# Patient Record
Sex: Female | Born: 1945
Health system: Southern US, Community
[De-identification: ages and names within clinical notes are randomized; demographics above are authoritative.]

## PROBLEM LIST (undated history)

## (undated) DIAGNOSIS — M791 Myalgia, unspecified site: Secondary | ICD-10-CM

## (undated) DIAGNOSIS — E079 Disorder of thyroid, unspecified: Secondary | ICD-10-CM

## (undated) DIAGNOSIS — I639 Cerebral infarction, unspecified: Secondary | ICD-10-CM

## (undated) DIAGNOSIS — E785 Hyperlipidemia, unspecified: Secondary | ICD-10-CM

## (undated) DIAGNOSIS — M069 Rheumatoid arthritis, unspecified: Secondary | ICD-10-CM

## (undated) DIAGNOSIS — F32A Depression, unspecified: Secondary | ICD-10-CM

## (undated) DIAGNOSIS — E538 Deficiency of other specified B group vitamins: Secondary | ICD-10-CM

## (undated) DIAGNOSIS — M359 Systemic involvement of connective tissue, unspecified: Secondary | ICD-10-CM

## (undated) DIAGNOSIS — F329 Major depressive disorder, single episode, unspecified: Secondary | ICD-10-CM

## (undated) DIAGNOSIS — G629 Polyneuropathy, unspecified: Secondary | ICD-10-CM

## (undated) DIAGNOSIS — I509 Heart failure, unspecified: Secondary | ICD-10-CM

## (undated) HISTORY — DX: Myalgia, unspecified site: M79.10

## (undated) HISTORY — DX: Depression, unspecified: F32.A

## (undated) HISTORY — PX: ECTOPIC PREGNANCY SURGERY: SHX613

## (undated) HISTORY — DX: Cerebral infarction, unspecified: I63.9

## (undated) HISTORY — DX: Deficiency of other specified B group vitamins: E53.8

## (undated) HISTORY — DX: Major depressive disorder, single episode, unspecified: F32.9

## (undated) HISTORY — DX: Rheumatoid arthritis, unspecified: M06.9

## (undated) HISTORY — PX: ABDOMINAL HYSTERECTOMY: SHX81

## (undated) HISTORY — PX: KNEE SURGERY: SHX244

## (undated) HISTORY — DX: Heart failure, unspecified: I50.9

## (undated) HISTORY — PX: LAMINECTOMY: SHX219

## (undated) HISTORY — PX: TONSILLECTOMY: SHX5217

## (undated) HISTORY — PX: BREAST BIOPSY: SHX20

## (undated) HISTORY — DX: Disorder of thyroid, unspecified: E07.9

## (undated) HISTORY — DX: Polyneuropathy, unspecified: G62.9

## (undated) HISTORY — PX: OTHER SURGICAL HISTORY: SHX169

---

## 2004-01-12 ENCOUNTER — Other Ambulatory Visit: Payer: Self-pay

## 2004-01-12 ENCOUNTER — Emergency Department: Payer: Self-pay | Admitting: Emergency Medicine

## 2004-05-08 ENCOUNTER — Ambulatory Visit: Payer: Self-pay | Admitting: Orthopedic Surgery

## 2004-05-17 ENCOUNTER — Ambulatory Visit: Payer: Self-pay | Admitting: Rheumatology

## 2004-07-17 ENCOUNTER — Ambulatory Visit: Payer: Self-pay | Admitting: Rheumatology

## 2004-11-02 ENCOUNTER — Ambulatory Visit: Payer: Self-pay | Admitting: Rheumatology

## 2005-01-01 ENCOUNTER — Ambulatory Visit: Payer: Self-pay | Admitting: Vascular Surgery

## 2005-02-01 ENCOUNTER — Ambulatory Visit: Payer: Self-pay

## 2005-08-28 ENCOUNTER — Ambulatory Visit: Payer: Self-pay | Admitting: Internal Medicine

## 2005-11-04 ENCOUNTER — Ambulatory Visit: Payer: Self-pay | Admitting: Otolaryngology

## 2006-02-27 ENCOUNTER — Ambulatory Visit: Payer: Self-pay | Admitting: Internal Medicine

## 2006-04-21 ENCOUNTER — Encounter: Payer: Self-pay | Admitting: Rheumatology

## 2006-05-02 ENCOUNTER — Encounter: Payer: Self-pay | Admitting: Rheumatology

## 2007-01-29 ENCOUNTER — Ambulatory Visit: Payer: Self-pay | Admitting: Rheumatology

## 2007-05-06 ENCOUNTER — Ambulatory Visit: Payer: Self-pay | Admitting: Internal Medicine

## 2007-11-20 ENCOUNTER — Ambulatory Visit: Payer: Self-pay | Admitting: Rheumatology

## 2008-01-18 ENCOUNTER — Ambulatory Visit: Payer: Self-pay | Admitting: Rheumatology

## 2008-02-26 ENCOUNTER — Ambulatory Visit: Payer: Self-pay | Admitting: Internal Medicine

## 2008-06-21 ENCOUNTER — Ambulatory Visit: Payer: Self-pay | Admitting: Rheumatology

## 2009-05-22 ENCOUNTER — Ambulatory Visit: Payer: Self-pay

## 2010-11-13 ENCOUNTER — Ambulatory Visit: Payer: Self-pay | Admitting: Internal Medicine

## 2010-11-15 ENCOUNTER — Ambulatory Visit: Payer: Self-pay | Admitting: Internal Medicine

## 2011-02-05 ENCOUNTER — Ambulatory Visit: Payer: Self-pay | Admitting: Internal Medicine

## 2011-05-27 ENCOUNTER — Ambulatory Visit: Payer: Self-pay | Admitting: General Surgery

## 2011-11-18 ENCOUNTER — Ambulatory Visit: Payer: Self-pay | Admitting: Internal Medicine

## 2013-06-07 ENCOUNTER — Ambulatory Visit: Payer: Self-pay | Admitting: Internal Medicine

## 2013-09-16 ENCOUNTER — Encounter: Payer: Self-pay | Admitting: Podiatry

## 2013-09-20 ENCOUNTER — Encounter: Payer: Self-pay | Admitting: Podiatry

## 2013-09-20 ENCOUNTER — Other Ambulatory Visit: Payer: Self-pay | Admitting: *Deleted

## 2013-09-20 ENCOUNTER — Ambulatory Visit (INDEPENDENT_AMBULATORY_CARE_PROVIDER_SITE_OTHER): Payer: Medicare Other | Admitting: Podiatry

## 2013-09-20 ENCOUNTER — Ambulatory Visit (INDEPENDENT_AMBULATORY_CARE_PROVIDER_SITE_OTHER): Payer: Medicare Other

## 2013-09-20 ENCOUNTER — Encounter: Payer: Self-pay | Admitting: *Deleted

## 2013-09-20 VITALS — BP 120/79 | HR 67 | Resp 16 | Ht 65.0 in | Wt 155.0 lb

## 2013-09-20 DIAGNOSIS — M21612 Bunion of left foot: Secondary | ICD-10-CM

## 2013-09-20 DIAGNOSIS — M778 Other enthesopathies, not elsewhere classified: Secondary | ICD-10-CM

## 2013-09-20 DIAGNOSIS — M779 Enthesopathy, unspecified: Secondary | ICD-10-CM

## 2013-09-20 DIAGNOSIS — M7750 Other enthesopathy of unspecified foot: Secondary | ICD-10-CM

## 2013-09-20 DIAGNOSIS — G5781 Other specified mononeuropathies of right lower limb: Secondary | ICD-10-CM

## 2013-09-20 DIAGNOSIS — G5760 Lesion of plantar nerve, unspecified lower limb: Secondary | ICD-10-CM

## 2013-09-20 DIAGNOSIS — M21619 Bunion of unspecified foot: Secondary | ICD-10-CM

## 2013-09-20 DIAGNOSIS — M21611 Bunion of right foot: Secondary | ICD-10-CM

## 2013-09-20 DIAGNOSIS — M059 Rheumatoid arthritis with rheumatoid factor, unspecified: Secondary | ICD-10-CM | POA: Insufficient documentation

## 2013-09-20 NOTE — Progress Notes (Signed)
She presents today complaining of bilateral foot pain. She states the right foot hurts right in here she points to the third and fourth toe and to the third interdigital space of the right foot. She's also stating that she has splaying between her toes and she states that she has pain on ambulation of the second knuckle a she points to the second metatarsophalangeal joint. She states that she's been treated by with Dr. Precious Reel full rheumatoid arthritis.  Objective: Vital signs are stable she is alert and oriented x3. Pulses are palpable bilateral. She has pain on a palpation to the third interdigital space of the right foot with a positive Mulder's click. This is associated with the neuroma. She also has a warm second metatarsophalangeal joint of the left foot with no erythema cellulitis drainage or odor associated with this. This very well could be a rheumatoid flare. She also has swelling in her ankles with mild erythema.  Assessment: Possible rheumatoid flare to the left foot second metatarsophalangeal joint. And bilateral ankles. She has a neuroma to the third interdigital space of the right foot.  Plan: Injected Kenalog into the third interdigital space of the right foot. Performed an extra-articular injection around the second metatarsophalangeal joint. Suggested that she followup with her rheumatologist immediately.

## 2013-11-01 ENCOUNTER — Ambulatory Visit (INDEPENDENT_AMBULATORY_CARE_PROVIDER_SITE_OTHER): Payer: Medicare Other | Admitting: Podiatry

## 2013-11-01 VITALS — BP 146/108 | HR 66 | Resp 16

## 2013-11-01 DIAGNOSIS — G5760 Lesion of plantar nerve, unspecified lower limb: Secondary | ICD-10-CM

## 2013-11-01 DIAGNOSIS — G5781 Other specified mononeuropathies of right lower limb: Secondary | ICD-10-CM

## 2013-11-01 NOTE — Progress Notes (Signed)
She presents today for followup of her foot pain bilateral. She states it seems to be doing much better.  Objective: Vital signs are stable she is alert and oriented x3. She is very prominent metatarsals with atrophy of the fat pad bilateral forefoot.  Assessment: Fat-pad atrophy metatarsalgia bilateral. Resolving neuritis and neuroma third interdigital space bilateral.  Plan: Followup with me as needed.

## 2013-11-25 DIAGNOSIS — R7303 Prediabetes: Secondary | ICD-10-CM | POA: Insufficient documentation

## 2013-11-25 DIAGNOSIS — R7309 Other abnormal glucose: Secondary | ICD-10-CM | POA: Insufficient documentation

## 2015-04-05 ENCOUNTER — Other Ambulatory Visit: Payer: Self-pay | Admitting: Internal Medicine

## 2015-04-05 DIAGNOSIS — Z1231 Encounter for screening mammogram for malignant neoplasm of breast: Secondary | ICD-10-CM

## 2015-04-05 DIAGNOSIS — E784 Other hyperlipidemia: Secondary | ICD-10-CM | POA: Diagnosis not present

## 2015-04-05 DIAGNOSIS — Z1211 Encounter for screening for malignant neoplasm of colon: Secondary | ICD-10-CM | POA: Diagnosis not present

## 2015-04-05 DIAGNOSIS — M150 Primary generalized (osteo)arthritis: Secondary | ICD-10-CM | POA: Diagnosis not present

## 2015-04-05 DIAGNOSIS — F3342 Major depressive disorder, recurrent, in full remission: Secondary | ICD-10-CM | POA: Diagnosis not present

## 2015-04-05 DIAGNOSIS — E034 Atrophy of thyroid (acquired): Secondary | ICD-10-CM | POA: Diagnosis not present

## 2015-04-05 DIAGNOSIS — M059 Rheumatoid arthritis with rheumatoid factor, unspecified: Secondary | ICD-10-CM | POA: Diagnosis not present

## 2015-04-05 DIAGNOSIS — Z1239 Encounter for other screening for malignant neoplasm of breast: Secondary | ICD-10-CM | POA: Diagnosis not present

## 2015-04-05 DIAGNOSIS — R7309 Other abnormal glucose: Secondary | ICD-10-CM | POA: Diagnosis not present

## 2015-04-13 ENCOUNTER — Ambulatory Visit: Payer: Self-pay

## 2015-04-21 DIAGNOSIS — Z1211 Encounter for screening for malignant neoplasm of colon: Secondary | ICD-10-CM | POA: Diagnosis not present

## 2015-05-08 DIAGNOSIS — R7309 Other abnormal glucose: Secondary | ICD-10-CM | POA: Diagnosis not present

## 2015-05-08 DIAGNOSIS — M150 Primary generalized (osteo)arthritis: Secondary | ICD-10-CM | POA: Diagnosis not present

## 2015-05-08 DIAGNOSIS — M0579 Rheumatoid arthritis with rheumatoid factor of multiple sites without organ or systems involvement: Secondary | ICD-10-CM | POA: Diagnosis not present

## 2015-05-08 DIAGNOSIS — G4733 Obstructive sleep apnea (adult) (pediatric): Secondary | ICD-10-CM | POA: Diagnosis not present

## 2015-05-08 DIAGNOSIS — F3342 Major depressive disorder, recurrent, in full remission: Secondary | ICD-10-CM | POA: Diagnosis not present

## 2015-05-08 DIAGNOSIS — E784 Other hyperlipidemia: Secondary | ICD-10-CM | POA: Diagnosis not present

## 2015-05-08 DIAGNOSIS — E034 Atrophy of thyroid (acquired): Secondary | ICD-10-CM | POA: Diagnosis not present

## 2015-05-10 ENCOUNTER — Ambulatory Visit
Admission: RE | Admit: 2015-05-10 | Discharge: 2015-05-10 | Disposition: A | Discharge status: Home / Self Care | Payer: PPO | Source: Ambulatory Visit | Attending: Internal Medicine | Admitting: Internal Medicine

## 2015-05-10 ENCOUNTER — Other Ambulatory Visit: Payer: Self-pay | Admitting: Internal Medicine

## 2015-05-10 DIAGNOSIS — Z1231 Encounter for screening mammogram for malignant neoplasm of breast: Secondary | ICD-10-CM | POA: Insufficient documentation

## 2015-05-24 DIAGNOSIS — I83811 Varicose veins of right lower extremities with pain: Secondary | ICD-10-CM | POA: Diagnosis not present

## 2015-05-24 DIAGNOSIS — M79609 Pain in unspecified limb: Secondary | ICD-10-CM | POA: Diagnosis not present

## 2015-05-24 DIAGNOSIS — I8311 Varicose veins of right lower extremity with inflammation: Secondary | ICD-10-CM | POA: Diagnosis not present

## 2015-05-25 DIAGNOSIS — M25561 Pain in right knee: Secondary | ICD-10-CM | POA: Diagnosis not present

## 2015-05-25 DIAGNOSIS — Z79899 Other long term (current) drug therapy: Secondary | ICD-10-CM | POA: Diagnosis not present

## 2015-05-25 DIAGNOSIS — G8929 Other chronic pain: Secondary | ICD-10-CM | POA: Diagnosis not present

## 2015-05-25 DIAGNOSIS — M0579 Rheumatoid arthritis with rheumatoid factor of multiple sites without organ or systems involvement: Secondary | ICD-10-CM | POA: Diagnosis not present

## 2015-06-20 DIAGNOSIS — M79609 Pain in unspecified limb: Secondary | ICD-10-CM | POA: Diagnosis not present

## 2015-06-20 DIAGNOSIS — I8311 Varicose veins of right lower extremity with inflammation: Secondary | ICD-10-CM | POA: Diagnosis not present

## 2015-06-20 DIAGNOSIS — I83811 Varicose veins of right lower extremities with pain: Secondary | ICD-10-CM | POA: Diagnosis not present

## 2015-07-21 DIAGNOSIS — M79609 Pain in unspecified limb: Secondary | ICD-10-CM | POA: Diagnosis not present

## 2015-07-21 DIAGNOSIS — I83811 Varicose veins of right lower extremities with pain: Secondary | ICD-10-CM | POA: Diagnosis not present

## 2015-07-21 DIAGNOSIS — I83813 Varicose veins of bilateral lower extremities with pain: Secondary | ICD-10-CM | POA: Diagnosis not present

## 2015-07-21 DIAGNOSIS — I8311 Varicose veins of right lower extremity with inflammation: Secondary | ICD-10-CM | POA: Diagnosis not present

## 2015-09-15 DIAGNOSIS — I8311 Varicose veins of right lower extremity with inflammation: Secondary | ICD-10-CM | POA: Diagnosis not present

## 2015-09-15 DIAGNOSIS — M79609 Pain in unspecified limb: Secondary | ICD-10-CM | POA: Diagnosis not present

## 2015-09-15 DIAGNOSIS — I83813 Varicose veins of bilateral lower extremities with pain: Secondary | ICD-10-CM | POA: Diagnosis not present

## 2015-09-15 DIAGNOSIS — I83811 Varicose veins of right lower extremities with pain: Secondary | ICD-10-CM | POA: Diagnosis not present

## 2015-10-27 DIAGNOSIS — E034 Atrophy of thyroid (acquired): Secondary | ICD-10-CM | POA: Diagnosis not present

## 2015-10-27 DIAGNOSIS — E784 Other hyperlipidemia: Secondary | ICD-10-CM | POA: Diagnosis not present

## 2015-10-27 DIAGNOSIS — M150 Primary generalized (osteo)arthritis: Secondary | ICD-10-CM | POA: Diagnosis not present

## 2015-11-03 DIAGNOSIS — M150 Primary generalized (osteo)arthritis: Secondary | ICD-10-CM | POA: Diagnosis not present

## 2015-11-03 DIAGNOSIS — G4733 Obstructive sleep apnea (adult) (pediatric): Secondary | ICD-10-CM | POA: Diagnosis not present

## 2015-11-03 DIAGNOSIS — M0579 Rheumatoid arthritis with rheumatoid factor of multiple sites without organ or systems involvement: Secondary | ICD-10-CM | POA: Diagnosis not present

## 2015-11-03 DIAGNOSIS — F3341 Major depressive disorder, recurrent, in partial remission: Secondary | ICD-10-CM | POA: Diagnosis not present

## 2015-11-03 DIAGNOSIS — E784 Other hyperlipidemia: Secondary | ICD-10-CM | POA: Diagnosis not present

## 2015-11-03 DIAGNOSIS — R7309 Other abnormal glucose: Secondary | ICD-10-CM | POA: Diagnosis not present

## 2015-11-03 DIAGNOSIS — E034 Atrophy of thyroid (acquired): Secondary | ICD-10-CM | POA: Diagnosis not present

## 2015-11-23 DIAGNOSIS — M65341 Trigger finger, right ring finger: Secondary | ICD-10-CM | POA: Diagnosis not present

## 2015-11-23 DIAGNOSIS — M0579 Rheumatoid arthritis with rheumatoid factor of multiple sites without organ or systems involvement: Secondary | ICD-10-CM | POA: Diagnosis not present

## 2015-11-23 DIAGNOSIS — Z79899 Other long term (current) drug therapy: Secondary | ICD-10-CM | POA: Diagnosis not present

## 2016-01-30 DIAGNOSIS — E034 Atrophy of thyroid (acquired): Secondary | ICD-10-CM | POA: Diagnosis not present

## 2016-01-30 DIAGNOSIS — R7309 Other abnormal glucose: Secondary | ICD-10-CM | POA: Diagnosis not present

## 2016-01-30 DIAGNOSIS — M0579 Rheumatoid arthritis with rheumatoid factor of multiple sites without organ or systems involvement: Secondary | ICD-10-CM | POA: Diagnosis not present

## 2016-01-30 DIAGNOSIS — R300 Dysuria: Secondary | ICD-10-CM | POA: Diagnosis not present

## 2016-01-30 DIAGNOSIS — M150 Primary generalized (osteo)arthritis: Secondary | ICD-10-CM | POA: Diagnosis not present

## 2016-01-30 DIAGNOSIS — N390 Urinary tract infection, site not specified: Secondary | ICD-10-CM | POA: Diagnosis not present

## 2016-01-30 DIAGNOSIS — R030 Elevated blood-pressure reading, without diagnosis of hypertension: Secondary | ICD-10-CM | POA: Diagnosis not present

## 2016-02-06 DIAGNOSIS — F3341 Major depressive disorder, recurrent, in partial remission: Secondary | ICD-10-CM | POA: Diagnosis not present

## 2016-02-06 DIAGNOSIS — Z23 Encounter for immunization: Secondary | ICD-10-CM | POA: Diagnosis not present

## 2016-02-06 DIAGNOSIS — E784 Other hyperlipidemia: Secondary | ICD-10-CM | POA: Diagnosis not present

## 2016-02-06 DIAGNOSIS — E034 Atrophy of thyroid (acquired): Secondary | ICD-10-CM | POA: Diagnosis not present

## 2016-02-06 DIAGNOSIS — G4733 Obstructive sleep apnea (adult) (pediatric): Secondary | ICD-10-CM | POA: Diagnosis not present

## 2016-02-06 DIAGNOSIS — R7309 Other abnormal glucose: Secondary | ICD-10-CM | POA: Diagnosis not present

## 2016-02-06 DIAGNOSIS — M150 Primary generalized (osteo)arthritis: Secondary | ICD-10-CM | POA: Diagnosis not present

## 2016-02-06 DIAGNOSIS — M0579 Rheumatoid arthritis with rheumatoid factor of multiple sites without organ or systems involvement: Secondary | ICD-10-CM | POA: Diagnosis not present

## 2016-04-22 DIAGNOSIS — E034 Atrophy of thyroid (acquired): Secondary | ICD-10-CM | POA: Diagnosis not present

## 2016-04-22 DIAGNOSIS — E784 Other hyperlipidemia: Secondary | ICD-10-CM | POA: Diagnosis not present

## 2016-04-22 DIAGNOSIS — G4733 Obstructive sleep apnea (adult) (pediatric): Secondary | ICD-10-CM | POA: Diagnosis not present

## 2016-04-22 DIAGNOSIS — M0579 Rheumatoid arthritis with rheumatoid factor of multiple sites without organ or systems involvement: Secondary | ICD-10-CM | POA: Diagnosis not present

## 2016-04-22 DIAGNOSIS — R7309 Other abnormal glucose: Secondary | ICD-10-CM | POA: Diagnosis not present

## 2016-05-01 ENCOUNTER — Other Ambulatory Visit: Payer: Self-pay | Admitting: Internal Medicine

## 2016-05-01 DIAGNOSIS — Z1231 Encounter for screening mammogram for malignant neoplasm of breast: Secondary | ICD-10-CM | POA: Diagnosis not present

## 2016-05-01 DIAGNOSIS — M0579 Rheumatoid arthritis with rheumatoid factor of multiple sites without organ or systems involvement: Secondary | ICD-10-CM | POA: Diagnosis not present

## 2016-05-01 DIAGNOSIS — R7309 Other abnormal glucose: Secondary | ICD-10-CM | POA: Diagnosis not present

## 2016-05-01 DIAGNOSIS — E784 Other hyperlipidemia: Secondary | ICD-10-CM | POA: Diagnosis not present

## 2016-05-01 DIAGNOSIS — E034 Atrophy of thyroid (acquired): Secondary | ICD-10-CM | POA: Diagnosis not present

## 2016-05-01 DIAGNOSIS — Z0000 Encounter for general adult medical examination without abnormal findings: Secondary | ICD-10-CM | POA: Diagnosis not present

## 2016-05-01 DIAGNOSIS — G4733 Obstructive sleep apnea (adult) (pediatric): Secondary | ICD-10-CM | POA: Diagnosis not present

## 2016-05-01 DIAGNOSIS — F3341 Major depressive disorder, recurrent, in partial remission: Secondary | ICD-10-CM | POA: Diagnosis not present

## 2016-05-01 DIAGNOSIS — M150 Primary generalized (osteo)arthritis: Secondary | ICD-10-CM | POA: Diagnosis not present

## 2016-06-11 ENCOUNTER — Ambulatory Visit: Payer: PPO

## 2016-07-17 ENCOUNTER — Emergency Department: Payer: PPO

## 2016-07-17 ENCOUNTER — Encounter: Payer: Self-pay | Admitting: Emergency Medicine

## 2016-07-17 ENCOUNTER — Inpatient Hospital Stay
Admission: EM | Admit: 2016-07-17 | Discharge: 2016-07-19 | DRG: 066 | Disposition: A | Discharge status: Home Health | Payer: PPO | Attending: Specialist | Admitting: Specialist

## 2016-07-17 DIAGNOSIS — I10 Essential (primary) hypertension: Secondary | ICD-10-CM | POA: Diagnosis not present

## 2016-07-17 DIAGNOSIS — F329 Major depressive disorder, single episode, unspecified: Secondary | ICD-10-CM | POA: Diagnosis not present

## 2016-07-17 DIAGNOSIS — Z90710 Acquired absence of both cervix and uterus: Secondary | ICD-10-CM | POA: Diagnosis not present

## 2016-07-17 DIAGNOSIS — I739 Peripheral vascular disease, unspecified: Secondary | ICD-10-CM | POA: Diagnosis present

## 2016-07-17 DIAGNOSIS — E785 Hyperlipidemia, unspecified: Secondary | ICD-10-CM | POA: Diagnosis present

## 2016-07-17 DIAGNOSIS — E039 Hypothyroidism, unspecified: Secondary | ICD-10-CM | POA: Diagnosis present

## 2016-07-17 DIAGNOSIS — Z7982 Long term (current) use of aspirin: Secondary | ICD-10-CM

## 2016-07-17 DIAGNOSIS — R29701 NIHSS score 1: Secondary | ICD-10-CM | POA: Diagnosis not present

## 2016-07-17 DIAGNOSIS — G629 Polyneuropathy, unspecified: Secondary | ICD-10-CM | POA: Diagnosis not present

## 2016-07-17 DIAGNOSIS — I639 Cerebral infarction, unspecified: Secondary | ICD-10-CM

## 2016-07-17 DIAGNOSIS — I6789 Other cerebrovascular disease: Secondary | ICD-10-CM | POA: Diagnosis not present

## 2016-07-17 DIAGNOSIS — R51 Headache: Secondary | ICD-10-CM | POA: Diagnosis not present

## 2016-07-17 DIAGNOSIS — Z23 Encounter for immunization: Secondary | ICD-10-CM

## 2016-07-17 DIAGNOSIS — M069 Rheumatoid arthritis, unspecified: Secondary | ICD-10-CM | POA: Diagnosis present

## 2016-07-17 DIAGNOSIS — E538 Deficiency of other specified B group vitamins: Secondary | ICD-10-CM | POA: Diagnosis not present

## 2016-07-17 DIAGNOSIS — I6350 Cerebral infarction due to unspecified occlusion or stenosis of unspecified cerebral artery: Principal | ICD-10-CM | POA: Diagnosis present

## 2016-07-17 DIAGNOSIS — R200 Anesthesia of skin: Secondary | ICD-10-CM | POA: Diagnosis not present

## 2016-07-17 DIAGNOSIS — Z885 Allergy status to narcotic agent status: Secondary | ICD-10-CM | POA: Diagnosis not present

## 2016-07-17 DIAGNOSIS — Z79899 Other long term (current) drug therapy: Secondary | ICD-10-CM | POA: Diagnosis not present

## 2016-07-17 DIAGNOSIS — F419 Anxiety disorder, unspecified: Secondary | ICD-10-CM | POA: Diagnosis present

## 2016-07-17 DIAGNOSIS — G8321 Monoplegia of upper limb affecting right dominant side: Secondary | ICD-10-CM | POA: Diagnosis present

## 2016-07-17 DIAGNOSIS — I63233 Cerebral infarction due to unspecified occlusion or stenosis of bilateral carotid arteries: Secondary | ICD-10-CM | POA: Diagnosis not present

## 2016-07-17 DIAGNOSIS — G459 Transient cerebral ischemic attack, unspecified: Secondary | ICD-10-CM | POA: Diagnosis not present

## 2016-07-17 HISTORY — DX: Hyperlipidemia, unspecified: E78.5

## 2016-07-17 LAB — CBC
HCT: 45.8 % (ref 35.0–47.0)
Hemoglobin: 15.2 g/dL (ref 12.0–16.0)
MCH: 31.5 pg (ref 26.0–34.0)
MCHC: 33.1 g/dL (ref 32.0–36.0)
MCV: 95.2 fL (ref 80.0–100.0)
PLATELETS: 294 10*3/uL (ref 150–440)
RBC: 4.81 MIL/uL (ref 3.80–5.20)
RDW: 14.5 % (ref 11.5–14.5)
WBC: 6.8 10*3/uL (ref 3.6–11.0)

## 2016-07-17 LAB — COMPREHENSIVE METABOLIC PANEL
ALBUMIN: 3.9 g/dL (ref 3.5–5.0)
ALK PHOS: 98 U/L (ref 38–126)
ALT: 10 U/L — ABNORMAL LOW (ref 14–54)
AST: 20 U/L (ref 15–41)
Anion gap: 9 (ref 5–15)
BILIRUBIN TOTAL: 0.7 mg/dL (ref 0.3–1.2)
BUN: 9 mg/dL (ref 6–20)
CALCIUM: 9.1 mg/dL (ref 8.9–10.3)
CO2: 24 mmol/L (ref 22–32)
CREATININE: 0.79 mg/dL (ref 0.44–1.00)
Chloride: 108 mmol/L (ref 101–111)
GFR calc Af Amer: >60 mL/min (ref >60)
GFR calc non Af Amer: >60 mL/min (ref >60)
GLUCOSE: 136 mg/dL — AB (ref 65–99)
Potassium: 3.4 mmol/L — ABNORMAL LOW (ref 3.5–5.1)
Sodium: 141 mmol/L (ref 135–145)
TOTAL PROTEIN: 7.5 g/dL (ref 6.5–8.1)

## 2016-07-17 LAB — PROTIME-INR
INR: 0.96
PROTHROMBIN TIME: 12.8 s (ref 11.4–15.2)

## 2016-07-17 LAB — APTT: APTT: 31 s (ref 24–36)

## 2016-07-17 LAB — DIFFERENTIAL
Basophils Absolute: 0.1 10*3/uL (ref 0–0.1)
Basophils Relative: 1 %
Eosinophils Absolute: 0.1 10*3/uL (ref 0–0.7)
Eosinophils Relative: 1 %
LYMPHS PCT: 24 %
Lymphs Abs: 1.6 10*3/uL (ref 1.0–3.6)
Monocytes Absolute: 0.5 10*3/uL (ref 0.2–0.9)
Monocytes Relative: 7 %
NEUTROS PCT: 67 %
Neutro Abs: 4.6 10*3/uL (ref 1.4–6.5)

## 2016-07-17 LAB — TROPONIN I: Troponin I: <0.03 ng/mL (ref <0.03)

## 2016-07-17 MED ORDER — HYDRALAZINE HCL 20 MG/ML IJ SOLN
10.0000 mg | Freq: Four times a day (QID) | INTRAMUSCULAR | Status: DC | PRN
  Administered 2016-07-17: 10 mg via INTRAVENOUS
  Filled 2016-07-17 (×2): qty 1

## 2016-07-17 MED ORDER — LAMOTRIGINE 25 MG PO TABS
25.0000 mg | ORAL_TABLET | Freq: Once | ORAL | Status: AC
  Administered 2016-07-17: 23:00:00 25 mg via ORAL
  Filled 2016-07-17: qty 1

## 2016-07-17 MED ORDER — PAROXETINE HCL 20 MG PO TABS
20.0000 mg | ORAL_TABLET | Freq: Two times a day (BID) | ORAL | Status: DC
  Administered 2016-07-18: 20 mg via ORAL
  Filled 2016-07-17 (×3): qty 1

## 2016-07-17 MED ORDER — ATORVASTATIN CALCIUM 20 MG PO TABS
40.0000 mg | ORAL_TABLET | Freq: Every day | ORAL | Status: DC
  Administered 2016-07-18: 19:00:00 40 mg via ORAL
  Filled 2016-07-17: qty 2

## 2016-07-17 MED ORDER — LAMOTRIGINE 25 MG PO TABS
25.0000 mg | ORAL_TABLET | Freq: Every day | ORAL | Status: DC
  Filled 2016-07-17: qty 1

## 2016-07-17 MED ORDER — PAROXETINE HCL 20 MG PO TABS
20.0000 mg | ORAL_TABLET | Freq: Once | ORAL | Status: AC
Start: 2016-07-17 — End: 2016-07-17
  Administered 2016-07-17: 23:00:00 20 mg via ORAL
  Filled 2016-07-17: qty 1

## 2016-07-17 MED ORDER — CLONAZEPAM 0.5 MG PO TABS
0.5000 mg | ORAL_TABLET | Freq: Two times a day (BID) | ORAL | Status: DC | PRN
  Filled 2016-07-17: qty 1

## 2016-07-17 MED ORDER — ASPIRIN EC 81 MG PO TBEC
81.0000 mg | DELAYED_RELEASE_TABLET | Freq: Every day | ORAL | Status: DC
  Administered 2016-07-18 – 2016-07-19 (×2): 81 mg via ORAL
  Filled 2016-07-17 (×2): qty 1

## 2016-07-17 MED ORDER — SODIUM CHLORIDE 0.9% FLUSH: 3.0000 mL | INTRAVENOUS | Status: DC | PRN

## 2016-07-17 MED ORDER — ONDANSETRON HCL 4 MG/2ML IJ SOLN
4.0000 mg | Freq: Four times a day (QID) | INTRAMUSCULAR | Status: DC | PRN
  Administered 2016-07-18: 10:00:00 4 mg via INTRAVENOUS
  Filled 2016-07-17: qty 2

## 2016-07-17 MED ORDER — LEVOTHYROXINE SODIUM 112 MCG PO TABS
112.0000 ug | ORAL_TABLET | Freq: Every day | ORAL | Status: DC
  Administered 2016-07-18 – 2016-07-19 (×2): 112 ug via ORAL
  Filled 2016-07-17 (×2): qty 1

## 2016-07-17 MED ORDER — PNEUMOCOCCAL VAC POLYVALENT 25 MCG/0.5ML IJ INJ
0.5000 mL | INJECTION | INTRAMUSCULAR | Status: AC
  Administered 2016-07-18: 13:00:00 0.5 mL via INTRAMUSCULAR
  Filled 2016-07-17: qty 0.5

## 2016-07-17 MED ORDER — SODIUM CHLORIDE 0.9% FLUSH
3.0000 mL | Freq: Two times a day (BID) | INTRAVENOUS | Status: DC
  Administered 2016-07-18: 22:00:00 3 mL via INTRAVENOUS

## 2016-07-17 MED ORDER — ACETAMINOPHEN 650 MG RE SUPP: 650.0000 mg | Freq: Four times a day (QID) | RECTAL | Status: DC | PRN

## 2016-07-17 MED ORDER — ACETAMINOPHEN 325 MG PO TABS
650.0000 mg | ORAL_TABLET | Freq: Four times a day (QID) | ORAL | Status: DC | PRN
  Administered 2016-07-18: 650 mg via ORAL
  Filled 2016-07-17: qty 2

## 2016-07-17 MED ORDER — POLYETHYLENE GLYCOL 3350 17 G PO PACK: 17.0000 g | PACK | Freq: Every day | ORAL | Status: DC | PRN

## 2016-07-17 MED ORDER — SODIUM CHLORIDE 0.9% FLUSH
3.0000 mL | Freq: Two times a day (BID) | INTRAVENOUS | Status: DC
  Administered 2016-07-17 – 2016-07-19 (×4): 3 mL via INTRAVENOUS

## 2016-07-17 MED ORDER — ASPIRIN 81 MG PO CHEW
324.0000 mg | CHEWABLE_TABLET | Freq: Once | ORAL | Status: AC
  Administered 2016-07-17: 324 mg via ORAL
  Filled 2016-07-17: qty 4

## 2016-07-17 MED ORDER — ONDANSETRON HCL 4 MG PO TABS: 4.0000 mg | ORAL_TABLET | Freq: Four times a day (QID) | ORAL | Status: DC | PRN

## 2016-07-17 MED ORDER — ENOXAPARIN SODIUM 40 MG/0.4ML ~~LOC~~ SOLN
40.0000 mg | SUBCUTANEOUS | Status: DC
  Administered 2016-07-17 – 2016-07-18 (×2): 40 mg via SUBCUTANEOUS
  Filled 2016-07-17 (×2): qty 0.4

## 2016-07-17 MED ORDER — SODIUM CHLORIDE 0.9 % IV SOLN: 250.0000 mL | INTRAVENOUS | Status: DC | PRN

## 2016-07-17 NOTE — ED Provider Notes (Signed)
[facility] Emergency Department Provider Note  Time seen: 3:55 PM  I have reviewed the triage vital signs and the nursing notes.   HISTORY  Chief Complaint Dizziness and Numbness    HPI [name] is a 71 y.o. female with a past medical history of depression, neuropathy, rheumatoid arthritis, who presents to the emergency department with right hand numbness since yesterday. According to the patient since around 5 PM yesterday she has a feeling of numbness in the right hand and feels off balance. States she is able to walk but she has to hold onto the wall while she walks which is new for her. Denies any known leg weakness or numbness. Denies headache. Husband states at some point yesterday evening her speech sounded slurred but that has largely recovered. No history of CVA in the past. Patient does take a daily 81 mg aspirin but did not take it today.  Past Medical History:  Diagnosis Date  . Depression   . Muscle pain   . Neuropathy   . RA (rheumatoid arthritis) ([place])   . Thyroid disease   . Vitamin B 12 deficiency     Patient Active Problem List   Diagnosis Date Noted  . Rheumatoid arthritis with rheumatoid factor ([place]) 09/20/2013    Past Surgical History:  Procedure Laterality Date  . ABDOMINAL HYSTERECTOMY    . BREAST BIOPSY Left    2 benign biopsies  . ECTOPIC PREGNANCY SURGERY    . KNEE SURGERY Right   . LAMINECTOMY    . leg vein stripping Bilateral   . TONSILLECTOMY      Prior to Admission medications   Medication Sig Start Date End Date Taking? Authorizing Provider  Adalimumab (HUMIRA) 40 MG/0.8ML PSKT Inject into the skin.    Historical Provider, MD  aspirin EC 81 MG tablet Take by mouth.    Historical Provider, MD  clonazePAM (KLONOPIN) 0.5 MG tablet  06/20/13   Historical Provider, MD  clonazePAM (KLONOPIN) 0.5 MG tablet Take by mouth.    Historical Provider, MD  folic acid (FOLVITE) 1 MG tablet Take 1 mg by mouth daily.     Historical Provider, MD  folic acid (FOLVITE) 1 MG tablet Take by mouth.    Historical Provider, MD  levothyroxine (SYNTHROID, LEVOTHROID) 50 MCG tablet Take 50 mcg by mouth daily before breakfast.    Historical Provider, MD  levothyroxine (SYNTHROID, LEVOTHROID) 88 MCG tablet Take by mouth.    Historical Provider, MD  methotrexate (RHEUMATREX) 2.5 MG tablet Take by mouth.    Historical Provider, MD  methotrexate (RHEUMATREX) 5 MG tablet Take 5 mg by mouth once a week. Caution: Chemotherapy. Protect from light. Take 8 per week    Historical Provider, MD  Multiple Vitamin (MULTIVITAMIN) tablet Take 1 tablet by mouth daily.    Historical Provider, MD  PARoxetine (PAXIL-CR) 25 MG 24 hr tablet  08/24/13   Historical Provider, MD  VITAMIN D, ERGOCALCIFEROL, PO Take by mouth daily.    Historical Provider, MD    Allergies  Allergen Reactions  . Codeine Nausea And Vomiting and Other (See Comments)  . Demerol [Meperidine] Nausea And Vomiting    Family History  Problem Relation Age of Onset  . COPD Father   . Cancer Father   . Breast cancer Cousin   . Breast cancer Maternal Aunt   . Breast cancer Maternal Aunt     Social History Social History  Substance Use Topics  . Smoking status: Never Smoker  .  Smokeless tobacco: Never Used  . Alcohol use Not on file    Review of Systems Constitutional: Negative for fever Cardiovascular: Negative for chest pain. Respiratory: Negative for shortness of breath. Gastrointestinal: Negative for abdominal pain Neurological: Positive for right hand numbness. Positive for feeling off balance. 10-point ROS otherwise negative.  ____________________________________________   PHYSICAL EXAM:  VITAL SIGNS: ED Triage Vitals  Enc Vitals Group     BP 07/17/16 1414 (!) 158/87     Pulse Rate 07/17/16 1414 (!) 58     Resp 07/17/16 1414 16     Temp 07/17/16 1414 98 F (36.7 C)     Temp Source 07/17/16 1414 Oral     SpO2 07/17/16 1414 96 %     Weight  07/17/16 1411 165 lb (74.8 kg)     Height 07/17/16 1411 5\' 5"  (1.651 m)     Head Circumference --      Peak Flow --      Pain Score 07/17/16 1411 0     Pain Loc --      Pain Edu? --      Excl. in [place]? --     Constitutional: Alert and oriented. Well appearing  Eyes: Normal exam ENT   Head: Normocephalic and atraumatic.   Mouth/Throat: Mucous membranes are moist. Cardiovascular: Normal rate, regular rhythm. No murmur Respiratory: Normal respiratory effort without tachypnea nor retractions. Breath sounds are clear  Gastrointestinal: Soft and nontender. No distention.   Musculoskeletal: Nontender with normal range of motion in all extremities. Neurologic:  Normal speech and language. Patient does have a slight right pronator drift on exam. No appreciable cranial nerve deficits. Patient does states subjective numbness of the right hand and forearm. Patient is not able to maintain her right lower extremity in an elevated position for more than 3 or 4 seconds. No issues with the left lower extremity. Skin:  Skin is warm, dry and intact.  Psychiatric: Mood and affect are normal.  ____________________________________________    EKG  EKG reviewed and interpreted, so shows normal sinus rhythm at 62 bpm, narrow QRS, normal axis, normal intervals, nonspecific but no concerning ST changes.  ____________________________________________    RADIOLOGY  CT negative  ____________________________________________   INITIAL IMPRESSION / ASSESSMENT AND PLAN / ED COURSE  Pertinent labs & imaging results that were available during my care of the patient were reviewed by me and considered in my medical decision making (see chart for details).  Patient presents to the emergency department with neurologic deficits which began around 5 PM yesterday. Patient is well outside of any TPA window. She continues to have symptoms especially right lower extremity and right upper extremity  weakness/numbness as appreciated in the right lower extremity on exam. Patient was unaware of any right lower extremity weakness prior to the examination. Patient's CT scan is negative, labs are normal, EKG sinus rhythm. High suspicion for CVA we'll obtain an MRI to further evaluate.  NIH Stroke Scale   Interval: Baseline Time: 4:01 PM Person Administering Scale: Breya Cass  Administer stroke scale items in the order listed. Record performance in each category after each subscale exam. Do not go back and change scores. Follow directions provided for each exam technique. Scores should reflect what the patient does, not what the clinician thinks the patient can do. The clinician should record answers while administering the exam and work quickly. Except where indicated, the patient should not be coached (i.e., repeated requests to patient to make a special effort).  1a  Level of consciousness: 0=alert; keenly responsive  1b. LOC questions:  0=Performs both tasks correctly  1c. LOC commands: 0=Performs both tasks correctly  2.  Best Gaze: 0=normal  3.  Visual: 0=No visual loss  4. Facial Palsy: 0=Normal symmetric movement  5a.  Motor left arm: 0=No drift, limb holds 90 (or 45) degrees for full 10 seconds  5b.  Motor right arm: 1=Drift, limb holds 90 (or 45) degrees but drifts down before full 10 seconds: does not hit bed  6a. motor left leg: 0=No drift, limb holds 90 (or 45) degrees for full 10 seconds  6b  Motor right leg:  2=Some effort against gravity, limb cannot get to or maintain (if cured) 90 (or 45) degrees, drifts down to bed, but has some effort against gravity  7. Limb Ataxia: 1=Present in one limb  8.  Sensory: 1=Mild to moderate sensory loss; patient feels pinprick is less sharp or is dull on the affected side; there is a loss of superficial pain with pinprick but patient is aware She is being touched  9. Best Language:  0=No aphasia, normal  10. Dysarthria: 0=Normal  11.  Extinction and Inattention: 0=No abnormality  12. Distal motor function: 0=Normal   Total:   5    MRI positive for acute CVA. Patient will be admitted for further workup. ____________________________________________   FINAL CLINICAL IMPRESSION(S) / ED DIAGNOSES  CVA    Harvest Dark, MD 07/17/16 [phone]

## 2016-07-17 NOTE — ED Triage Notes (Signed)
C/O numbness to right hand, feeling dizzy.  Onset of symptoms yesterday afternoon at around 1700.  Began feeling dizzy today at around 1300.

## 2016-07-17 NOTE — ED Notes (Signed)
Patient transported to MRI 

## 2016-07-17 NOTE — ED Notes (Signed)
Pt back from MRI, MD at bedside

## 2016-07-17 NOTE — H&P (Signed)
[name] NAME: [name]    MR#:  [id number]  DATE OF BIRTH:  1946/03/27  DATE OF ADMISSION:  07/17/2016  PRIMARY CARE PHYSICIAN: Adin Hector, MD   REQUESTING/REFERRING PHYSICIAN: Dr. Kerman Passey  CHIEF COMPLAINT:   Chief Complaint  Patient presents with  . Dizziness  . Numbness    HISTORY OF PRESENT ILLNESS:  [name]  is a 71 y.o. female with a known history of Hypertension, hypothyroidism presents to the emergency room complaining of problems with her right hand and arm since yesterday with weakness. No numbness. She has also noticed some problems with her gait with mild right lower extremity weakness. She had some speech problems yesterday which have resolved. No trouble swallowing. No change in vision. Ambulates on her own normally at baseline. Her blood pressure tends to run up to 481 systolic and medications have not been started yet as outpatient. Takes a baby aspirin every day. No history of atrial fibrillation.  PAST MEDICAL HISTORY:   Past Medical History:  Diagnosis Date  . Depression   . Hyperlipidemia   . Muscle pain   . Neuropathy   . RA (rheumatoid arthritis) ([place])   . Thyroid disease   . Vitamin B 12 deficiency     PAST SURGICAL HISTORY:   Past Surgical History:  Procedure Laterality Date  . ABDOMINAL HYSTERECTOMY    . BREAST BIOPSY Left    2 benign biopsies  . ECTOPIC PREGNANCY SURGERY    . KNEE SURGERY Right   . LAMINECTOMY    . leg vein stripping Bilateral   . TONSILLECTOMY      SOCIAL HISTORY:   Social History  Substance Use Topics  . Smoking status: Never Smoker  . Smokeless tobacco: Never Used  . Alcohol use Not on file    FAMILY HISTORY:   Family History  Problem Relation Age of Onset  . COPD Father   . Cancer Father   . Breast cancer Cousin   . Breast cancer Maternal Aunt   . Breast cancer Maternal Aunt     DRUG ALLERGIES:   Allergies  Allergen  Reactions  . Codeine Nausea And Vomiting and Other (See Comments)  . Demerol [Meperidine] Nausea And Vomiting    REVIEW OF SYSTEMS:   ROS  MEDICATIONS AT HOME:   Prior to Admission medications   Medication Sig Start Date End Date Taking? Authorizing Provider  Adalimumab (HUMIRA) 40 MG/0.8ML PSKT Inject into the skin.    Historical Provider, MD  aspirin EC 81 MG tablet Take by mouth.    Historical Provider, MD  clonazePAM (KLONOPIN) 0.5 MG tablet Take by mouth.    Historical Provider, MD  folic acid (FOLVITE) 1 MG tablet Take 1 mg by mouth daily.    Historical Provider, MD  lamoTRIgine (LAMICTAL) 25 MG tablet Take 25 mg by mouth daily. 06/20/16   Historical Provider, MD  levothyroxine (SYNTHROID, LEVOTHROID) 50 MCG tablet Take 50 mcg by mouth daily before breakfast.    Historical Provider, MD  levothyroxine (SYNTHROID, LEVOTHROID) 88 MCG tablet Take by mouth.    Historical Provider, MD  methotrexate (RHEUMATREX) 2.5 MG tablet Take by mouth.    Historical Provider, MD  methotrexate (RHEUMATREX) 5 MG tablet Take 5 mg by mouth once a week. Caution: Chemotherapy. Protect from light. Take 8 per week    Historical Provider, MD  Multiple Vitamin (MULTIVITAMIN) tablet Take 1 tablet by mouth daily.    Historical  Provider, MD  PARoxetine (PAXIL-CR) 25 MG 24 hr tablet  08/24/13   Historical Provider, MD  VITAMIN D, ERGOCALCIFEROL, PO Take by mouth daily.    Historical Provider, MD     VITAL SIGNS:  Blood pressure (!) 197/86, pulse (!) 48, temperature 98 F (36.7 C), temperature source Oral, resp. rate 19, height 5\' 5"  (1.651 m), weight 74.8 kg (165 lb), SpO2 96 %.  PHYSICAL EXAMINATION:  Physical Exam  GENERAL:  72 y.o.-year-old patient lying in the bed with no acute distress.  EYES: Pupils equal, round, reactive to light and accommodation. No scleral icterus. Extraocular muscles intact.  HEENT: Head atraumatic, normocephalic. Oropharynx and nasopharynx clear. No oropharyngeal erythema, moist  oral mucosa  NECK:  Supple, no jugular venous distention. No thyroid enlargement, no tenderness.  LUNGS: Normal breath sounds bilaterally, no wheezing, rales, rhonchi. No use of accessory muscles of respiration.  CARDIOVASCULAR: S1, S2 normal. No murmurs, rubs, or gallops.  ABDOMEN: Soft, nontender, nondistended. Bowel sounds present. No organomegaly or mass.  EXTREMITIES: No pedal edema, cyanosis, or clubbing. + 2 pedal & radial pulses b/l.   NEUROLOGIC: Cranial nerves II through XII are intact.  Right upper and lower extremities strength 4- over 5. Sensations intact all over. Gait not tested. Left motor strength normal PSYCHIATRIC: The patient is alert and oriented x 3. Good affect.  SKIN: No obvious rash, lesion, or ulcer.   LABORATORY PANEL:   CBC  Recent Labs Lab 07/17/16 1415  WBC 6.8  HGB 15.2  HCT 45.8  PLT 294   ------------------------------------------------------------------------------------------------------------------  Chemistries   Recent Labs Lab 07/17/16 1415  NA 141  K 3.4*  CL 108  CO2 24  GLUCOSE 136*  BUN 9  CREATININE 0.79  CALCIUM 9.1  AST 20  ALT 10*  ALKPHOS 98  BILITOT 0.7   ------------------------------------------------------------------------------------------------------------------  Cardiac Enzymes  Recent Labs Lab 07/17/16 1415  TROPONINI <0.03   ------------------------------------------------------------------------------------------------------------------  RADIOLOGY:  Ct Head Wo Contrast  Result Date: 07/17/2016 CLINICAL DATA:  Right hand numbness and dizziness. EXAM: CT HEAD WITHOUT CONTRAST TECHNIQUE: Contiguous axial images were obtained from the base of the skull through the vertex without intravenous contrast. COMPARISON:  None available FINDINGS: Brain: No evidence of acute infarction, hemorrhage, hydrocephalus, extra-axial collection or mass lesion/mass effect. Mild chronic microvascular ischemic change in the deep  cerebral white matter. Normal brain volume. Vascular: Atherosclerotic calcification. No hyperdense vessel (apparent density at the distal basilar on axial images is not confirmed on reformats) Skull: No acute or aggressive finding Sinuses/Orbits: Bilateral cataract resection. IMPRESSION: No acute finding. Electronically Signed   By: Monte Fantasia M.D.   On: 07/17/2016 14:54   Mr Brain Wo Contrast  Result Date: 07/17/2016 CLINICAL DATA:  Headaches. Balance issues. Right arm and leg weakness. Right hand weakness and numbness. Symptoms began yesterday. Initial encounter. EXAM: MRI HEAD WITHOUT CONTRAST TECHNIQUE: Multiplanar, multiecho pulse sequences of the brain and surrounding structures were obtained without intravenous contrast. COMPARISON:  CT head without contrast from the same day. FINDINGS: Brain: The diffusion-weighted images demonstrate acute/subacute nonhemorrhagic 11 mm infarct involving the posterior limb of the left internal capsule. No additional infarcts are present. The brainstem and cerebellum are unremarkable. T2 signal changes are associated with the infarct. Moderate periventricular and scattered subcortical T2 hyperintensities are also present. White matter changes extend into the brainstem. A remote lacunar infarct present in the right cerebellum. The internal auditory canals are within normal limits bilaterally. Vascular: Flow is present in the major intracranial arteries. Skull  and upper cervical spine: A well-circumscribed hypodense lesion in the superficial lobe of the right parotid gland measures 11 x 10 x 16 mm. The skullbase is otherwise within normal limits. The craniocervical junction is normal. Degenerative changes are noted in the upper cervical spine at C3-4 and C4-5. Marrow signal is within normal limits. Sinuses/Orbits: The paranasal sinuses and mastoid air cells are clear. Bilateral lens replacements are present. The globes and orbits are otherwise within normal limits.  IMPRESSION: 1. Acute/subacute nonhemorrhagic infarct of the posterior limb left internal capsule measures 11 mm. 2. Moderate age advanced white matter disease likely reflects the sequela of chronic microvascular ischemia. 3. Well-circumscribed 16 mm mass lesion within the superficial lobe of the the right parotid gland. This likely represents a benign lesion such as a benign mixed tumor, or less likely a Warthin's tumor. These results were called by telephone at the time of interpretation on 07/17/2016 at 7:19 pm to Dr. Harvest Dark , who verbally acknowledged these results. Electronically Signed   By: San Morelle M.D.   On: 07/17/2016 19:21     IMPRESSION AND PLAN:   * Acute/subacute nonhemorrhagic infarct of the posterior limb left internal capsule measures 11 mm -Check  Carotid dopplers, Echo - Start aspirin and statin. - Lovenox for DVT prophylaxis. - PT/OT/Speech consult as needed per symptoms - Neuro checks every 4 hours for 24 hours. - Consult neurology. - Telemetry - Stroke swallow screen completed in the emergency room - Check lipid panel in the morning  * Elevated blood pressure likely due to acute CVA. Patient does have mildly elevated blood pressure at baseline at systolic of 035W. We will add IV when necessary hydralazine.  * Hypothyroidism. Continue levothyroxine.   All the records are reviewed and case discussed with ED provider. Management plans discussed with the patient, family and they are in agreement.  CODE STATUS: FULL CODE  TOTAL TIME TAKING CARE OF THIS PATIENT: 40 minutes.   Hillary Bow R M.D on 07/17/2016 at 7:48 PM  Between 7am to 6pm - Pager - [phone]  After 6pm go to [email or web address] - password [email or web address] [email or web address]  Office  [phone]  CC: Primary care physician; Adin Hector, MD  Note: This dictation was prepared with Dragon dictation along with smaller phrase technology. Any transcriptional errors that  result from this process are unintentional.

## 2016-07-17 NOTE — ED Notes (Signed)
Pt ambulatory to bathroom with assistance.

## 2016-07-17 NOTE — ED Notes (Signed)
Pt ambulated to restroom. 

## 2016-07-17 NOTE — ED Notes (Signed)
Transporting patient to room 118-1C

## 2016-07-18 ENCOUNTER — Inpatient Hospital Stay (HOSPITAL_COMMUNITY)
Admit: 2016-07-18 | Discharge: 2016-07-18 | Disposition: A | Discharge status: Home / Self Care | Payer: PPO | Attending: Internal Medicine | Admitting: Internal Medicine

## 2016-07-18 ENCOUNTER — Encounter
Admission: RE | Admit: 2016-07-18 | Discharge: 2016-07-18 | Disposition: A | Discharge status: Home / Self Care | Payer: PPO | Source: Ambulatory Visit | Attending: Internal Medicine | Admitting: Internal Medicine

## 2016-07-18 ENCOUNTER — Inpatient Hospital Stay: Payer: PPO

## 2016-07-18 DIAGNOSIS — G459 Transient cerebral ischemic attack, unspecified: Secondary | ICD-10-CM

## 2016-07-18 DIAGNOSIS — I639 Cerebral infarction, unspecified: Secondary | ICD-10-CM

## 2016-07-18 LAB — LIPID PANEL
Cholesterol: 212 mg/dL — ABNORMAL HIGH (ref 0–200)
HDL: 49 mg/dL (ref >40)
LDL CALC: 141 mg/dL — AB (ref 0–99)
Total CHOL/HDL Ratio: 4.3 RATIO
Triglycerides: 110 mg/dL (ref <150)
VLDL: 22 mg/dL (ref 0–40)

## 2016-07-18 MED ORDER — LAMOTRIGINE 25 MG PO TABS
25.0000 mg | ORAL_TABLET | Freq: Every day | ORAL | Status: DC
  Administered 2016-07-18: 22:00:00 25 mg via ORAL
  Filled 2016-07-18: qty 1

## 2016-07-18 MED ORDER — CLOPIDOGREL BISULFATE 75 MG PO TABS
75.0000 mg | ORAL_TABLET | Freq: Every day | ORAL | Status: DC
  Administered 2016-07-18 – 2016-07-19 (×2): 75 mg via ORAL
  Filled 2016-07-18 (×3): qty 1

## 2016-07-18 NOTE — Consult Note (Signed)
Referring Physician: Verdell Carmine    Chief Complaint: Right sided weakness  HPI: [name] is an 71 y.o. female who reports that on Tuesday morning while writing she began to notice tingling in her fingertips. This progressed to the point that she was unable to hold the pen.  She later on the next day felt that she was not as steady on her feet.  Her husband noted that her right leg was shaky.  BP was checked and was elevated.  Patient was brought in for evaluation at that time.  Initial NIHSS of 1.    Date last known well: Date: 07/16/2016 Time last known well: Time: 10:00 tPA Given: No: Outside time window  Past Medical History:  Diagnosis Date  . Depression   . Hyperlipidemia   . Muscle pain   . Neuropathy   . RA (rheumatoid arthritis) ([place])   . Thyroid disease   . Vitamin B 12 deficiency     Past Surgical History:  Procedure Laterality Date  . ABDOMINAL HYSTERECTOMY    . BREAST BIOPSY Left    2 benign biopsies  . ECTOPIC PREGNANCY SURGERY    . KNEE SURGERY Right   . LAMINECTOMY    . leg vein stripping Bilateral   . TONSILLECTOMY      Family History  Problem Relation Age of Onset  . COPD Father   . Cancer Father   . Breast cancer Cousin   . Breast cancer Maternal Aunt   . Breast cancer Maternal Aunt    Social History:  reports that she has never smoked. She has never used smokeless tobacco. Her alcohol and drug histories are not on file.  Allergies:  Allergies  Allergen Reactions  . Codeine Nausea And Vomiting and Other (See Comments)  . Demerol [Meperidine] Nausea And Vomiting    Medications:  I have reviewed the patient's current medications. Prior to Admission:  Prescriptions Prior to Admission  Medication Sig Dispense Refill Last Dose  . Adalimumab (HUMIRA) 40 MG/0.8ML PSKT Inject 40 mg into the skin every 14 (fourteen) days.    07/16/2016 at Unknown time  . aspirin EC 81 MG tablet Take 81 mg by mouth daily.    07/17/2016 at am  . clonazePAM (KLONOPIN)  0.5 MG tablet Take 0.5 mg by mouth 2 (two) times daily as needed.    prn at prn  . folic acid (FOLVITE) 1 MG tablet Take 1 mg by mouth daily.   07/17/2016 at am  . lamoTRIgine (LAMICTAL) 25 MG tablet Take 25 mg by mouth daily.   07/17/2016 at am  . levothyroxine (SYNTHROID, LEVOTHROID) 112 MCG tablet Take 112 mcg by mouth daily.    07/17/2016 at am  . methotrexate (RHEUMATREX) 2.5 MG tablet Take 25 mg by mouth once a week.    07/15/2016  . PARoxetine (PAXIL) 20 MG tablet Take 20 mg by mouth 2 (two) times daily.   07/17/2016 at am  . VITAMIN D, ERGOCALCIFEROL, PO Take 1 tablet by mouth daily.    07/17/2016 at am   Scheduled: . aspirin EC  81 mg Oral Daily  . atorvastatin  40 mg Oral q1800  . enoxaparin (LOVENOX) injection  40 mg Subcutaneous Q24H  . lamoTRIgine  25 mg Oral Daily  . levothyroxine  112 mcg Oral QAC breakfast  . PARoxetine  20 mg Oral BID  . pneumococcal 23 valent vaccine  0.5 mL Intramuscular Tomorrow-1000  . sodium chloride flush  3 mL Intravenous Q12H  . sodium chloride  flush  3 mL Intravenous Q12H    ROS: History obtained from the patient  General ROS: negative for - chills, fatigue, fever, night sweats, weight gain or weight loss Psychological ROS: negative for - behavioral disorder, hallucinations, memory difficulties, mood swings or suicidal ideation Ophthalmic ROS: negative for - blurry vision, double vision, eye pain or loss of vision ENT ROS: negative for - epistaxis, nasal discharge, oral lesions, sore throat, tinnitus or vertigo Allergy and Immunology ROS: negative for - hives or itchy/watery eyes Hematological and Lymphatic ROS: negative for - bleeding problems, bruising or swollen lymph nodes Endocrine ROS: negative for - galactorrhea, hair pattern changes, polydipsia/polyuria or temperature intolerance Respiratory ROS: negative for - cough, hemoptysis, shortness of breath or wheezing Cardiovascular ROS: negative for - chest pain, dyspnea on exertion, edema or  irregular heartbeat Gastrointestinal ROS: negative for - abdominal pain, diarrhea, hematemesis, nausea/vomiting or stool incontinence Genito-Urinary ROS: negative for - dysuria, hematuria, incontinence or urinary frequency/urgency Musculoskeletal ROS: arthritic pain Neurological ROS: as noted in HPI Dermatological ROS: negative for rash and skin lesion changes  Physical Examination: Blood pressure (!) 176/68, pulse (!) 53, temperature 97.9 F (36.6 C), temperature source Oral, resp. rate (!) 24, height 5\' 5"  (1.651 m), weight 73.3 kg (161 lb 9.6 oz), SpO2 93 %.  HEENT-  Normocephalic, no lesions, without obvious abnormality.  Normal external eye and conjunctiva.  Normal TM's bilaterally.  Normal auditory canals and external ears. Normal external nose, mucus membranes and septum.  Normal pharynx. Cardiovascular- S1, S2 normal, pulses palpable throughout   Lungs- chest clear, no wheezing, rales, normal symmetric air entry Abdomen- soft, non-tender; bowel sounds normal; no masses,  no organomegaly Extremities- no edema Lymph-no adenopathy palpable Musculoskeletal-osteoarthritic changes noted in both hands Skin-warm and dry, no hyperpigmentation, vitiligo, or suspicious lesions  Neurological Examination   Mental Status: Alert, oriented, thought content appropriate.  Speech fluent without evidence of aphasia.  Able to follow 3 step commands without difficulty. Cranial Nerves: II: Discs flat bilaterally; Visual fields grossly normal, pupils equal, round, reactive to light and accommodation III,IV, VI: mild right ptosis, extra-ocular motions intact bilaterally V,VII: smile symmetric, facial light touch sensation normal bilaterally VIII: hearing normal bilaterally IX,X: gag reflex present XI: bilateral shoulder shrug XII: midline tongue extension Motor: Right : Upper extremity   4+/5  with drift   Left:     Upper extremity   5/5  Lower extremity   4+/5     Lower extremity   5/5 Tone and  bulk:normal tone throughout; no atrophy noted Sensory: Pinprick and light touch decreased in the RUE Deep Tendon Reflexes: 2+ and symmetric throughout Plantars: Right: downgoing   Left: downgoing Cerebellar: Normal finger-to-nose and normal heel-to-shin testing bilaterally Gait: not tested bilaterally    Laboratory Studies:  Basic Metabolic Panel:  Recent Labs Lab 07/17/16 1415  NA 141  K 3.4*  CL 108  CO2 24  GLUCOSE 136*  BUN 9  CREATININE 0.79  CALCIUM 9.1    Liver Function Tests:  Recent Labs Lab 07/17/16 1415  AST 20  ALT 10*  ALKPHOS 98  BILITOT 0.7  PROT 7.5  ALBUMIN 3.9   No results for input(s): LIPASE, AMYLASE in the last 168 hours. No results for input(s): AMMONIA in the last 168 hours.  CBC:  Recent Labs Lab 07/17/16 1415  WBC 6.8  NEUTROABS 4.6  HGB 15.2  HCT 45.8  MCV 95.2  PLT 294    Cardiac Enzymes:  Recent Labs Lab 07/17/16 1415  TROPONINI <0.03    BNP: Invalid input(s): POCBNP  CBG: No results for input(s): GLUCAP in the last 168 hours.  Microbiology: No results found for this or any previous visit.  Coagulation Studies:  Recent Labs  07/17/16 1415  LABPROT 12.8  INR 0.96    Urinalysis: No results for input(s): COLORURINE, LABSPEC, PHURINE, GLUCOSEU, HGBUR, BILIRUBINUR, KETONESUR, PROTEINUR, UROBILINOGEN, NITRITE, LEUKOCYTESUR in the last 168 hours.  Invalid input(s): APPERANCEUR  Lipid Panel:    Component Value Date/Time   CHOL 212 (H) 07/18/2016 0544   TRIG 110 07/18/2016 0544   HDL 49 07/18/2016 0544   CHOLHDL 4.3 07/18/2016 0544   VLDL 22 07/18/2016 0544   LDLCALC 141 (H) 07/18/2016 0544    HgbA1C: No results found for: HGBA1C  Urine Drug Screen:  No results found for: LABOPIA, COCAINSCRNUR, LABBENZ, AMPHETMU, THCU, LABBARB  Alcohol Level: No results for input(s): ETH in the last 168 hours.  Other results: EKG: normal sinus rhythm at 60 bpm.  Imaging: Ct Head Wo Contrast  Result Date:  07/17/2016 CLINICAL DATA:  Right hand numbness and dizziness. EXAM: CT HEAD WITHOUT CONTRAST TECHNIQUE: Contiguous axial images were obtained from the base of the skull through the vertex without intravenous contrast. COMPARISON:  None available FINDINGS: Brain: No evidence of acute infarction, hemorrhage, hydrocephalus, extra-axial collection or mass lesion/mass effect. Mild chronic microvascular ischemic change in the deep cerebral white matter. Normal brain volume. Vascular: Atherosclerotic calcification. No hyperdense vessel (apparent density at the distal basilar on axial images is not confirmed on reformats) Skull: No acute or aggressive finding Sinuses/Orbits: Bilateral cataract resection. IMPRESSION: No acute finding. Electronically Signed   By: Monte Fantasia M.D.   On: 07/17/2016 14:54   Mr Brain Wo Contrast  Result Date: 07/17/2016 CLINICAL DATA:  Headaches. Balance issues. Right arm and leg weakness. Right hand weakness and numbness. Symptoms began yesterday. Initial encounter. EXAM: MRI HEAD WITHOUT CONTRAST TECHNIQUE: Multiplanar, multiecho pulse sequences of the brain and surrounding structures were obtained without intravenous contrast. COMPARISON:  CT head without contrast from the same day. FINDINGS: Brain: The diffusion-weighted images demonstrate acute/subacute nonhemorrhagic 11 mm infarct involving the posterior limb of the left internal capsule. No additional infarcts are present. The brainstem and cerebellum are unremarkable. T2 signal changes are associated with the infarct. Moderate periventricular and scattered subcortical T2 hyperintensities are also present. White matter changes extend into the brainstem. A remote lacunar infarct present in the right cerebellum. The internal auditory canals are within normal limits bilaterally. Vascular: Flow is present in the major intracranial arteries. Skull and upper cervical spine: A well-circumscribed hypodense lesion in the superficial lobe of  the right parotid gland measures 11 x 10 x 16 mm. The skullbase is otherwise within normal limits. The craniocervical junction is normal. Degenerative changes are noted in the upper cervical spine at C3-4 and C4-5. Marrow signal is within normal limits. Sinuses/Orbits: The paranasal sinuses and mastoid air cells are clear. Bilateral lens replacements are present. The globes and orbits are otherwise within normal limits. IMPRESSION: 1. Acute/subacute nonhemorrhagic infarct of the posterior limb left internal capsule measures 11 mm. 2. Moderate age advanced white matter disease likely reflects the sequela of chronic microvascular ischemia. 3. Well-circumscribed 16 mm mass lesion within the superficial lobe of the the right parotid gland. This likely represents a benign lesion such as a benign mixed tumor, or less likely a Warthin's tumor. These results were called by telephone at the time of interpretation on 07/17/2016 at 7:19 pm to Dr.  KEVIN PADUCHOWSKI , who verbally acknowledged these results. Electronically Signed   By: San Morelle M.D.   On: 07/17/2016 19:21   US Carotid Bilateral  Result Date: 07/18/2016 CLINICAL DATA:  CVA. Carotid bruit. Syncopal episode. History of hypertension. EXAM: BILATERAL CAROTID DUPLEX ULTRASOUND TECHNIQUE: Pearline Cables scale imaging, color Doppler and duplex ultrasound were performed of bilateral carotid and vertebral arteries in the neck. COMPARISON:  None. FINDINGS: Criteria: Quantification of carotid stenosis is based on velocity parameters that correlate the residual internal carotid diameter with NASCET-based stenosis levels, using the diameter of the distal internal carotid lumen as the denominator for stenosis measurement. The following velocity measurements were obtained: RIGHT ICA:  79/22 cm/sec CCA:  92/92 cm/sec SYSTOLIC ICA/CCA RATIO:  1.0 DIASTOLIC ICA/CCA RATIO:  1.4 ECA:  97 cm/sec LEFT ICA:  66/16 cm/sec CCA:  44/62 cm/sec SYSTOLIC ICA/CCA RATIO:  0.8 DIASTOLIC  ICA/CCA RATIO:  1.1 ECA:  96 cm/sec RIGHT CAROTID ARTERY: There is a moderate amount of eccentric mixed echogenic partially shadowing plaque within the right carotid bulb (images 14 and 15). There is a minimal amount of eccentric mixed echogenic plaque involving the origin of the right internal carotid artery over image 22), not resulting in elevated peak systolic velocities within the interrogated course the right internal carotid artery to suggest a hemodynamically significant stenosis. RIGHT VERTEBRAL ARTERY:  Antegrade Flow LEFT CAROTID ARTERY: There is a minimal to moderate amount of eccentric mixed echogenic plaque within the left carotid bulb (images 45 and 46), extending to involve the origin and proximal aspects of the left internal carotid artery (image 53), not resulting in elevated peak systolic velocities within the interrogated course of the left internal carotid artery to suggest a hemodynamically significant stenosis. LEFT VERTEBRAL ARTERY:  Antegrade Flow IMPRESSION: Minimal to moderate amount of bilateral atherosclerotic plaque, not resulting in a hemodynamically significant stenosis within either internal carotid artery. Electronically Signed   By: Sandi Mariscal M.D.   On: 07/18/2016 10:01    Assessment: 71 y.o. female presenting with right sided weakness, outside time window for tPA.  MRI reviewed and shows a left acute infarct in the posterior limb of the internal capsule.  Likely secondary to small vessel disease.  LDL 141.  Carotid dopplers show no evidence of hemodynamically significant stenosis.  Echocardiogram pending.  Patient on ASA at home.    Stroke Risk Factors - hyperlipidemia  Plan: 1. HgbA1c 2. Statin for lipid management with target LDL<70. 3. PT consult, OT consult, Speech consult 4. Echocardiogram 5. Prophylactic therapy-Antiplatelet med: Plavix - dose 75mg  daily and ASA 81mg  daily.  Will likely be decreased to Plavix only on an outpatient basis.   7. NPO until RN  stroke swallow screen 8. Telemetry monitoring 9. Frequent neuro checks   Alexis Goodell, MD Neurology [phone] 07/18/2016, 10:15 AM

## 2016-07-18 NOTE — Progress Notes (Signed)
PT Cancellation Note  Patient Details Name: [name] MRN: [id number] DOB: 09/19/1945   Cancelled Treatment:    Reason Eval/Treat Not Completed: Patient at procedure or test/unavailable (Consult received and chart reviewed.  Patient currently off unit for diagnostic testing.  Will re-attempt at later time/date as patient available and medically appropriate.)   Christia Coaxum H. Owens Shark, PT, DPT, NCS 07/18/16, 9:40 AM [phone]

## 2016-07-18 NOTE — NC FL2 (Signed)
[place] LEVEL OF CARE SCREENING TOOL     IDENTIFICATION  Patient Name: [name] Birthdate: Sep 01, 1945 Sex: female Admission Date (Current Location): 07/17/2016  [place] and [place] Number:  Engineering geologist and Address:  [facility], [address] [place], [place] [address]      Provider Number: [id number]  Attending Physician Name and Address:  Henreitta Leber, MD  Relative Name and Phone Number:       Current Level of Care: Hospital Recommended Level of Care: [place] Prior Approval Number:    Date Approved/Denied:   PASRR Number:  [id number])  Discharge Plan: SNF    Current Diagnoses: Patient Active Problem List   Diagnosis Date Noted  . Acute CVA (cerebrovascular accident) ([place]) 07/17/2016  . Rheumatoid arthritis with rheumatoid factor ([place]) 09/20/2013    Orientation RESPIRATION BLADDER Height & Weight     Self, Time, Situation, Place  Normal Continent Weight: 161 lb 9.6 oz (73.3 kg) Height:  5\' 5"  (165.1 cm)  BEHAVIORAL SYMPTOMS/MOOD NEUROLOGICAL BOWEL NUTRITION STATUS   (none)  (none) Continent Diet (Diet: Heart Healthy )  AMBULATORY STATUS COMMUNICATION OF NEEDS Skin   Extensive Assist Verbally Normal                       Personal Care Assistance Level of Assistance  Bathing, Feeding, Dressing Bathing Assistance: Limited assistance Feeding assistance: Independent Dressing Assistance: Limited assistance     Functional Limitations Info  Sight, Hearing, Speech Sight Info: Adequate Hearing Info: Adequate Speech Info: Adequate    SPECIAL CARE FACTORS FREQUENCY  PT (By licensed PT), OT (By licensed OT), Speech therapy     PT Frequency:  (5) OT Frequency:  (5)     Speech Therapy Frequency:  (5)      Contractures      Additional Factors Info  Code Status, Allergies Code Status Info:  (Full Code. ) Allergies Info:  (Codeine, Demerol Meperidine)            Current Medications (07/18/2016):  This is the current hospital active medication list Current Facility-Administered Medications  Medication Dose Route Frequency Provider Last Rate Last Dose  . 0.9 %  sodium chloride infusion  250 mL Intravenous PRN Srikar Sudini, MD      . acetaminophen (TYLENOL) tablet 650 mg  650 mg Oral Q6H PRN Hillary Bow, MD   650 mg at 07/18/16 0206   Or  . acetaminophen (TYLENOL) suppository 650 mg  650 mg Rectal Q6H PRN Hillary Bow, MD      . aspirin EC tablet 81 mg  81 mg Oral Daily Hillary Bow, MD   81 mg at 07/18/16 1256  . atorvastatin (LIPITOR) tablet 40 mg  40 mg Oral q1800 Srikar Sudini, MD      . clonazePAM (KLONOPIN) tablet 0.5 mg  0.5 mg Oral BID PRN Srikar Sudini, MD      . clopidogrel (PLAVIX) tablet 75 mg  75 mg Oral Daily Alexis Goodell, MD   75 mg at 07/18/16 1256  . enoxaparin (LOVENOX) injection 40 mg  40 mg Subcutaneous Q24H Hillary Bow, MD   40 mg at 07/17/16 2256  . hydrALAZINE (APRESOLINE) injection 10 mg  10 mg Intravenous Q6H PRN Hillary Bow, MD   10 mg at 07/17/16 2024  . lamoTRIgine (LAMICTAL) tablet 25 mg  25 mg Oral QHS Henreitta Leber, MD      . levothyroxine (SYNTHROID, LEVOTHROID) tablet 112 mcg  112 mcg Oral QAC breakfast Hillary Bow, MD   112 mcg at 07/18/16 0755  . ondansetron (ZOFRAN) tablet 4 mg  4 mg Oral Q6H PRN Hillary Bow, MD       Or  . ondansetron (ZOFRAN) injection 4 mg  4 mg Intravenous Q6H PRN Hillary Bow, MD   4 mg at 07/18/16 1021  . PARoxetine (PAXIL) tablet 20 mg  20 mg Oral BID Srikar Sudini, MD      . polyethylene glycol (MIRALAX / GLYCOLAX) packet 17 g  17 g Oral Daily PRN Srikar Sudini, MD      . sodium chloride flush (NS) 0.9 % injection 3 mL  3 mL Intravenous Q12H Srikar Sudini, MD      . sodium chloride flush (NS) 0.9 % injection 3 mL  3 mL Intravenous Q12H Srikar Sudini, MD   3 mL at 07/18/16 1257  . sodium chloride flush (NS) 0.9 % injection 3 mL  3 mL Intravenous PRN Hillary Bow, MD          Discharge Medications: Please see discharge summary for a list of discharge medications.  Relevant Imaging Results:  Relevant Lab Results:   Additional Information  (SSN: [id number])  Kadarious Dikes, Veronia Beets, LCSW

## 2016-07-18 NOTE — Evaluation (Signed)
Occupational Therapy Evaluation Patient Details Name: [name] MRN: [id number] DOB: February 10, 1946 Today's Date: 07/18/2016    History of Present Illness 71 y.o. female with a past medical history of depression, neuropathy, HLD, hypothyroidism, rheumatoid arthritis, who presents to the ED 4/18 with right hand numbness since 4/17. According to the patient since around 5 PM 4/17 she has a feeling of numbness in the right hand and feels off balance. Spouse reports slurred speech evening of 4/17 but largely recovered. No history of CVA in the past. CT negative, MRI indicates left acute infarct in the posterior limb of the internal capsule.  Per neurology, likely secondary to small vessel disease. Carotid dopplers show no evidence of hemodynamically significant stenosis.  Echocardiogram pending.   Clinical Impression   Pt seen for OT evaluation this date. Pt was independent at baseline and is eager to return to PLOF. Pt reported mild dizziness with transitional movements but resolved quickly, reports mild nausea throughout session, no vomiting. Pt able to communicate with no deficits noted, appropriate responses to safety questions, problem solving, and immediate and delayed recall. Pt presents with deficits in fine motor coordination, strength, sensory impairment in RUE and to lesser extent RLE, decreased activity tolerance, impaired balance, and increased falls risk, and need for skilled OT services to address noted impairments and functional deficits to maximize functional return to PLOF and minimize falls risk. Pt eager to return home, however given deficits at time of evaluation, recommending STR at this time. Will continue to monitor and reassess for appropriateness of home health services.    Follow Up Recommendations  SNF    Equipment Recommendations       Recommendations for Other Services       Precautions / Restrictions Precautions Precautions: Fall Restrictions Weight Bearing  Restrictions: No      Mobility Bed Mobility Overal bed mobility: Needs Assistance Bed Mobility: Supine to Sit;Sit to Supine     Supine to sit: HOB elevated;Min guard Sit to supine: Min guard   General bed mobility comments: additional time/effort to perform, no physical assist required  Transfers Overall transfer level: Needs assistance Equipment used: 1 person hand held assist;Rolling walker (2 wheeled) Transfers: Sit to/from Stand Sit to Stand: Min assist;Min guard         General transfer comment: initial sit to stand from EOB with handheld assist requiring min assist, pt reporting slightly dizziness which subsided quickly, additional transfers completed with RW requiring min guard    Balance Overall balance assessment: Needs assistance Sitting-balance support: Bilateral upper extremity supported;Feet supported Sitting balance-Leahy Scale: Fair     Standing balance support: Bilateral upper extremity supported Standing balance-Leahy Scale: Fair                             ADL either performed or assessed with clinical judgement   ADL Overall ADL's : Needs assistance/impaired     Grooming: Wash/dry hands;Standing;Min guard   Upper Body Bathing: Minimal assistance;Bed level   Lower Body Bathing: Minimal assistance;Moderate assistance;Sitting/lateral leans   Upper Body Dressing : Set up;Supervision/safety;Sitting   Lower Body Dressing: Minimal assistance;Min guard;Sit to/from stand;Cueing for safety Lower Body Dressing Details (indicate cue type and reason): cueing for hand placement on RW prior to completing dressing of undergarments over hips Toilet Transfer: Min guard;Regular Toilet;Grab bars;Cueing for safety Toilet Transfer Details (indicate cue type and reason): cues for hand placement to maximize safety Toileting- Clothing Manipulation and Hygiene: Minimal assistance;Sit to/from  stand;Sitting/lateral lean Toileting - Clothing Manipulation Details  (indicate cue type and reason): min assist to reapply incontinence product in briefs; pt able to perform hygiene      Functional mobility during ADLs: Min guard;Rolling walker General ADL Comments: pt generally min assist for LB ADL, cueing for safety during functional mobility and transitional movements     Vision   Ocular Range of Motion: Within Functional Limits Alignment/Gaze Preference: Within Defined Limits Tracking/Visual Pursuits: Able to track stimulus in all quads without difficulty Convergence: Within functional limits Diplopia Assessment:  (no apparent deficits)     Perception     Praxis      Pertinent Vitals/Pain Pain Assessment: No/denies pain     Hand Dominance Right   Extremity/Trunk Assessment Upper Extremity Assessment Upper Extremity Assessment: RUE deficits/detail;LUE deficits/detail RUE Deficits / Details: full ROM, shoulder flexion 4/5, elbow flexion/extension 4-/5, wrist ext/flex 3+/5, grip strength fair RUE Sensation: decreased light touch RUE Coordination: decreased fine motor LUE Deficits / Details: grossly 4+/5, ROM WFL   Lower Extremity Assessment Lower Extremity Assessment: Defer to PT evaluation;Generalized weakness (RLE weaker than LUE, decreased gross motor)   Cervical / Trunk Assessment Cervical / Trunk Assessment: Normal   Communication Communication Communication: No difficulties   Cognition Arousal/Alertness: Awake/alert Behavior During Therapy: WFL for tasks assessed/performed Overall Cognitive Status: Within Functional Limits for tasks assessed                                 General Comments: Pt able to respond appropriately to safety questions, 3/3 immediate and delayed recall, good problem solving    General Comments       Exercises Other Exercises Other Exercises: pt encouraged to utilize R (dominant) hand during functional tasks as normal to support neuro re-ed   Shoulder Instructions      Home Living  Family/patient expects to be discharged to:: Private residence Living Arrangements: Spouse/significant other Available Help at Discharge: Family;Available 24 hours/day Type of Home: House Home Access: Stairs to enter [employer] of Steps: 2 Entrance Stairs-Rails: None Home Layout: Two level;1/2 bath on main level;Bed/bath upstairs     Bathroom Shower/Tub: Walk-in shower;Door   [employer] Toilet: Handicapped height Bathroom Accessibility: Yes How Accessible: Accessible via walker Home Equipment: None          Prior Functioning/Environment Level of Independence: Independent        Comments: Pt indep with ADL, IADL, including driving, no falls in past year reported, has been sleeping in recliner on 1st floor (no reason given) but no difficulty with stairs prior to admission        OT Problem List: Decreased strength;Decreased coordination;Decreased cognition;Impaired sensation;Decreased activity tolerance;Impaired balance (sitting and/or standing);Impaired UE functional use      OT Treatment/Interventions: Self-care/ADL training;Therapeutic exercise;Therapeutic activities;Neuromuscular education;Energy conservation;Patient/family education;DME and/or AE instruction;Balance training    OT Goals(Current goals can be found in the care plan section) Acute Rehab OT Goals Patient Stated Goal: get better OT Goal Formulation: With patient/family Time For Goal Achievement: 08/01/16 Potential to Achieve Goals: Good  OT Frequency: Min 2X/week   Barriers to D/C:            Co-evaluation              End of Session Equipment Utilized During Treatment: Gait belt;Rolling walker  Activity Tolerance: Patient limited by fatigue Patient left: in bed;with call bell/phone within reach;with bed alarm set;with family/visitor present;with SCD's reapplied  OT Visit Diagnosis: Other abnormalities of gait and mobility (R26.89);Other symptoms and signs involving cognitive  function;Unsteadiness on feet (R26.81);Muscle weakness (generalized) (M62.81)                Time: 3202-3343 OT Time Calculation (min): 36 min Charges:  OT General Charges $OT Visit: 1 Procedure OT Evaluation $OT Eval Moderate Complexity: 1 Procedure OT Treatments $Self Care/Home Management : 23-37 mins G-Codes:     Jeni Salles, MPH, MS, OTR/L ascom [phone] 07/18/16, 1:38 PM

## 2016-07-18 NOTE — Clinical Social Work Note (Signed)
Clinical Social Work Assessment  Patient Details  Name: [name] MRN: [id number] Date of Birth: Mar 20, 1946  Date of referral:  07/18/16               Reason for consult:  Facility Placement, Discharge Planning                Permission sought to share information with:  Chartered certified accountant granted to share information::  Yes, Verbal Permission Granted  Name::      [name]::   [name]   Relationship::     Contact Information:     Housing/Transportation Living arrangements for the past 2 months:  [name] of Information:  Patient Patient Interpreter Needed:  None Criminal Activity/Legal Involvement Pertinent to Current Situation/Hospitalization:  No - Comment as needed Significant Relationships:  Adult Children, Spouse Lives with:  Spouse Do you feel safe going back to the place where you live?  Yes Need for family participation in patient care:  Yes (Comment)  Care giving concerns: Patient lives with her husband, [name], at home in [name].   Facilities manager / plan:  Social work Theatre manager received consult from OT that patient needs SNF. PT is pending. Social work Theatre manager met with patient alone at bedside. Patient was laying in bed watching TV and was alert and oriented x4. Per patient, she lives at home in [name] with [name]. Patient's husband is patient's HPOA. Patient has one son [name] that lives in the area. Social work Theatre manager explained to patient that OT is recommending SNF. Patient is agreeable to SNF search in [facility]. Patient and patient's husband have a preference of [name]. Patient has [employer]. [employer] case manager is aware of above. Social work Theatre manager will continue to assist and follow as needed.   FL2 completed and faxed out.   Employment status:  Unemployed Nurse, adult PT Recommendations:  [place] / Referral to community resources:  [name]  Patient/Family's Response to care:  Patient is agreeable to [name] in [place].   Patient/Family's Understanding of and Emotional Response to Diagnosis, Current Treatment, and Prognosis:  Patient was pleasant and thanked social work Theatre manager for visiting.   Emotional Assessment Appearance:  Appears stated age Attitude/Demeanor/Rapport:    Affect (typically observed):  Accepting, Adaptable, Appropriate Orientation:  Oriented to Self, Oriented to Place, Oriented to  Time, Oriented to Situation Alcohol / Substance use:  Not Applicable Psych involvement (Current and /or in the community):  No (Comment)  Discharge Needs  Concerns to be addressed:  Basic Needs Readmission within the last 30 days:  No Current discharge risk:  Dependent with Mobility, Chronically ill Barriers to Discharge:  Continued Medical Work up   [name], [name] Work 07/18/2016, 3:11 PM

## 2016-07-18 NOTE — Progress Notes (Signed)
*  PRELIMINARY RESULTS* Echocardiogram 2D Echocardiogram has been performed.  Sherrie Sport 07/18/2016, 8:52 AM

## 2016-07-18 NOTE — Progress Notes (Signed)
[facility] [place] NAME: [name]    MR#:  [id number]  DATE OF BIRTH:  12/02/1945  SUBJECTIVE:   Patient here due to right sided weakness and numbness, MRI is positive for a left sided acute CVA. Still having significant right-sided weakness and numbness. Seen by neurology. No headache, family at bedside.  REVIEW OF SYSTEMS:    Review of Systems  Constitutional: Negative for chills and fever.  HENT: Negative for congestion and tinnitus.   Eyes: Negative for blurred vision and double vision.  Respiratory: Negative for cough, shortness of breath and wheezing.   Cardiovascular: Negative for chest pain, orthopnea and PND.  Gastrointestinal: Negative for abdominal pain, diarrhea, nausea and vomiting.  Genitourinary: Negative for dysuria and hematuria.  Neurological: Positive for sensory change and focal weakness. Negative for dizziness.  All other systems reviewed and are negative.   Nutrition: Heart Healthy Tolerating Diet: Yes Tolerating PT: Eval noted.    DRUG ALLERGIES:   Allergies  Allergen Reactions  . Codeine Nausea And Vomiting and Other (See Comments)  . Demerol [Meperidine] Nausea And Vomiting    VITALS:  Blood pressure (!) 153/52, pulse (!) 55, temperature 97.9 F (36.6 C), temperature source Oral, resp. rate 16, height 5\' 5"  (1.651 m), weight 73.3 kg (161 lb 9.6 oz), SpO2 97 %.  PHYSICAL EXAMINATION:   Physical Exam  GENERAL:  71 y.o.-year-old patient lying in bed in no acute distress.  EYES: Pupils equal, round, reactive to light and accommodation. No scleral icterus. Extraocular muscles intact.  HEENT: Head atraumatic, normocephalic. Oropharynx and nasopharynx clear.  NECK:  Supple, no jugular venous distention. No thyroid enlargement, no tenderness.  LUNGS: Normal breath sounds bilaterally, no wheezing, rales, rhonchi. No use of accessory muscles of respiration.  CARDIOVASCULAR: S1, S2 normal. No murmurs,  rubs, or gallops.  ABDOMEN: Soft, nontender, nondistended. Bowel sounds present. No organomegaly or mass.  EXTREMITIES: No cyanosis, clubbing or edema b/l.    NEUROLOGIC: Cranial nerves II through XII are intact. Right upper and lower extremity weakness. 3 out of 5 strength in right upper extremity, 4 out of 5 in the right lower extremity.   PSYCHIATRIC: The patient is alert and oriented x 3.  SKIN: No obvious rash, lesion, or ulcer.    LABORATORY PANEL:   CBC  Recent Labs Lab 07/17/16 1415  WBC 6.8  HGB 15.2  HCT 45.8  PLT 294   ------------------------------------------------------------------------------------------------------------------  Chemistries   Recent Labs Lab 07/17/16 1415  NA 141  K 3.4*  CL 108  CO2 24  GLUCOSE 136*  BUN 9  CREATININE 0.79  CALCIUM 9.1  AST 20  ALT 10*  ALKPHOS 98  BILITOT 0.7   ------------------------------------------------------------------------------------------------------------------  Cardiac Enzymes  Recent Labs Lab 07/17/16 1415  TROPONINI <0.03   ------------------------------------------------------------------------------------------------------------------  RADIOLOGY:  Ct Head Wo Contrast  Result Date: 07/17/2016 CLINICAL DATA:  Right hand numbness and dizziness. EXAM: CT HEAD WITHOUT CONTRAST TECHNIQUE: Contiguous axial images were obtained from the base of the skull through the vertex without intravenous contrast. COMPARISON:  None available FINDINGS: Brain: No evidence of acute infarction, hemorrhage, hydrocephalus, extra-axial collection or mass lesion/mass effect. Mild chronic microvascular ischemic change in the deep cerebral white matter. Normal brain volume. Vascular: Atherosclerotic calcification. No hyperdense vessel (apparent density at the distal basilar on axial images is not confirmed on reformats) Skull: No acute or aggressive finding Sinuses/Orbits: Bilateral cataract resection. IMPRESSION: No acute  finding. Electronically Signed   By: Angelica Chessman  Watts M.D.   On: 07/17/2016 14:54   Mr Brain Wo Contrast  Result Date: 07/17/2016 CLINICAL DATA:  Headaches. Balance issues. Right arm and leg weakness. Right hand weakness and numbness. Symptoms began yesterday. Initial encounter. EXAM: MRI HEAD WITHOUT CONTRAST TECHNIQUE: Multiplanar, multiecho pulse sequences of the brain and surrounding structures were obtained without intravenous contrast. COMPARISON:  CT head without contrast from the same day. FINDINGS: Brain: The diffusion-weighted images demonstrate acute/subacute nonhemorrhagic 11 mm infarct involving the posterior limb of the left internal capsule. No additional infarcts are present. The brainstem and cerebellum are unremarkable. T2 signal changes are associated with the infarct. Moderate periventricular and scattered subcortical T2 hyperintensities are also present. White matter changes extend into the brainstem. A remote lacunar infarct present in the right cerebellum. The internal auditory canals are within normal limits bilaterally. Vascular: Flow is present in the major intracranial arteries. Skull and upper cervical spine: A well-circumscribed hypodense lesion in the superficial lobe of the right parotid gland measures 11 x 10 x 16 mm. The skullbase is otherwise within normal limits. The craniocervical junction is normal. Degenerative changes are noted in the upper cervical spine at C3-4 and C4-5. Marrow signal is within normal limits. Sinuses/Orbits: The paranasal sinuses and mastoid air cells are clear. Bilateral lens replacements are present. The globes and orbits are otherwise within normal limits. IMPRESSION: 1. Acute/subacute nonhemorrhagic infarct of the posterior limb left internal capsule measures 11 mm. 2. Moderate age advanced white matter disease likely reflects the sequela of chronic microvascular ischemia. 3. Well-circumscribed 16 mm mass lesion within the superficial lobe of the the  right parotid gland. This likely represents a benign lesion such as a benign mixed tumor, or less likely a Warthin's tumor. These results were called by telephone at the time of interpretation on 07/17/2016 at 7:19 pm to Dr. Harvest Dark , who verbally acknowledged these results. Electronically Signed   By: San Morelle M.D.   On: 07/17/2016 19:21   US Carotid Bilateral  Result Date: 07/18/2016 CLINICAL DATA:  CVA. Carotid bruit. Syncopal episode. History of hypertension. EXAM: BILATERAL CAROTID DUPLEX ULTRASOUND TECHNIQUE: Pearline Cables scale imaging, color Doppler and duplex ultrasound were performed of bilateral carotid and vertebral arteries in the neck. COMPARISON:  None. FINDINGS: Criteria: Quantification of carotid stenosis is based on velocity parameters that correlate the residual internal carotid diameter with NASCET-based stenosis levels, using the diameter of the distal internal carotid lumen as the denominator for stenosis measurement. The following velocity measurements were obtained: RIGHT ICA:  79/22 cm/sec CCA:  75/10 cm/sec SYSTOLIC ICA/CCA RATIO:  1.0 DIASTOLIC ICA/CCA RATIO:  1.4 ECA:  97 cm/sec LEFT ICA:  66/16 cm/sec CCA:  25/85 cm/sec SYSTOLIC ICA/CCA RATIO:  0.8 DIASTOLIC ICA/CCA RATIO:  1.1 ECA:  96 cm/sec RIGHT CAROTID ARTERY: There is a moderate amount of eccentric mixed echogenic partially shadowing plaque within the right carotid bulb (images 14 and 15). There is a minimal amount of eccentric mixed echogenic plaque involving the origin of the right internal carotid artery over image 22), not resulting in elevated peak systolic velocities within the interrogated course the right internal carotid artery to suggest a hemodynamically significant stenosis. RIGHT VERTEBRAL ARTERY:  Antegrade Flow LEFT CAROTID ARTERY: There is a minimal to moderate amount of eccentric mixed echogenic plaque within the left carotid bulb (images 45 and 46), extending to involve the origin and proximal  aspects of the left internal carotid artery (image 53), not resulting in elevated peak systolic velocities within the interrogated course  of the left internal carotid artery to suggest a hemodynamically significant stenosis. LEFT VERTEBRAL ARTERY:  Antegrade Flow IMPRESSION: Minimal to moderate amount of bilateral atherosclerotic plaque, not resulting in a hemodynamically significant stenosis within either internal carotid artery. Electronically Signed   By: Sandi Mariscal M.D.   On: 07/18/2016 10:01     ASSESSMENT AND PLAN:   71 year old female with past medical history of rheumatoid arthritis, hypertension, hyperlipidemia, neuropathy, hypothyroidism who presented to the hospital due to right upper extremity weakness and numbness.  1. Acute CVA-this is the cause of patient's right sided weakness and numbness. -MRI confirmed a left-sided internal capsule CVA. Continue aspirin, Plavix, atorvastatin. -Appreciate neurology consult. Carotid duplex negative for hemodynamically significant carotid stenosis, showing normal ejection fraction with no evidence of thrombus. - OT recommending SNF.  Await PT eval.   2. Hypothyroidism - cont. Synthroid.   3. Anxiety - cont. Klonopin, Paxil.  4. Hx of RA - cont. Methotrexate.  NO acute issue.   All the records are reviewed and case discussed with Care Management/Social Worker. Management plans discussed with the patient, family and they are in agreement.  CODE STATUS: Full code  DVT Prophylaxis: Lovenox  TOTAL TIME TAKING CARE OF THIS PATIENT: 30 minutes.   POSSIBLE D/C IN 1-2 DAYS, DEPENDING ON CLINICAL CONDITION.   Henreitta Leber M.D on 07/18/2016 at 2:50 PM  Between 7am to 6pm - Pager - [phone]  After 6pm go to [email or web address] - Proofreader  [employer] [facility]  Office  [phone]  CC: Primary care physician; Adin Hector, MD

## 2016-07-18 NOTE — Clinical Social Work Placement (Signed)
   CLINICAL SOCIAL WORK PLACEMENT  NOTE  Date:  07/18/2016  Patient Details  Name: [name] MRN: [id number] Date of Birth: 1945/10/07  Clinical Social Work is seeking post-discharge placement for this patient at the [place] level of care (*CSW will initial, date and re-position this form in  chart as items are completed):  Yes   Patient/family provided with [place] Work Department's list of facilities offering this level of care within the geographic area requested by the patient (or if unable, by the patient's family).  Yes   Patient/family informed of their freedom to choose among providers that offer the needed level of care, that participate in Medicare, Medicaid or managed care program needed by the patient, have an available bed and are willing to accept the patient.  Yes   Patient/family informed of [facility]'s ownership interest in [facility] and [facility], as well as of the fact that they are under no obligation to receive care at these facilities.  PASRR submitted to EDS on 07/18/16     PASRR number received on 07/18/16     Existing PASRR number confirmed on       FL2 transmitted to all facilities in geographic area requested by pt/family on 07/18/16     FL2 transmitted to all facilities within larger geographic area on       Patient informed that his/her managed care company has contracts with or will negotiate with certain facilities, including the following:            Patient/family informed of bed offers received.  Patient chooses bed at       Physician recommends and patient chooses bed at      Patient to be transferred to   on  .  Patient to be transferred to facility by       Patient family notified on   of transfer.  Name of family member notified:        PHYSICIAN       Additional Comment:    _______________________________________________ Cassian Torelli, Veronia Beets, LCSW 07/18/2016, 3:11 PM

## 2016-07-19 LAB — HEMOGLOBIN A1C
HEMOGLOBIN A1C: 5.5 % (ref 4.8–5.6)
Mean Plasma Glucose: 111 mg/dL

## 2016-07-19 MED ORDER — CLOPIDOGREL BISULFATE 75 MG PO TABS: 75.0000 mg | ORAL_TABLET | Freq: Every day | ORAL | 1 refills | Status: AC

## 2016-07-19 MED ORDER — ATORVASTATIN CALCIUM 40 MG PO TABS: 40.0000 mg | ORAL_TABLET | Freq: Every day | ORAL | 1 refills | Status: DC

## 2016-07-19 NOTE — Evaluation (Addendum)
Physical Therapy Evaluation Patient Details Name: [name] MRN: [id number] DOB: 10-28-1945 Today's Date: 07/19/2016   History of Present Illness  71 y.o. female with a past medical history of depression, neuropathy, HLD, hypothyroidism, rheumatoid arthritis, who presents to the ED 4/18 with right hand numbness since 4/17. According to the patient since around 5 PM 4/17 she has a feeling of numbness in the right hand and feels off balance. Spouse reports slurred speech evening of 4/17 but largely recovered. No history of CVA in the past. CT negative, MRI indicates left acute infarct in the posterior limb of the internal capsule.  Per neurology, likely secondary to small vessel disease. Carotid dopplers show no evidence of hemodynamically significant stenosis.  Echocardiogram pending.  Clinical Impression  Pt is a pleasant 71 year old female who was admitted for L CVA with R side weakness. Pt performs bed mobility with independence, transfers with cga, and ambulation with cga and RW. Further ambulation performed with no AD with cga and cues for fluid gait pattern. Pt demonstrates deficits with strength/mobility/balance. Decreased coordination noted in R hand with finger ->nose testing. Negative sensation testing. Pt reports significant improvement from yesterday. BERG score of 39, indicating balance deficits with LOB noted with dynamic movement during testing. Would benefit from skilled PT to address above deficits and promote optimal return to PLOF. Recommend transition to [place] upon discharge from acute hospitalization.       Follow Up Recommendations Home health PT;Supervision for mobility/OOB    Equipment Recommendations  Rolling walker with 5" wheels    Recommendations for Other Services       Precautions / Restrictions Precautions Precautions: Fall Restrictions Weight Bearing Restrictions: No      Mobility  Bed Mobility Overal bed mobility: Independent Bed Mobility: Supine  to Sit     Supine to sit: Independent     General bed mobility comments: Safe technique with no bed rail needed  Transfers Overall transfer level: Needs assistance Equipment used: Rolling walker (2 wheeled) Transfers: Sit to/from Stand Sit to Stand: Min guard         General transfer comment: safe technique performed with RW used. Upright posture noted  Ambulation/Gait Ambulation/Gait assistance: Min guard Ambulation Distance (Feet): 50 Feet Assistive device: Rolling walker (2 wheeled) Gait Pattern/deviations: Step-through pattern     General Gait Details: ambulated using RW and reciprocal gait pattern. Safe technique with fluid gait pattern. Further ambulation performed in ther-ex  Stairs            Wheelchair Mobility    Modified Rankin (Stroke Patients Only)       Balance Overall balance assessment: Needs assistance Sitting-balance support: Feet supported Sitting balance-Leahy Scale: Normal     Standing balance support: Bilateral upper extremity supported Standing balance-Leahy Scale: Fair                   Standardized Balance Assessment Standardized Balance Assessment : Berg Balance Test Berg Balance Test Sit to Stand: Able to stand  independently using hands Standing Unsupported: Able to stand safely 2 minutes Sitting with Back Unsupported but Feet Supported on Floor or Stool: Able to sit safely and securely 2 minutes Stand to Sit: Controls descent by using hands Transfers: Able to transfer safely, minor use of hands Standing Unsupported with Eyes Closed: Able to stand 3 seconds Standing Ubsupported with Feet Together: Able to place feet together independently and stand for 1 minute with supervision From Standing, Reach Forward with Outstretched Arm: Can reach forward >  12 cm safely (5") From Standing Position, Pick up Object from Floor: Able to pick up shoe, needs supervision From Standing Position, Turn to Look Behind Over each Shoulder:  Looks behind from both sides and weight shifts well Turn 360 Degrees: Needs assistance while turning Standing Unsupported, Alternately Place Feet on Step/Stool: Able to complete >2 steps/needs minimal assist Standing Unsupported, One Foot in Front: Able to plae foot ahead of the other independently and hold 30 seconds Standing on One Leg: Able to lift leg independently and hold equal to or more than 3 seconds Total Score: 39         Pertinent Vitals/Pain Pain Assessment: No/denies pain    Home Living Family/patient expects to be discharged to:: Private residence Living Arrangements: Spouse/significant other Available Help at Discharge: Family;Available 24 hours/day Type of Home: House Home Access: Stairs to enter Entrance Stairs-Rails: None Entrance Stairs-Number of Steps: 1 Home Layout: Two level;1/2 bath on main level;Bed/bath upstairs Home Equipment: None      Prior Function Level of Independence: Independent         Comments: Pt indep with ADL, IADL, including driving, no falls in past year reported, has been sleeping in recliner on 1st floor (no reason given) but no difficulty with stairs prior to admission     Hand Dominance   Dominant Hand: Right    Extremity/Trunk Assessment   Upper Extremity Assessment Upper Extremity Assessment: Generalized weakness (R UE distal to elbow 4-/5; decreased coordination)    Lower Extremity Assessment Lower Extremity Assessment: Generalized weakness (R LE grossly 4-/5; coordination intact)       Communication   Communication: No difficulties  Cognition Arousal/Alertness: Awake/alert Behavior During Therapy: WFL for tasks assessed/performed Overall Cognitive Status: Within Functional Limits for tasks assessed                                        General Comments      Exercises Other Exercises Other Exercises: Pt ambulated 100' using no AD demonstrating reciprocal gait pattern and B arm swing. Slight  unsteadiness noted, however no formal LOB noted.   Assessment/Plan    PT Assessment Patient needs continued PT services  PT Problem List Decreased strength;Decreased balance;Decreased mobility       PT Treatment Interventions Gait training;DME instruction;Therapeutic exercise;Balance training    PT Goals (Current goals can be found in the Care Plan section)  Acute Rehab PT Goals Patient Stated Goal: get better PT Goal Formulation: With patient Time For Goal Achievement: 08/02/16 Potential to Achieve Goals: Good Additional Goals Additional Goal #1: Pt will be able to perform 8-10 min of dynamic balance training in order to improve balance and decrease risk for falls.    Frequency 7X/week   Barriers to discharge        Co-evaluation               End of Session Equipment Utilized During Treatment: Gait belt Activity Tolerance: Patient tolerated treatment well Patient left: in chair;with chair alarm set Nurse Communication: Mobility status PT Visit Diagnosis: Unsteadiness on feet (R26.81);Muscle weakness (generalized) (M62.81);Difficulty in walking, not elsewhere classified (R26.2)    Time: 8299-3716 PT Time Calculation (min) (ACUTE ONLY): 27 min   Charges:   PT Evaluation $PT Eval Moderate Complexity: 1 Procedure PT Treatments $Gait Training: 8-22 mins   PT G Codes:        Greggory Stallion, PT, DPT  [phone]   Kourtland Coopman 07/19/2016, 10:41 AM

## 2016-07-19 NOTE — Care Management Note (Signed)
Case Management Note  Patient Details  Name: [name] MRN: [id number] Date of Birth: 02-21-1946  Subjective/Objective:  Discharging today. Acute CVA. Will need home PT and OT. Spoke with patient. She lives at home with her spouse. Prior to admission, she used no DME. She is agreeable with no agency preference. [facility]erral to [facility] for home PT/OT. Also ordered a rolling walker from [facility]. PCP is Ramonita Lab., last seen in the past 6 months.                   Action/Plan: [facility] for walker, PT and OT.   Expected Discharge Date:  07/19/16               Expected Discharge Plan:  [place]  In-House Referral:     Discharge planning Services  CM Consult  Post Acute Care Choice:  Durable Medical Equipment, Home Health Choice offered to:  Patient  DME Arranged:  Walker rolling DME Agency:  [place] Arranged:  PT, OT [facility] Agency:  [facility]  Status of Service:  Completed, signed off  If discussed at [place] of Stay Meetings, dates discussed:    Additional Comments:  Jolly Mango, RN 07/19/2016, 11:16 AM

## 2016-07-19 NOTE — Clinical Social Work Note (Signed)
Pt is ready for discharge today and will return home. CSW updated RNCM of discharge plan. Pt and husband are aware and agreeable to discharge plan. CSW is signing off as no further needs identified.   Darden Dates, MSW, LCSW Clinical Social Worker  [phone]

## 2016-07-19 NOTE — Progress Notes (Signed)
Discussed discharge instructions and medications with patient. IV removed. Patient had concerns about getting test results, Dr. Verdell Carmine paged and spoke with patient over the phone to give her all test results before she discharged. Patient verbalized that she had no additional questions. Pt transported home via car by her husband.  Clarise Cruz, RN

## 2016-07-20 LAB — HEMOGLOBIN A1C
Hgb A1c MFr Bld: 5.6 % (ref 4.8–5.6)
Mean Plasma Glucose: 114 mg/dL

## 2016-07-22 DIAGNOSIS — I69398 Other sequelae of cerebral infarction: Secondary | ICD-10-CM | POA: Diagnosis not present

## 2016-07-22 DIAGNOSIS — I10 Essential (primary) hypertension: Secondary | ICD-10-CM | POA: Diagnosis not present

## 2016-07-22 DIAGNOSIS — E039 Hypothyroidism, unspecified: Secondary | ICD-10-CM | POA: Diagnosis not present

## 2016-07-22 DIAGNOSIS — R2689 Other abnormalities of gait and mobility: Secondary | ICD-10-CM | POA: Diagnosis not present

## 2016-07-22 DIAGNOSIS — Z7982 Long term (current) use of aspirin: Secondary | ICD-10-CM | POA: Diagnosis not present

## 2016-07-22 DIAGNOSIS — Z87891 Personal history of nicotine dependence: Secondary | ICD-10-CM | POA: Diagnosis not present

## 2016-07-22 NOTE — Discharge Summary (Signed)
[facility] [place] NAME: [name]    MR#:  [id number]  [place]:  1945-08-23  DATE OF ADMISSION:  07/17/2016 ADMITTING PHYSICIAN: Hillary Bow, MD  DATE OF DISCHARGE: 07/19/2016 12:25 PM  PRIMARY CARE PHYSICIAN: Tama High III, MD    ADMISSION DIAGNOSIS:  CVA (cerebral vascular accident) ([place]) [I63.9] Cerebrovascular accident (CVA), unspecified mechanism ([place]) [I63.9]  DISCHARGE DIAGNOSIS:  Active Problems:   Acute CVA (cerebrovascular accident) ([place])   SECONDARY DIAGNOSIS:   Past Medical History:  Diagnosis Date  . Depression   . Hyperlipidemia   . Muscle pain   . Neuropathy   . RA (rheumatoid arthritis) ([place])   . Thyroid disease   . Vitamin B 12 deficiency     HOSPITAL COURSE:   71 year old female with past medical history of rheumatoid arthritis, hypertension, hyperlipidemia, neuropathy, hypothyroidism who presented to the hospital due to right upper extremity weakness and numbness.  1. Acute CVA-this was the cause of patient's right sided weakness and numbness. -MRI confirmed a left-sided internal capsule CVA.  -Patient was seen by neurology who recommended dual antiplatelet therapy and therefore patient was started on aspirin, Plavix and also high dose intensity statin. Her carotid duplex showed no hemodynamically significant carotid stenosis, echocardiogram showed no evidence of any intracardiac thrombus. -Patient was seen by physical therapy and recommended home health services which was arranged for her prior to discharge. She will continue her aspirin, Plavix and high dose intensity statin.  2. Hypothyroidism - she will cont. Synthroid.   3. Anxiety - she will cont. Klonopin, Paxil.  4. Hx of RA - she will cont. Methotrexate.  NO acute issue.   DISCHARGE CONDITIONS:   Stable.   CONSULTS OBTAINED:  Treatment Team:  Alexis Goodell, MD  DRUG ALLERGIES:   Allergies  Allergen Reactions  .  Codeine Nausea And Vomiting and Other (See Comments)  . Demerol [Meperidine] Nausea And Vomiting    DISCHARGE MEDICATIONS:   Allergies as of 07/19/2016      Reactions   Codeine Nausea And Vomiting, Other (See Comments)   Demerol [meperidine] Nausea And Vomiting      Medication List    TAKE these medications   aspirin EC 81 MG tablet Take 81 mg by mouth daily.   atorvastatin 40 MG tablet Commonly known as:  LIPITOR Take 1 tablet (40 mg total) by mouth daily at 6 PM.   clonazePAM 0.5 MG tablet Commonly known as:  KLONOPIN Take 0.5 mg by mouth 2 (two) times daily as needed.   clopidogrel 75 MG tablet Commonly known as:  PLAVIX Take 1 tablet (75 mg total) by mouth daily.   folic acid 1 MG tablet Commonly known as:  FOLVITE Take 1 mg by mouth daily.   HUMIRA 40 MG/0.8ML Pskt Generic drug:  Adalimumab Inject 40 mg into the skin every 14 (fourteen) days.   lamoTRIgine 25 MG tablet Commonly known as:  LAMICTAL Take 25 mg by mouth daily.   levothyroxine 112 MCG tablet Commonly known as:  SYNTHROID, LEVOTHROID Take 112 mcg by mouth daily.   methotrexate 2.5 MG tablet Commonly known as:  RHEUMATREX Take 25 mg by mouth once a week.   PARoxetine 20 MG tablet Commonly known as:  PAXIL Take 20 mg by mouth at bedtime.   VITAMIN D (ERGOCALCIFEROL) PO Take 1 tablet by mouth daily.         DISCHARGE INSTRUCTIONS:   DIET:  Cardiac diet  DISCHARGE  CONDITION:  Stable  ACTIVITY:  Activity as tolerated  OXYGEN:  Home Oxygen: No.   Oxygen Delivery: room air  DISCHARGE LOCATION:  Home with Home health PT.     If you experience worsening of your admission symptoms, develop shortness of breath, life threatening emergency, suicidal or homicidal thoughts you must seek medical attention immediately by calling 911 or calling your MD immediately  if symptoms less severe.  You Must read complete instructions/literature along with all the possible adverse reactions/side  effects for all the Medicines you take and that have been prescribed to you. Take any new Medicines after you have completely understood and accpet all the possible adverse reactions/side effects.   Please note  You were cared for by a hospitalist during your hospital stay. If you have any questions about your discharge medications or the care you received while you were in the hospital after you are discharged, you can call the unit and asked to speak with the hospitalist on call if the hospitalist that took care of you is not available. Once you are discharged, your primary care physician will handle any further medical issues. Please note that NO REFILLS for any discharge medications will be authorized once you are discharged, as it is imperative that you return to your primary care physician (or establish a relationship with a primary care physician if you do not have one) for your aftercare needs so that they can reassess your need for medications and monitor your lab values.     Today   Weakness on right side has improved. NO headache and no other complaints. Husband at bedside.   VITAL SIGNS:  Blood pressure (!) 150/76, pulse (!) 58, temperature 98.3 F (36.8 C), temperature source Oral, resp. rate 18, height 5\' 5"  (1.651 m), weight 73.2 kg (161 lb 4.8 oz), SpO2 95 %.  I/O:  No intake or output data in the 24 hours ending 07/22/16 1656  PHYSICAL EXAMINATION:   GENERAL:  71 y.o.-year-old patient lying in bed in no acute distress.  EYES: Pupils equal, round, reactive to light and accommodation. No scleral icterus. Extraocular muscles intact.  HEENT: Head atraumatic, normocephalic. Oropharynx and nasopharynx clear.  NECK:  Supple, no jugular venous distention. No thyroid enlargement, no tenderness.  LUNGS: Normal breath sounds bilaterally, no wheezing, rales, rhonchi. No use of accessory muscles of respiration.  CARDIOVASCULAR: S1, S2 normal. No murmurs, rubs, or gallops.  ABDOMEN:  Soft, nontender, nondistended. Bowel sounds present. No organomegaly or mass.  EXTREMITIES: No cyanosis, clubbing or edema b/l.    NEUROLOGIC: Cranial nerves II through XII are intact. Right upper and lower extremity weakness. 4 out of 5 strength in right upper extremity, 4 out of 5 in the right lower extremity.   PSYCHIATRIC: The patient is alert and oriented x 3.  SKIN: No obvious rash, lesion, or ulcer.   DATA REVIEW:   CBC  Recent Labs Lab 07/17/16 1415  WBC 6.8  HGB 15.2  HCT 45.8  PLT 294    Chemistries   Recent Labs Lab 07/17/16 1415  NA 141  K 3.4*  CL 108  CO2 24  GLUCOSE 136*  BUN 9  CREATININE 0.79  CALCIUM 9.1  AST 20  ALT 10*  ALKPHOS 98  BILITOT 0.7    Cardiac Enzymes  Recent Labs Lab 07/17/16 1415  TROPONINI <0.03    Microbiology Results  No results found for this or any previous visit.  RADIOLOGY:  No results found.    Management  plans discussed with the patient, family and they are in agreement.  CODE STATUS:  Code Status History    Date Active Date Inactive Code Status Order ID Comments User Context   07/17/2016  7:44 PM 07/19/2016  3:30 PM Full Code [id number]  Hillary Bow, MD ED    Advance Directive Documentation     Most Recent Value  Type of Advance Directive  Living will, Healthcare Power of Attorney  Pre-existing out of facility DNR order (yellow form or pink MOST form)  -  "MOST" Form in Place?  -      TOTAL TIME TAKING CARE OF THIS PATIENT: 40 minutes.    Henreitta Leber M.D on 07/22/2016 at 4:56 PM  Between 7am to 6pm - Pager - [phone]  After 6pm go to [email or web address] - Proofreader  [facility]  Office  [phone]  CC: Primary care physician; Adin Hector, MD

## 2016-07-30 DIAGNOSIS — Z87891 Personal history of nicotine dependence: Secondary | ICD-10-CM | POA: Diagnosis not present

## 2016-07-30 DIAGNOSIS — E039 Hypothyroidism, unspecified: Secondary | ICD-10-CM | POA: Diagnosis not present

## 2016-07-30 DIAGNOSIS — Z7982 Long term (current) use of aspirin: Secondary | ICD-10-CM | POA: Diagnosis not present

## 2016-07-30 DIAGNOSIS — R2689 Other abnormalities of gait and mobility: Secondary | ICD-10-CM | POA: Diagnosis not present

## 2016-07-30 DIAGNOSIS — I10 Essential (primary) hypertension: Secondary | ICD-10-CM | POA: Diagnosis not present

## 2016-07-30 DIAGNOSIS — I69398 Other sequelae of cerebral infarction: Secondary | ICD-10-CM | POA: Diagnosis not present

## 2016-07-31 DIAGNOSIS — F3341 Major depressive disorder, recurrent, in partial remission: Secondary | ICD-10-CM | POA: Diagnosis not present

## 2016-07-31 DIAGNOSIS — I779 Disorder of arteries and arterioles, unspecified: Secondary | ICD-10-CM | POA: Insufficient documentation

## 2016-07-31 DIAGNOSIS — E034 Atrophy of thyroid (acquired): Secondary | ICD-10-CM | POA: Diagnosis not present

## 2016-07-31 DIAGNOSIS — I639 Cerebral infarction, unspecified: Secondary | ICD-10-CM | POA: Diagnosis not present

## 2016-07-31 DIAGNOSIS — R7309 Other abnormal glucose: Secondary | ICD-10-CM | POA: Diagnosis not present

## 2016-07-31 DIAGNOSIS — M0579 Rheumatoid arthritis with rheumatoid factor of multiple sites without organ or systems involvement: Secondary | ICD-10-CM | POA: Diagnosis not present

## 2016-07-31 DIAGNOSIS — E784 Other hyperlipidemia: Secondary | ICD-10-CM | POA: Diagnosis not present

## 2016-07-31 DIAGNOSIS — G4733 Obstructive sleep apnea (adult) (pediatric): Secondary | ICD-10-CM | POA: Diagnosis not present

## 2016-09-04 DIAGNOSIS — E784 Other hyperlipidemia: Secondary | ICD-10-CM | POA: Diagnosis not present

## 2016-09-04 DIAGNOSIS — M150 Primary generalized (osteo)arthritis: Secondary | ICD-10-CM | POA: Diagnosis not present

## 2016-09-04 DIAGNOSIS — I779 Disorder of arteries and arterioles, unspecified: Secondary | ICD-10-CM | POA: Diagnosis not present

## 2016-09-04 DIAGNOSIS — G4733 Obstructive sleep apnea (adult) (pediatric): Secondary | ICD-10-CM | POA: Diagnosis not present

## 2016-09-04 DIAGNOSIS — R05 Cough: Secondary | ICD-10-CM | POA: Diagnosis not present

## 2016-09-04 DIAGNOSIS — F3341 Major depressive disorder, recurrent, in partial remission: Secondary | ICD-10-CM | POA: Diagnosis not present

## 2016-09-04 DIAGNOSIS — R7309 Other abnormal glucose: Secondary | ICD-10-CM | POA: Diagnosis not present

## 2016-09-04 DIAGNOSIS — M0579 Rheumatoid arthritis with rheumatoid factor of multiple sites without organ or systems involvement: Secondary | ICD-10-CM | POA: Diagnosis not present

## 2016-09-04 DIAGNOSIS — E034 Atrophy of thyroid (acquired): Secondary | ICD-10-CM | POA: Diagnosis not present

## 2016-09-23 DIAGNOSIS — R2689 Other abnormalities of gait and mobility: Secondary | ICD-10-CM | POA: Diagnosis not present

## 2016-09-23 DIAGNOSIS — I10 Essential (primary) hypertension: Secondary | ICD-10-CM | POA: Diagnosis not present

## 2016-09-23 DIAGNOSIS — E039 Hypothyroidism, unspecified: Secondary | ICD-10-CM | POA: Diagnosis not present

## 2016-09-23 DIAGNOSIS — I69398 Other sequelae of cerebral infarction: Secondary | ICD-10-CM | POA: Diagnosis not present

## 2016-10-22 DIAGNOSIS — E034 Atrophy of thyroid (acquired): Secondary | ICD-10-CM | POA: Diagnosis not present

## 2016-10-22 DIAGNOSIS — M0579 Rheumatoid arthritis with rheumatoid factor of multiple sites without organ or systems involvement: Secondary | ICD-10-CM | POA: Diagnosis not present

## 2016-10-22 DIAGNOSIS — R7309 Other abnormal glucose: Secondary | ICD-10-CM | POA: Diagnosis not present

## 2016-10-22 DIAGNOSIS — M150 Primary generalized (osteo)arthritis: Secondary | ICD-10-CM | POA: Diagnosis not present

## 2016-10-22 DIAGNOSIS — E784 Other hyperlipidemia: Secondary | ICD-10-CM | POA: Diagnosis not present

## 2016-10-29 DIAGNOSIS — E784 Other hyperlipidemia: Secondary | ICD-10-CM | POA: Diagnosis not present

## 2016-10-29 DIAGNOSIS — F3341 Major depressive disorder, recurrent, in partial remission: Secondary | ICD-10-CM | POA: Diagnosis not present

## 2016-10-29 DIAGNOSIS — M150 Primary generalized (osteo)arthritis: Secondary | ICD-10-CM | POA: Diagnosis not present

## 2016-10-29 DIAGNOSIS — G4733 Obstructive sleep apnea (adult) (pediatric): Secondary | ICD-10-CM | POA: Diagnosis not present

## 2016-10-29 DIAGNOSIS — R7309 Other abnormal glucose: Secondary | ICD-10-CM | POA: Diagnosis not present

## 2016-10-29 DIAGNOSIS — I779 Disorder of arteries and arterioles, unspecified: Secondary | ICD-10-CM | POA: Diagnosis not present

## 2016-10-29 DIAGNOSIS — E034 Atrophy of thyroid (acquired): Secondary | ICD-10-CM | POA: Diagnosis not present

## 2016-10-29 DIAGNOSIS — M0579 Rheumatoid arthritis with rheumatoid factor of multiple sites without organ or systems involvement: Secondary | ICD-10-CM | POA: Diagnosis not present

## 2016-11-06 DIAGNOSIS — M150 Primary generalized (osteo)arthritis: Secondary | ICD-10-CM | POA: Diagnosis not present

## 2016-11-06 DIAGNOSIS — E034 Atrophy of thyroid (acquired): Secondary | ICD-10-CM | POA: Diagnosis not present

## 2016-11-06 DIAGNOSIS — Z1231 Encounter for screening mammogram for malignant neoplasm of breast: Secondary | ICD-10-CM | POA: Diagnosis not present

## 2016-11-06 DIAGNOSIS — F3341 Major depressive disorder, recurrent, in partial remission: Secondary | ICD-10-CM | POA: Diagnosis not present

## 2016-11-06 DIAGNOSIS — R7309 Other abnormal glucose: Secondary | ICD-10-CM | POA: Diagnosis not present

## 2016-11-06 DIAGNOSIS — M0579 Rheumatoid arthritis with rheumatoid factor of multiple sites without organ or systems involvement: Secondary | ICD-10-CM | POA: Diagnosis not present

## 2016-11-06 DIAGNOSIS — E784 Other hyperlipidemia: Secondary | ICD-10-CM | POA: Diagnosis not present

## 2016-11-06 DIAGNOSIS — G4733 Obstructive sleep apnea (adult) (pediatric): Secondary | ICD-10-CM | POA: Diagnosis not present

## 2016-11-06 DIAGNOSIS — I779 Disorder of arteries and arterioles, unspecified: Secondary | ICD-10-CM | POA: Diagnosis not present

## 2017-05-06 DIAGNOSIS — R7309 Other abnormal glucose: Secondary | ICD-10-CM | POA: Diagnosis not present

## 2017-05-06 DIAGNOSIS — M0579 Rheumatoid arthritis with rheumatoid factor of multiple sites without organ or systems involvement: Secondary | ICD-10-CM | POA: Diagnosis not present

## 2017-05-06 DIAGNOSIS — I779 Disorder of arteries and arterioles, unspecified: Secondary | ICD-10-CM | POA: Diagnosis not present

## 2017-05-06 DIAGNOSIS — E034 Atrophy of thyroid (acquired): Secondary | ICD-10-CM | POA: Diagnosis not present

## 2017-05-13 DIAGNOSIS — M0579 Rheumatoid arthritis with rheumatoid factor of multiple sites without organ or systems involvement: Secondary | ICD-10-CM | POA: Diagnosis not present

## 2017-05-13 DIAGNOSIS — E034 Atrophy of thyroid (acquired): Secondary | ICD-10-CM | POA: Diagnosis not present

## 2017-05-13 DIAGNOSIS — G4733 Obstructive sleep apnea (adult) (pediatric): Secondary | ICD-10-CM | POA: Diagnosis not present

## 2017-05-13 DIAGNOSIS — I779 Disorder of arteries and arterioles, unspecified: Secondary | ICD-10-CM | POA: Diagnosis not present

## 2017-05-13 DIAGNOSIS — M150 Primary generalized (osteo)arthritis: Secondary | ICD-10-CM | POA: Diagnosis not present

## 2017-05-13 DIAGNOSIS — Z0000 Encounter for general adult medical examination without abnormal findings: Secondary | ICD-10-CM | POA: Diagnosis not present

## 2017-05-13 DIAGNOSIS — E559 Vitamin D deficiency, unspecified: Secondary | ICD-10-CM | POA: Diagnosis not present

## 2017-05-13 DIAGNOSIS — R7309 Other abnormal glucose: Secondary | ICD-10-CM | POA: Diagnosis not present

## 2017-05-13 DIAGNOSIS — E7849 Other hyperlipidemia: Secondary | ICD-10-CM | POA: Diagnosis not present

## 2017-05-13 DIAGNOSIS — F3341 Major depressive disorder, recurrent, in partial remission: Secondary | ICD-10-CM | POA: Diagnosis not present

## 2017-05-29 ENCOUNTER — Ambulatory Visit
Admission: RE | Admit: 2017-05-29 | Discharge: 2017-05-29 | Disposition: A | Discharge status: Home / Self Care | Payer: PPO | Source: Ambulatory Visit | Attending: Internal Medicine | Admitting: Internal Medicine

## 2017-05-29 DIAGNOSIS — Z1231 Encounter for screening mammogram for malignant neoplasm of breast: Secondary | ICD-10-CM | POA: Insufficient documentation

## 2017-07-09 DIAGNOSIS — F5105 Insomnia due to other mental disorder: Secondary | ICD-10-CM | POA: Diagnosis not present

## 2017-07-09 DIAGNOSIS — F411 Generalized anxiety disorder: Secondary | ICD-10-CM | POA: Diagnosis not present

## 2017-07-09 DIAGNOSIS — F331 Major depressive disorder, recurrent, moderate: Secondary | ICD-10-CM | POA: Diagnosis not present

## 2017-07-23 DIAGNOSIS — F331 Major depressive disorder, recurrent, moderate: Secondary | ICD-10-CM | POA: Diagnosis not present

## 2017-07-23 DIAGNOSIS — F5105 Insomnia due to other mental disorder: Secondary | ICD-10-CM | POA: Diagnosis not present

## 2017-07-23 DIAGNOSIS — F411 Generalized anxiety disorder: Secondary | ICD-10-CM | POA: Diagnosis not present

## 2017-08-20 DIAGNOSIS — F411 Generalized anxiety disorder: Secondary | ICD-10-CM | POA: Diagnosis not present

## 2017-08-20 DIAGNOSIS — F5105 Insomnia due to other mental disorder: Secondary | ICD-10-CM | POA: Diagnosis not present

## 2017-08-20 DIAGNOSIS — F331 Major depressive disorder, recurrent, moderate: Secondary | ICD-10-CM | POA: Diagnosis not present

## 2017-09-10 DIAGNOSIS — F411 Generalized anxiety disorder: Secondary | ICD-10-CM | POA: Diagnosis not present

## 2017-09-10 DIAGNOSIS — F5105 Insomnia due to other mental disorder: Secondary | ICD-10-CM | POA: Diagnosis not present

## 2017-09-10 DIAGNOSIS — F331 Major depressive disorder, recurrent, moderate: Secondary | ICD-10-CM | POA: Diagnosis not present

## 2017-09-24 DIAGNOSIS — F411 Generalized anxiety disorder: Secondary | ICD-10-CM | POA: Diagnosis not present

## 2017-09-24 DIAGNOSIS — F331 Major depressive disorder, recurrent, moderate: Secondary | ICD-10-CM | POA: Diagnosis not present

## 2017-09-24 DIAGNOSIS — F5105 Insomnia due to other mental disorder: Secondary | ICD-10-CM | POA: Diagnosis not present

## 2017-10-22 DIAGNOSIS — F411 Generalized anxiety disorder: Secondary | ICD-10-CM | POA: Diagnosis not present

## 2017-10-22 DIAGNOSIS — F5105 Insomnia due to other mental disorder: Secondary | ICD-10-CM | POA: Diagnosis not present

## 2017-10-22 DIAGNOSIS — F331 Major depressive disorder, recurrent, moderate: Secondary | ICD-10-CM | POA: Diagnosis not present

## 2017-11-10 DIAGNOSIS — R7309 Other abnormal glucose: Secondary | ICD-10-CM | POA: Diagnosis not present

## 2017-11-10 DIAGNOSIS — I779 Disorder of arteries and arterioles, unspecified: Secondary | ICD-10-CM | POA: Diagnosis not present

## 2017-11-10 DIAGNOSIS — M0579 Rheumatoid arthritis with rheumatoid factor of multiple sites without organ or systems involvement: Secondary | ICD-10-CM | POA: Diagnosis not present

## 2017-11-10 DIAGNOSIS — E559 Vitamin D deficiency, unspecified: Secondary | ICD-10-CM | POA: Diagnosis not present

## 2017-11-10 DIAGNOSIS — E7849 Other hyperlipidemia: Secondary | ICD-10-CM | POA: Diagnosis not present

## 2017-11-10 DIAGNOSIS — E034 Atrophy of thyroid (acquired): Secondary | ICD-10-CM | POA: Diagnosis not present

## 2017-11-13 DIAGNOSIS — M79641 Pain in right hand: Secondary | ICD-10-CM | POA: Diagnosis not present

## 2017-11-13 DIAGNOSIS — E559 Vitamin D deficiency, unspecified: Secondary | ICD-10-CM | POA: Diagnosis not present

## 2017-11-13 DIAGNOSIS — M65331 Trigger finger, right middle finger: Secondary | ICD-10-CM | POA: Diagnosis not present

## 2017-11-13 DIAGNOSIS — R0781 Pleurodynia: Secondary | ICD-10-CM | POA: Diagnosis not present

## 2017-11-13 DIAGNOSIS — R7309 Other abnormal glucose: Secondary | ICD-10-CM | POA: Diagnosis not present

## 2017-11-13 DIAGNOSIS — F3341 Major depressive disorder, recurrent, in partial remission: Secondary | ICD-10-CM | POA: Diagnosis not present

## 2017-11-13 DIAGNOSIS — M0579 Rheumatoid arthritis with rheumatoid factor of multiple sites without organ or systems involvement: Secondary | ICD-10-CM | POA: Diagnosis not present

## 2017-11-13 DIAGNOSIS — G4733 Obstructive sleep apnea (adult) (pediatric): Secondary | ICD-10-CM | POA: Diagnosis not present

## 2017-11-13 DIAGNOSIS — M79642 Pain in left hand: Secondary | ICD-10-CM | POA: Diagnosis not present

## 2017-11-13 DIAGNOSIS — E7849 Other hyperlipidemia: Secondary | ICD-10-CM | POA: Diagnosis not present

## 2017-11-13 DIAGNOSIS — E034 Atrophy of thyroid (acquired): Secondary | ICD-10-CM | POA: Diagnosis not present

## 2017-11-13 DIAGNOSIS — I779 Disorder of arteries and arterioles, unspecified: Secondary | ICD-10-CM | POA: Diagnosis not present

## 2017-11-19 DIAGNOSIS — F331 Major depressive disorder, recurrent, moderate: Secondary | ICD-10-CM | POA: Diagnosis not present

## 2017-11-19 DIAGNOSIS — F5105 Insomnia due to other mental disorder: Secondary | ICD-10-CM | POA: Diagnosis not present

## 2017-11-19 DIAGNOSIS — F411 Generalized anxiety disorder: Secondary | ICD-10-CM | POA: Diagnosis not present

## 2017-11-21 ENCOUNTER — Ambulatory Visit: Payer: PPO | Attending: Rheumatology | Admitting: Occupational Therapy

## 2017-11-21 ENCOUNTER — Other Ambulatory Visit: Payer: Self-pay

## 2017-11-21 DIAGNOSIS — M25641 Stiffness of right hand, not elsewhere classified: Secondary | ICD-10-CM | POA: Diagnosis not present

## 2017-11-21 DIAGNOSIS — M25642 Stiffness of left hand, not elsewhere classified: Secondary | ICD-10-CM | POA: Diagnosis not present

## 2017-11-21 DIAGNOSIS — G5622 Lesion of ulnar nerve, left upper limb: Secondary | ICD-10-CM | POA: Diagnosis not present

## 2017-11-21 NOTE — Patient Instructions (Signed)
L arm - elbow pad to decrease pressure on cubital tunnel and prevent flexion more than 90 degrees Oval 8 on 4th PIP to decrease swaanneck - size 5 - Order Oval 8 size 4 for 5th digit  wear during day time   Ulnar N glide for L arm 5 reps   2-3 x day  R hand wear at night time - hand base digits extention splint for 3rd thru 5th to decrease flexor contracture - until pt decide if want to have surgery done

## 2017-11-21 NOTE — Therapy (Signed)
[facility] [address], [place], [address] Phone: [phone]   Fax:  [phone]  Occupational Therapy Evaluation  Patient Details  Name: [name] MRN: [id number] Date of Birth: [birth year] Referring Provider: Jefm Bryant   Encounter Date: 11/21/2017  OT End of Session - 11/21/17 1231    Visit Number  1    Number of Visits  7    Date for OT Re-Evaluation  12/26/17    OT Start Time  1127    OT Stop Time  1222    OT Time Calculation (min)  55 min    Activity Tolerance  Patient tolerated treatment well    Behavior During Therapy  [facility] for tasks assessed/performed       Past Medical History:  Diagnosis Date  . Depression   . Hyperlipidemia   . Muscle pain   . Neuropathy   . RA (rheumatoid arthritis) ([place])   . Thyroid disease   . Vitamin B 12 deficiency     Past Surgical History:  Procedure Laterality Date  . ABDOMINAL HYSTERECTOMY    . BREAST BIOPSY Left    2 benign biopsies  . ECTOPIC PREGNANCY SURGERY    . KNEE SURGERY Right   . LAMINECTOMY    . leg vein stripping Bilateral   . TONSILLECTOMY      There were no vitals filed for this visit.  Subjective Assessment - 11/21/17 1226    Subjective   I had RA for long time- my hands got gradually worse - cannot straighten my R hand fingers anymore- I do try and keep them flexible -and then my L ring and pinkie finger snaps and my pinkie is numb     Patient Stated Goals  Want to get my fingers on the R open so I can grasp the steering wheel, let go of objects , and prevent my hands to get worse - numbness better on the L pinkie     Currently in Pain?  No/denies        [facility] OT Assessment - 11/21/17 0001      Assessment   Medical Diagnosis  bilateral hand pain and RA     Referring Provider  kernodle    Onset Date/Surgical Date  11/13/17    Hand Dominance  Right      Home  Environment   Lives With  Spouse      Prior Function   Vocation  Retired    Leisure  Oceanographer, do some house work       Right Hand AROM   R Index  MCP 0-90  -30 Degrees    R Long  MCP 0-90  -55 Degrees    R Ring  MCP 0-90  -80 Degrees    R Little  MCP 0-90  -90 Degrees      Left Hand AROM   L Ring PIP 0-100  --   [place] - clicking of PIP   L Little PIP 0-100  --   [place] - clicking         L arm - elbow pad to decrease pressure on cubital tunnel and prevent flexion more than 90 degrees Oval 8 on 4th PIP to decrease swaanneck - size 5 - Order Oval 8 size 4 for 5th digit  wear during day time   Ulnar N glide for L arm 5 reps   2-3 x day  R hand wear at night time -  hand base digits extention splint for 3rd thru 5th to decrease flexor contracture - until pt decide if want to do surgery              OT Education - 11/21/17 1231    Education Details  findings of eval - POC and HEP     Person(s) Educated  Patient    Methods  Explanation;Demonstration;Handout    Comprehension  Verbalized understanding;Returned demonstration       OT Short Term Goals - 11/21/17 2229      OT SHORT TERM GOAL #1   Title  Pt to be independent in use of hand base extention splint R hand , oval 8 on L and elbow pad for ulnar N compression at cubital tunnel    Baseline  no knowledge     Time  2    Period  Weeks    Status  New    Target Date  12/05/17        OT Long Term Goals - 11/21/17 2229      OT LONG TERM GOAL #1   Title  Pt to report decrease numbness in L hand 5th digit and no clicking with use of oval 8 on 4th     Baseline  constant numbness in L 5th , clicking of 4th with fisting and 5th     Time  4    Period  Weeks    Status  New    Target Date  12/19/17            Plan - 11/21/17 2219    Clinical Impression Statement  Pt present with diagnosis of RA that she had for years - pt show subluxation of R hand 3rd thru 5th extensor tendons at [facility] - but able to do full PROM extention - L hand show  S[place]  at 4th  and 5th  causing some clicking at 4thand 5th - pt fitted with Oval 8 splints to decrease hyper extention of 4th - pt also report numbness in L 5th - positive tinel at cubital tunnel and tenderness - pt ed on positioning of L arm - pt report she lean on her elbow a lot - fabricated hand base exention splint for R 3rd thru 5th to keep from getting flexor contracture - pt would need consult with hand surgeon to correct subluxation in future .     Occupational performance deficits (Please refer to evaluation for details):  ADL's;IADL's;Leisure    Rehab Potential  Fair    Current Impairments/barriers affecting progress:  subluxation of extensor tendons on R hand - need surgery in future     OT Frequency  1x / week    OT Duration  4 weeks    OT Treatment/Interventions  Therapeutic exercise;Patient/family education;Splinting;Iontophoresis    Plan  Assess progress with ulnar N glide , splint use on R hand , L 4th and elbow pad for Ulnar N     Clinical Decision Making  Several treatment options, min-mod task modification necessary    OT Home Exercise Plan  see pt instruction    Consulted and Agree with Plan of Care  Patient       Patient will benefit from skilled therapeutic intervention in order to improve the following deficits and impairments:  Impaired flexibility, Decreased coordination, Impaired UE functional use, Decreased range of motion  Visit Diagnosis: Stiffness of right hand, not elsewhere classified - Plan: Ot plan of care cert/re-cert  Stiffness of left hand, not elsewhere  classified - Plan: Ot plan of care cert/re-cert  Cubital tunnel syndrome on left - Plan: Ot plan of care cert/re-cert    Problem List Patient Active Problem List   Diagnosis Date Noted  . Acute CVA (cerebrovascular accident) ([place]) 07/17/2016  . Rheumatoid arthritis with rheumatoid factor ([place]) 09/20/2013    Rosalyn Gess OTR/L,CLT 11/21/2017, 10:36 PM  [facility] [address] [place], [address] Phone: [phone]   Fax:  [phone]  Name: [name] MRN: [id number] Date of Birth: 28-Nov-1945

## 2017-12-05 ENCOUNTER — Ambulatory Visit: Payer: PPO | Attending: Rheumatology | Admitting: Occupational Therapy

## 2017-12-05 DIAGNOSIS — G5622 Lesion of ulnar nerve, left upper limb: Secondary | ICD-10-CM | POA: Diagnosis not present

## 2017-12-05 DIAGNOSIS — M25641 Stiffness of right hand, not elsewhere classified: Secondary | ICD-10-CM | POA: Insufficient documentation

## 2017-12-05 DIAGNOSIS — M25642 Stiffness of left hand, not elsewhere classified: Secondary | ICD-10-CM | POA: Diagnosis not present

## 2017-12-05 DIAGNOSIS — E034 Atrophy of thyroid (acquired): Secondary | ICD-10-CM | POA: Diagnosis not present

## 2017-12-05 DIAGNOSIS — E559 Vitamin D deficiency, unspecified: Secondary | ICD-10-CM | POA: Diagnosis not present

## 2017-12-05 NOTE — Therapy (Signed)
[facility] [address] [place], [address] Phone: [phone]   Fax:  [phone]  Occupational Therapy Treatment  Patient Details  Name: [name] MRN: [id number] Date of Birth: 1945/12/27 Referring Provider: Jefm Bryant   Encounter Date: 12/05/2017  OT End of Session - 12/05/17 1401    Visit Number  2    Number of Visits  7    Date for OT Re-Evaluation  12/26/17    OT Start Time  1050    OT Stop Time  1135    OT Time Calculation (min)  45 min    Activity Tolerance  Patient tolerated treatment well    Behavior During Therapy  [facility] for tasks assessed/performed       Past Medical History:  Diagnosis Date  . Depression   . Hyperlipidemia   . Muscle pain   . Neuropathy   . RA (rheumatoid arthritis) ([place])   . Thyroid disease   . Vitamin B 12 deficiency     Past Surgical History:  Procedure Laterality Date  . ABDOMINAL HYSTERECTOMY    . BREAST BIOPSY Left    2 benign biopsies  . ECTOPIC PREGNANCY SURGERY    . KNEE SURGERY Right   . LAMINECTOMY    . leg vein stripping Bilateral   . TONSILLECTOMY      There were no vitals filed for this visit.  Subjective Assessment - 12/05/17 1359    Subjective   I did sleep with the splint on my R hand - did not decide if I want to do surgery - and the oval 8 feels good on ring finger - did you get the size for small finger yet- my finger not clicking as much - and then pinkie on the L still numb but not as much     Patient Stated Goals  Want to get my fingers on the R open so I can grasp the steering wheel, let go of objects , and prevent my hands to get worse - numbness better on the L pinkie     Currently in Pain?  No/denies      Assess R hand night time  hand base digits extention splint for 3rd thru 5th to decrease flexor contracture - until pt decide if want to have surgery done   fit well and pt wearing it with no issues    L arm - elbow pad to  decrease pressure on cubital tunnel and prevent flexion more than 90 degrees - to cont with - pt report numbness little better   but pt still positive Tinel and tender   Oval 8 on 4th PIP to decrease [place] - size 5 - fits well and working well  Order Oval 8 size 4 for 5th digit - did not come in yet   wear during day time   Ulnar N glide for L arm 5 reps   2-3 x day         Skin check done and pt ed on what to expect    OT Treatments/Exercises (OP) - 12/05/17 0001      Iontophoresis   Type of Iontophoresis  Dexamethasone    Location  L cubital tunnel    Dose  med patch , 2.0 current    Time  19      patches removed and skin check done - no issues        OT Education - 12/05/17 1401  Education Details  ionto use and what to expect     Person(s) Educated  Patient    Methods  Explanation;Demonstration    Comprehension  Returned demonstration       OT Short Term Goals - 11/21/17 2229      OT SHORT TERM GOAL #1   Title  Pt to be independent in use of hand base extention splint R hand , oval 8 on L and elbow pad for ulnar N compression at cubital tunnel    Baseline  no knowledge     Time  2    Period  Weeks    Status  New    Target Date  12/05/17        OT Long Term Goals - 11/21/17 2229      OT LONG TERM GOAL #1   Title  Pt to report decrease numbness in L hand 5th digit and no clicking with use of oval 8 on 4th     Baseline  constant numbness in L 5th , clicking of 4th with fisting and 5th     Time  4    Period  Weeks    Status  New    Target Date  12/19/17            Plan - 12/05/17 1402    Clinical Impression Statement  Pt present with cont subluxation of R hand 3rd thru 5th extensor tendons at [facility] - did wear night time extention splint to prevent flexor contractures until she decide if she going to do surgery - Oval 8 on L 4th doing well and no clicking and preventing  [place] - await correct size for 5th - pt cont to have numbness in  5th digit and tenderness and positive Tinel at L cubital tunnel - done ionto with dexamethazone this date     Occupational performance deficits (Please refer to evaluation for details):  ADL's;IADL's;Leisure    Rehab Potential  Fair    Current Impairments/barriers affecting progress:  subluxation of extensor tendons on R hand - need surgery in future     OT Frequency  1x / week    OT Duration  4 weeks    OT Treatment/Interventions  Therapeutic exercise;Patient/family education;Splinting;Iontophoresis    Plan  Assess progress with ulnar N glide , splint use on R hand , L 4th and elbow pad for Ulnar N     Clinical Decision Making  Several treatment options, min-mod task modification necessary    OT Home Exercise Plan  see pt instruction    Consulted and Agree with Plan of Care  Patient       Patient will benefit from skilled therapeutic intervention in order to improve the following deficits and impairments:  Impaired flexibility, Decreased coordination, Impaired UE functional use, Decreased range of motion  Visit Diagnosis: Stiffness of right hand, not elsewhere classified  Stiffness of left hand, not elsewhere classified  Cubital tunnel syndrome on left    Problem List Patient Active Problem List   Diagnosis Date Noted  . Acute CVA (cerebrovascular accident) ([place]) 07/17/2016  . Rheumatoid arthritis with rheumatoid factor ([place]) 09/20/2013    Rosalyn Gess OTR/L,CLT 12/05/2017, 2:06 PM  [facility] [address] [place], [address] Phone: [phone]   Fax:  [phone]  Name: [name] MRN: [id number] Date of Birth: Aug 06, 1945

## 2017-12-05 NOTE — Patient Instructions (Signed)
Same as before.

## 2017-12-11 ENCOUNTER — Ambulatory Visit: Payer: PPO | Admitting: Occupational Therapy

## 2017-12-11 DIAGNOSIS — R7309 Other abnormal glucose: Secondary | ICD-10-CM | POA: Diagnosis not present

## 2017-12-11 DIAGNOSIS — F3341 Major depressive disorder, recurrent, in partial remission: Secondary | ICD-10-CM | POA: Diagnosis not present

## 2017-12-11 DIAGNOSIS — E559 Vitamin D deficiency, unspecified: Secondary | ICD-10-CM | POA: Diagnosis not present

## 2017-12-11 DIAGNOSIS — M0579 Rheumatoid arthritis with rheumatoid factor of multiple sites without organ or systems involvement: Secondary | ICD-10-CM | POA: Diagnosis not present

## 2017-12-11 DIAGNOSIS — E7849 Other hyperlipidemia: Secondary | ICD-10-CM | POA: Diagnosis not present

## 2017-12-11 DIAGNOSIS — G4733 Obstructive sleep apnea (adult) (pediatric): Secondary | ICD-10-CM | POA: Diagnosis not present

## 2017-12-11 DIAGNOSIS — I779 Disorder of arteries and arterioles, unspecified: Secondary | ICD-10-CM | POA: Diagnosis not present

## 2017-12-11 DIAGNOSIS — M150 Primary generalized (osteo)arthritis: Secondary | ICD-10-CM | POA: Diagnosis not present

## 2017-12-11 DIAGNOSIS — E034 Atrophy of thyroid (acquired): Secondary | ICD-10-CM | POA: Diagnosis not present

## 2017-12-12 ENCOUNTER — Other Ambulatory Visit: Payer: Self-pay | Admitting: Pharmacist

## 2017-12-12 NOTE — Patient Outreach (Signed)
[facility]) Care Management  12/12/2017  [name] 1946-03-03 [id number]   Incoming call from [name] in response to the [facility] Medication Adherence Campaign. Speak with patient. HIPAA identifiers verified and verbal consent received.  [name] reports that she takes her atorvastatin 40 mg daily as directed. Reports that she may have been missing some doses. Counsel patient on the importance of medication adherence. Note that per Epic and patient, she last picked up a 30 day supply of this medication on 10/22/17. Also counsel patient about the cost savings through her insurance of getting a 90 day supply rather than a 30 day supply of her medications. Patient verbalizes understanding and states that her PCP has already started doing this for her.  [name] denies any medication questions/concerns at this time. Declines to speak further  Will close pharmacy episode.  Harlow Asa, PharmD, [facility] [phone]

## 2017-12-16 ENCOUNTER — Ambulatory Visit: Payer: PPO | Admitting: Occupational Therapy

## 2017-12-16 DIAGNOSIS — M25641 Stiffness of right hand, not elsewhere classified: Secondary | ICD-10-CM | POA: Diagnosis not present

## 2017-12-16 DIAGNOSIS — M25642 Stiffness of left hand, not elsewhere classified: Secondary | ICD-10-CM

## 2017-12-16 DIAGNOSIS — G5622 Lesion of ulnar nerve, left upper limb: Secondary | ICD-10-CM

## 2017-12-16 NOTE — Therapy (Signed)
[facility] [address] [place], [address] Phone: [phone]   Fax:  [phone]  Occupational Therapy Treatment  Patient Details  Name: [name] MRN: [id number] Date of Birth: 1945/07/15 Referring Provider: Jefm Bryant   Encounter Date: 12/16/2017  OT End of Session - 12/16/17 1257    Visit Number  3    Number of Visits  7    Date for OT Re-Evaluation  12/26/17    OT Start Time  6314    OT Stop Time  1310    OT Time Calculation (min)  35 min    Activity Tolerance  Patient tolerated treatment well    Behavior During Therapy  [facility] for tasks assessed/performed       Past Medical History:  Diagnosis Date  . Depression   . Hyperlipidemia   . Muscle pain   . Neuropathy   . RA (rheumatoid arthritis) ([place])   . Thyroid disease   . Vitamin B 12 deficiency     Past Surgical History:  Procedure Laterality Date  . ABDOMINAL HYSTERECTOMY    . BREAST BIOPSY Left    2 benign biopsies  . ECTOPIC PREGNANCY SURGERY    . KNEE SURGERY Right   . LAMINECTOMY    . leg vein stripping Bilateral   . TONSILLECTOMY      There were no vitals filed for this visit.  Subjective Assessment - 12/16/17 1254    Subjective   I see Dr Jefm Bryant next week - I am doing the splint for night time - the numbness in my pinkie is better     Patient Stated Goals  Want to get my fingers on the R open so I can grasp the steering wheel, let go of objects , and prevent my hands to get worse - numbness better on the L pinkie     Currently in Pain?  No/denies       Assess R hand night time  hand base digits extention splint for 3rd thru 5th to decrease flexor contracture - until pt decide if want to have surgery done  fit well and pt wearing it with no issues   L arm -cont with   elbow pad to decrease pressure on cubital tunnel and prevent flexion more than 90 degrees - to cont with - pt report numbness  better   but pt still  positive Tinel and tendernes  Oval 8 on 4th PIP to decrease [place] - size 5 - fits well and working well  Order Oval 8 size 4 for 5th digit - did not come in yet - manager checking again  to wear during day time   Ulnar N glide for L arm 5 reps  2-3 x day  Skin check done and pt ed on what to expect with ionto - to keep patch on for hour this date             OT Treatments/Exercises (OP) - 12/16/17 0001      Iontophoresis   Type of Iontophoresis  Dexamethasone    Location  L cubital tunnel    Dose  med patch , 2.0 current    Time  72             OT Education - 12/16/17 1256    Education Details  ionto use and what to expect     Person(s) Educated  Patient    Methods  Explanation;Demonstration  Comprehension  Returned demonstration       OT Short Term Goals - 11/21/17 2229      OT SHORT TERM GOAL #1   Title  Pt to be independent in use of hand base extention splint R hand , oval 8 on L and elbow pad for ulnar N compression at cubital tunnel    Baseline  no knowledge     Time  2    Period  Weeks    Status  New    Target Date  12/05/17        OT Long Term Goals - 11/21/17 2229      OT LONG TERM GOAL #1   Title  Pt to report decrease numbness in L hand 5th digit and no clicking with use of oval 8 on 4th     Baseline  constant numbness in L 5th , clicking of 4th with fisting and 5th     Time  4    Period  Weeks    Status  New    Target Date  12/19/17            Plan - 12/16/17 1423    Clinical Impression Statement  Pt present with subluxation of R hand 3rd thru 5th extensor tendons at [facility] - pt to cont to wear night time extention splint to prevent flexor contracture until she decide if she going to do surgery - has appt with Dr Jefm Bryant next week again- pt report  locking on R 4th improve - still waiting for oval 8 for 5th - and then numbness in L 5th digit improving     Occupational performance deficits (Please refer to evaluation for  details):  ADL's;IADL's;Leisure    Rehab Potential  Fair    Current Impairments/barriers affecting progress:  subluxation of extensor tendons on R hand - need surgery in future     OT Frequency  1x / week    OT Duration  2 weeks    OT Treatment/Interventions  Therapeutic exercise;Patient/family education;Splinting;Iontophoresis    Plan  Assess progress with ulnar N glide , splint use on R hand , L 4th and elbow pad for Ulnar N     Clinical Decision Making  Several treatment options, min-mod task modification necessary    OT Home Exercise Plan  see pt instruction    Consulted and Agree with Plan of Care  Patient       Patient will benefit from skilled therapeutic intervention in order to improve the following deficits and impairments:  Impaired flexibility, Decreased coordination, Impaired UE functional use, Decreased range of motion  Visit Diagnosis: Stiffness of right hand, not elsewhere classified  Stiffness of left hand, not elsewhere classified  Cubital tunnel syndrome on left    Problem List Patient Active Problem List   Diagnosis Date Noted  . Acute CVA (cerebrovascular accident) ([place]) 07/17/2016  . Rheumatoid arthritis with rheumatoid factor ([place]) 09/20/2013    Rosalyn Gess OTR/L,CLT 12/16/2017, 2:27 PM  [facility] [address], [place], [address] Phone: [phone]   Fax:  [phone]  Name: [name] MRN: [id number] Date of Birth: 1945-05-30

## 2017-12-25 ENCOUNTER — Encounter: Payer: PPO | Admitting: Occupational Therapy

## 2017-12-25 DIAGNOSIS — Z79899 Other long term (current) drug therapy: Secondary | ICD-10-CM | POA: Diagnosis not present

## 2017-12-25 DIAGNOSIS — M79641 Pain in right hand: Secondary | ICD-10-CM | POA: Diagnosis not present

## 2017-12-25 DIAGNOSIS — M79642 Pain in left hand: Secondary | ICD-10-CM | POA: Diagnosis not present

## 2017-12-25 DIAGNOSIS — M0579 Rheumatoid arthritis with rheumatoid factor of multiple sites without organ or systems involvement: Secondary | ICD-10-CM | POA: Diagnosis not present

## 2017-12-26 ENCOUNTER — Ambulatory Visit: Payer: PPO | Admitting: Occupational Therapy

## 2017-12-26 DIAGNOSIS — M25641 Stiffness of right hand, not elsewhere classified: Secondary | ICD-10-CM

## 2017-12-26 DIAGNOSIS — M25642 Stiffness of left hand, not elsewhere classified: Secondary | ICD-10-CM

## 2017-12-26 DIAGNOSIS — G5622 Lesion of ulnar nerve, left upper limb: Secondary | ICD-10-CM

## 2017-12-26 NOTE — Patient Instructions (Signed)
Cont with Ulnar N glide on L  Modify positioning of L elbow  And wear elbow pad

## 2017-12-26 NOTE — Therapy (Signed)
[facility] [address] [place], [address] Phone: [phone]   Fax:  [phone]  Occupational Therapy Treatment  Patient Details  Name: [name] MRN: [id number] Date of Birth: 03/08/1946 No data recorded  Encounter Date: 12/26/2017  OT End of Session - 12/26/17 1421    Visit Number  4    Number of Visits  7    Date for OT Re-Evaluation  12/26/17    OT Start Time  1312    OT Stop Time  1357    OT Time Calculation (min)  45 min    Activity Tolerance  Patient tolerated treatment well    Behavior During Therapy  [facility] for tasks assessed/performed       Past Medical History:  Diagnosis Date  . Depression   . Hyperlipidemia   . Muscle pain   . Neuropathy   . RA (rheumatoid arthritis) ([place])   . Thyroid disease   . Vitamin B 12 deficiency     Past Surgical History:  Procedure Laterality Date  . ABDOMINAL HYSTERECTOMY    . BREAST BIOPSY Left    2 benign biopsies  . ECTOPIC PREGNANCY SURGERY    . KNEE SURGERY Right   . LAMINECTOMY    . leg vein stripping Bilateral   . TONSILLECTOMY      There were no vitals filed for this visit.  Subjective Assessment - 12/26/17 1411    Subjective   I seen Dr Jefm Bryant and he said that he wants me to hold off on any surgeries -because of my history of CVA- doing okay -the numbness in pinkie better- just now in the tip of my pinkie     Patient Stated Goals  Want to get my fingers on the R open so I can grasp the steering wheel, let go of objects , and prevent my hands to get worse - numbness better on the L pinkie     Currently in Pain?  No/denies        Pt numbness only in L DIP - use to be on ulnar side of forearm and hand to 5th - and constant Tinel still positive but less tenderness Ulnar N glide - review - needed min A - 5 reps  To cont at home with  Assess use of oval 8 splint on 4th-  And fitted with size 4 oval 8 on 5th PIP - to decrease  [place] -decrease clicking with composite fist-     Measure for anti ulnar deviation splint for R hand to be use during day           OT Treatments/Exercises (OP) - 12/26/17 0001      Iontophoresis   Type of Iontophoresis  Dexamethasone    Location  L cubital tunnel    Dose  med patch , 2.0 current    Time  19      skin check done prior and pt to keep patch on for about hour afterwards  Tolerate well        OT Education - 12/26/17 1420    Education Details  ionto use and oval 8 splint use - and order anti ulnar deviation splint     Person(s) Educated  Patient    Methods  Explanation;Demonstration    Comprehension  Returned demonstration       OT Short Term Goals - 12/26/17 1427      OT SHORT TERM GOAL #1  Title  Pt to be independent in use of hand base extention splint R hand , oval 8 on L and elbow pad for ulnar N compression at cubital tunnel    Status  Achieved        OT Long Term Goals - 12/26/17 1427      OT LONG TERM GOAL #1   Title  Pt to report decrease numbness in L hand 5th digit and no clicking with use of oval 8 on 4th     Baseline  numbness only now in DIP of  L5th - no cliicking in 4th and 5th during fisting with oval 8 use     Time  4    Status  On-going    Target Date  01/23/18            Plan - 12/26/17 1422    Clinical Impression Statement  Pt present with subluxation of R hand 3rd thru 5th extensor tendons at [facility] - pt to cont wearing extention hand splint on R  hand at night time to prevent flexor contracture -and order this date anti ulnar deviation splint for daytime use - pt report  locking on R 4th improve - this date fitted with oval 8 for 5th -less clicking during fisting - and numbness  only now in tip of 5th digit L - still positive Tinel at Cubital tunnel     Occupational performance deficits (Please refer to evaluation for details):  ADL's;IADL's;Leisure    Rehab Potential  Fair    Current Impairments/barriers affecting  progress:  subluxation of extensor tendons on R hand - need surgery in future     OT Frequency  Biweekly    OT Duration  2 weeks    OT Treatment/Interventions  Therapeutic exercise;Patient/family education;Splinting;Iontophoresis    Plan  fit anti ulnar deviation splint  daytime use on R hand ,  check on use of L 4th , 5th  oval 8 and elbow pad for Ulnar N     Clinical Decision Making  Several treatment options, min-mod task modification necessary    OT Home Exercise Plan  see pt instruction    Consulted and Agree with Plan of Care  Patient       Patient will benefit from skilled therapeutic intervention in order to improve the following deficits and impairments:  Impaired flexibility, Decreased coordination, Impaired UE functional use, Decreased range of motion  Visit Diagnosis: Stiffness of right hand, not elsewhere classified  Stiffness of left hand, not elsewhere classified  Cubital tunnel syndrome on left    Problem List Patient Active Problem List   Diagnosis Date Noted  . Acute CVA (cerebrovascular accident) ([place]) 07/17/2016  . Rheumatoid arthritis with rheumatoid factor ([place]) 09/20/2013    Rosalyn Gess OTR/L,CLT 12/26/2017, 2:31 PM  [facility] [address], [place], [address] Phone: [phone]   Fax:  [phone]  Name: [name] MRN: [id number] Date of Birth: February 01, 1946

## 2018-01-09 ENCOUNTER — Ambulatory Visit: Payer: PPO | Attending: Rheumatology | Admitting: Occupational Therapy

## 2018-01-09 DIAGNOSIS — M25642 Stiffness of left hand, not elsewhere classified: Secondary | ICD-10-CM

## 2018-01-09 DIAGNOSIS — G5622 Lesion of ulnar nerve, left upper limb: Secondary | ICD-10-CM | POA: Diagnosis not present

## 2018-01-09 DIAGNOSIS — M25641 Stiffness of right hand, not elsewhere classified: Secondary | ICD-10-CM

## 2018-01-09 NOTE — Patient Instructions (Signed)
Pt fitted and ed on using anti ulnar deviation splint to use during day - 30 min at time several times and then increase to 1 hour and then 2 hrs - few times during day  Cont with oval 8 on L 4th and 5th  And extention paddle splint for R hand night time   AROM for RD of digits

## 2018-01-09 NOTE — Therapy (Signed)
[facility] [address], [place], [address] Phone: [phone]   Fax:  [phone]  Occupational Therapy Treatment  Patient Details  Name: [name] MRN: [id number] Date of Birth: 1945-10-21 No data recorded  Encounter Date: 01/09/2018  OT End of Session - 01/09/18 1427    Visit Number  5    Number of Visits  7    Date for OT Re-Evaluation  02/06/18    OT Start Time  1115    OT Stop Time  1150    OT Time Calculation (min)  35 min    Activity Tolerance  Patient tolerated treatment well    Behavior During Therapy  [facility] for tasks assessed/performed       Past Medical History:  Diagnosis Date  . Depression   . Hyperlipidemia   . Muscle pain   . Neuropathy   . RA (rheumatoid arthritis) ([place])   . Thyroid disease   . Vitamin B 12 deficiency     Past Surgical History:  Procedure Laterality Date  . ABDOMINAL HYSTERECTOMY    . BREAST BIOPSY Left    2 benign biopsies  . ECTOPIC PREGNANCY SURGERY    . KNEE SURGERY Right   . LAMINECTOMY    . leg vein stripping Bilateral   . TONSILLECTOMY      There were no vitals filed for this visit.  Subjective Assessment - 01/09/18 1422    Subjective   I have been using the oval 8 splints on my L ring and pinkie fingers and then night time one on my R hand - did my other one come  in     Patient Stated Goals  Want to get my fingers on the R open so I can grasp the steering wheel, let go of objects , and prevent my hands to get worse - numbness better on the L pinkie     Currently in Pain?  No/denies         [facility] OT Assessment - 01/09/18 0001      Strength   Right Hand Grip (lbs)  31    Right Hand Lateral Pinch  6 lbs    Right Hand 3 Point Pinch  7 lbs    Left Hand Grip (lbs)  40    Left Hand Lateral Pinch  10 lbs    Left Hand 3 Point Pinch  12 lbs       Grip and prehension assess this date- [facility] - pt more harder time using hand because of R  digits 3 thru 5th not want to extend - poking her in face with fixing hair, doing make up and washing face  and pt had stroke that affected the R side too     Ulnar N glide to cont with on L   Assess use of oval 8 splint on 4th-  And 5th- less locking per pt and to decrease [place] -decrease clicking with composite fist-      Pt fitted and ed on using anti ulnar deviation splint on R hand to use during day - 30 min at time several times and then increase to 1 hour and then 2 hrs - few times during day   Pt donn and doff it about 3 x - needed SBA   Cont with oval 8 on L 4th and 5th  And extention paddle splint for R hand night time   AROM for RD of digits  OT Education - 01/09/18 1427    Education Details  donning and wearing of anti ulnar deviation splint R hand     Person(s) Educated  Patient    Methods  Explanation;Demonstration    Comprehension  Verbalized understanding;Returned demonstration       OT Short Term Goals - 12/26/17 1427      OT SHORT TERM GOAL #1   Title  Pt to be independent in use of hand base extention splint R hand , oval 8 on L and elbow pad for ulnar N compression at cubital tunnel    Status  Achieved        OT Long Term Goals - 12/26/17 1427      OT LONG TERM GOAL #1   Title  Pt to report decrease numbness in L hand 5th digit and no clicking with use of oval 8 on 4th     Baseline  numbness only now in DIP of  L5th - no cliicking in 4th and 5th during fisting with oval 8 use     Time  4    Status  On-going    Target Date  01/23/18            Plan - 01/09/18 1428    Clinical Impression Statement  Pt present with subluxation of R hand 3rd thru 5th extensor tendons at [facility] - pt to cont wearing extention hand splint on R  hand at night time to prevent flexor contracture -fitted this date  anti ulnar deviation splint for daytime use on R hand - to increase wearing gradually  - pt report  locking on R 4th  and 5th digits  improved - pt to wear splints and do HEP for ulnar N glide on L and RD AROM for digits - for month and reassess     Occupational performance deficits (Please refer to evaluation for details):  ADL's;IADL's;Leisure    Rehab Potential  Fair    Current Impairments/barriers affecting progress:  subluxation of extensor tendons on R hand - need surgery in future     OT Frequency  Monthly    OT Duration  4 weeks    OT Treatment/Interventions  Therapeutic exercise;Patient/family education;Splinting;Iontophoresis    Plan  assess use of splints and maintain ROM and grip /prehension strength     OT Home Exercise Plan  see pt instruction    Consulted and Agree with Plan of Care  Patient       Patient will benefit from skilled therapeutic intervention in order to improve the following deficits and impairments:  Impaired flexibility, Decreased coordination, Impaired UE functional use, Decreased range of motion  Visit Diagnosis: Stiffness of right hand, not elsewhere classified - Plan: Ot plan of care cert/re-cert  Stiffness of left hand, not elsewhere classified - Plan: Ot plan of care cert/re-cert  Cubital tunnel syndrome on left - Plan: Ot plan of care cert/re-cert    Problem List Patient Active Problem List   Diagnosis Date Noted  . Acute CVA (cerebrovascular accident) ([place]) 07/17/2016  . Rheumatoid arthritis with rheumatoid factor ([place]) 09/20/2013    Rosalyn Gess OTR/L,CLT 01/09/2018, 2:33 PM  [facility] [address], [place], [address] Phone: [phone]   Fax:  [phone]  Name: [name] MRN: [id number] Date of Birth: 10/14/1945

## 2018-02-03 DIAGNOSIS — F5105 Insomnia due to other mental disorder: Secondary | ICD-10-CM | POA: Diagnosis not present

## 2018-02-03 DIAGNOSIS — F411 Generalized anxiety disorder: Secondary | ICD-10-CM | POA: Diagnosis not present

## 2018-02-03 DIAGNOSIS — F331 Major depressive disorder, recurrent, moderate: Secondary | ICD-10-CM | POA: Diagnosis not present

## 2018-02-24 DIAGNOSIS — M0579 Rheumatoid arthritis with rheumatoid factor of multiple sites without organ or systems involvement: Secondary | ICD-10-CM | POA: Diagnosis not present

## 2018-02-24 DIAGNOSIS — M79641 Pain in right hand: Secondary | ICD-10-CM | POA: Diagnosis not present

## 2018-02-24 DIAGNOSIS — Z79899 Other long term (current) drug therapy: Secondary | ICD-10-CM | POA: Diagnosis not present

## 2018-02-24 DIAGNOSIS — M79642 Pain in left hand: Secondary | ICD-10-CM | POA: Diagnosis not present

## 2018-03-12 DIAGNOSIS — I739 Peripheral vascular disease, unspecified: Secondary | ICD-10-CM | POA: Diagnosis not present

## 2018-03-12 DIAGNOSIS — E034 Atrophy of thyroid (acquired): Secondary | ICD-10-CM | POA: Diagnosis not present

## 2018-03-12 DIAGNOSIS — G4733 Obstructive sleep apnea (adult) (pediatric): Secondary | ICD-10-CM | POA: Diagnosis not present

## 2018-03-12 DIAGNOSIS — E7849 Other hyperlipidemia: Secondary | ICD-10-CM | POA: Diagnosis not present

## 2018-03-12 DIAGNOSIS — E559 Vitamin D deficiency, unspecified: Secondary | ICD-10-CM | POA: Diagnosis not present

## 2018-03-17 DIAGNOSIS — I739 Peripheral vascular disease, unspecified: Secondary | ICD-10-CM | POA: Diagnosis not present

## 2018-03-17 DIAGNOSIS — Z1211 Encounter for screening for malignant neoplasm of colon: Secondary | ICD-10-CM | POA: Diagnosis not present

## 2018-03-17 DIAGNOSIS — Z1212 Encounter for screening for malignant neoplasm of rectum: Secondary | ICD-10-CM | POA: Diagnosis not present

## 2018-03-17 DIAGNOSIS — R7309 Other abnormal glucose: Secondary | ICD-10-CM | POA: Diagnosis not present

## 2018-03-17 DIAGNOSIS — Z79899 Other long term (current) drug therapy: Secondary | ICD-10-CM | POA: Diagnosis not present

## 2018-03-17 DIAGNOSIS — E034 Atrophy of thyroid (acquired): Secondary | ICD-10-CM | POA: Diagnosis not present

## 2018-03-17 DIAGNOSIS — M0579 Rheumatoid arthritis with rheumatoid factor of multiple sites without organ or systems involvement: Secondary | ICD-10-CM | POA: Diagnosis not present

## 2018-03-17 DIAGNOSIS — E7849 Other hyperlipidemia: Secondary | ICD-10-CM | POA: Diagnosis not present

## 2018-03-17 DIAGNOSIS — F3341 Major depressive disorder, recurrent, in partial remission: Secondary | ICD-10-CM | POA: Diagnosis not present

## 2018-03-17 DIAGNOSIS — G4733 Obstructive sleep apnea (adult) (pediatric): Secondary | ICD-10-CM | POA: Diagnosis not present

## 2018-04-19 IMAGING — MG MM DIGITAL SCREENING BILAT W/ CAD
4 series · 4 of 4 positions shown · non-contrast
Comparison: Previous exam(s).

CLINICAL DATA: Screening.

EXAM:
DIGITAL SCREENING BILATERAL MAMMOGRAM WITH CAD

[L MLO]
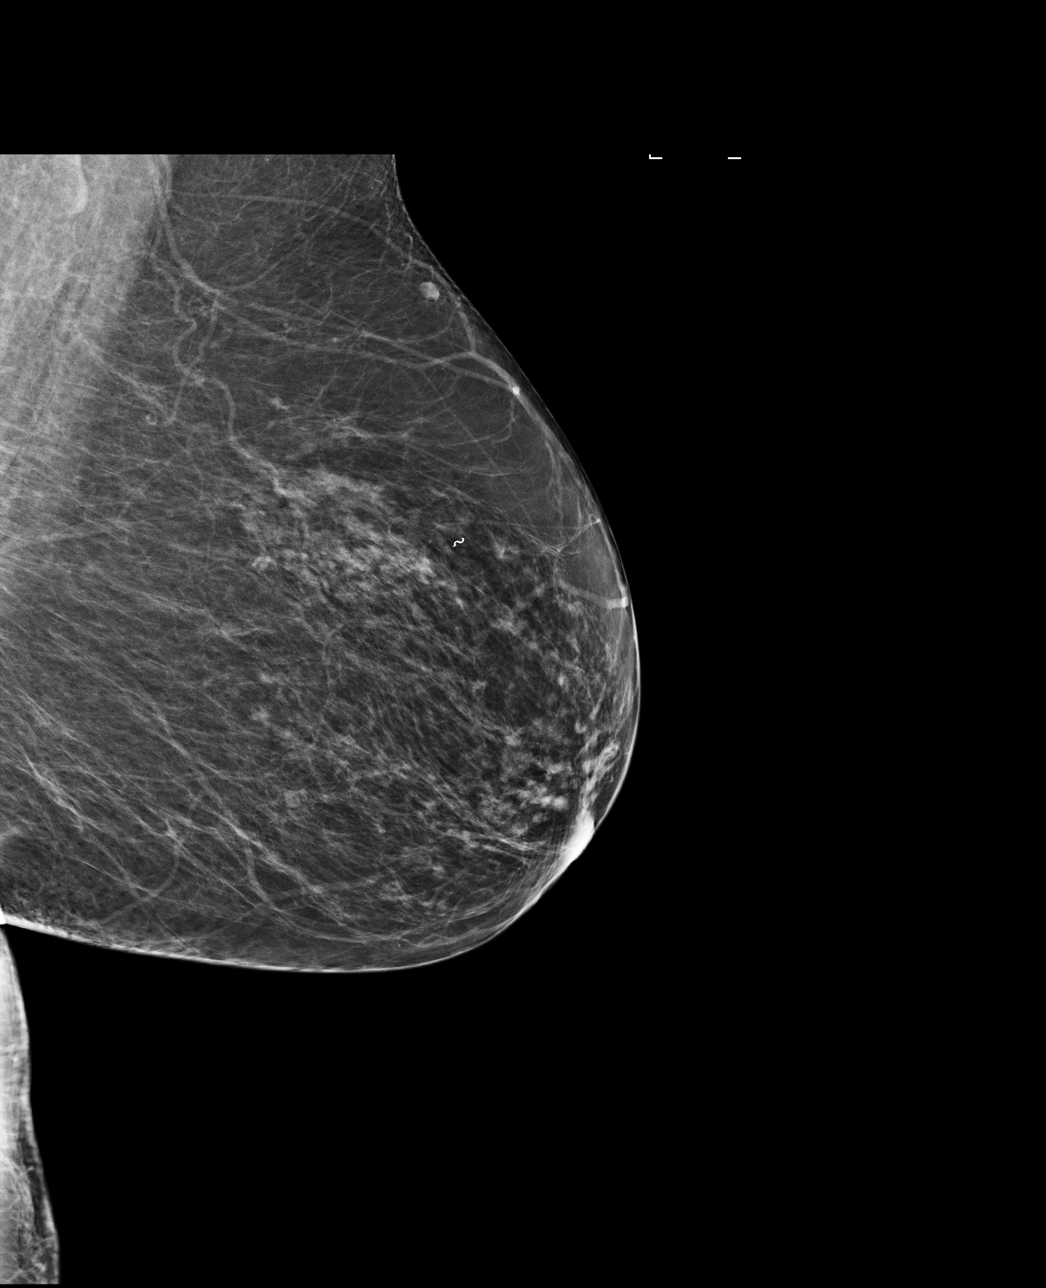

[L CC]
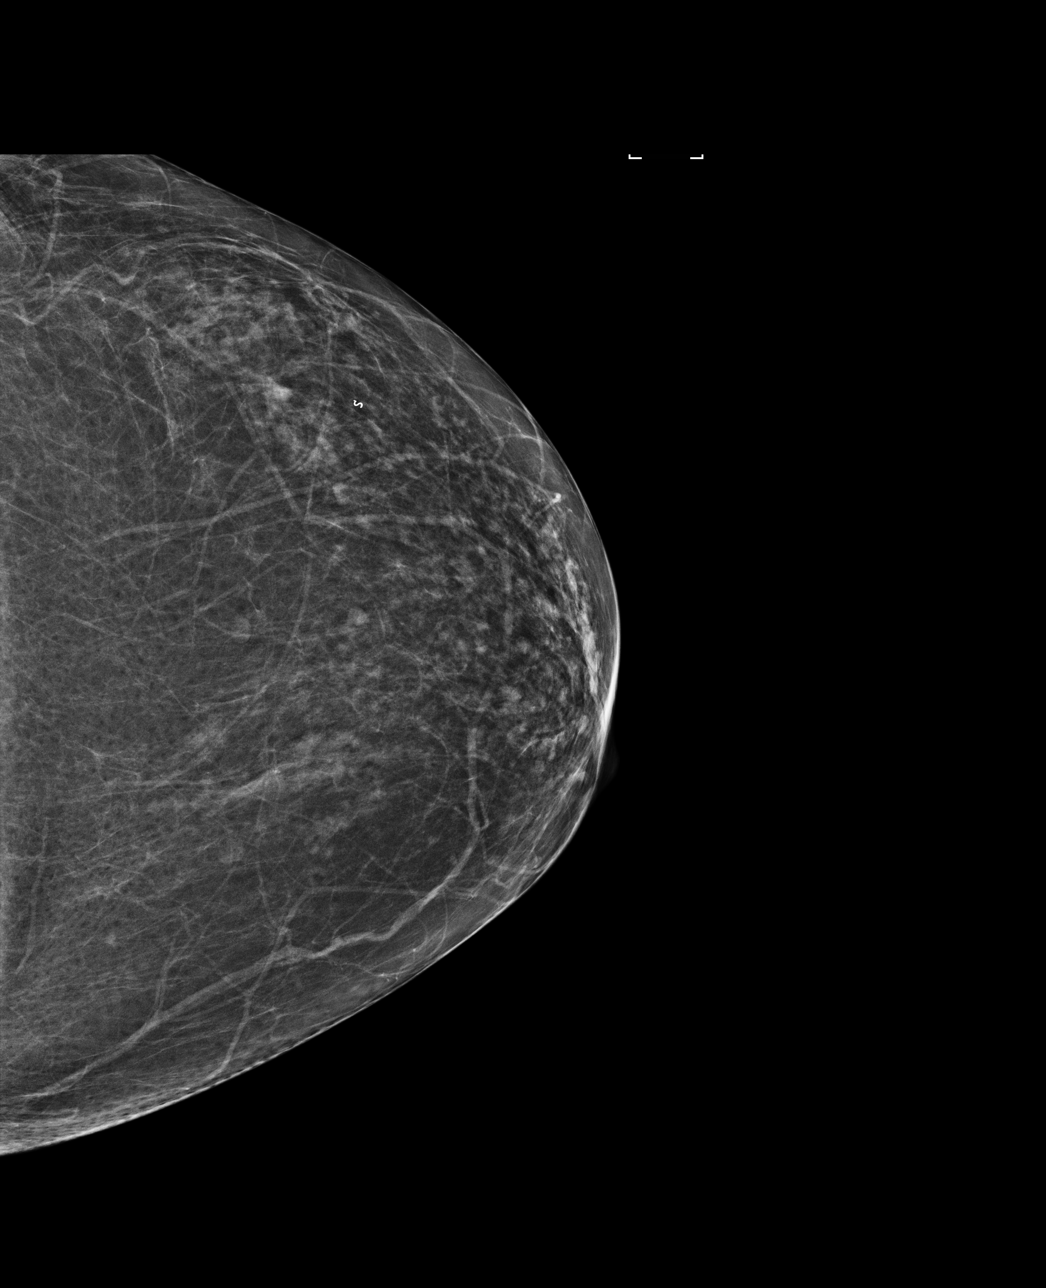

[R CC]
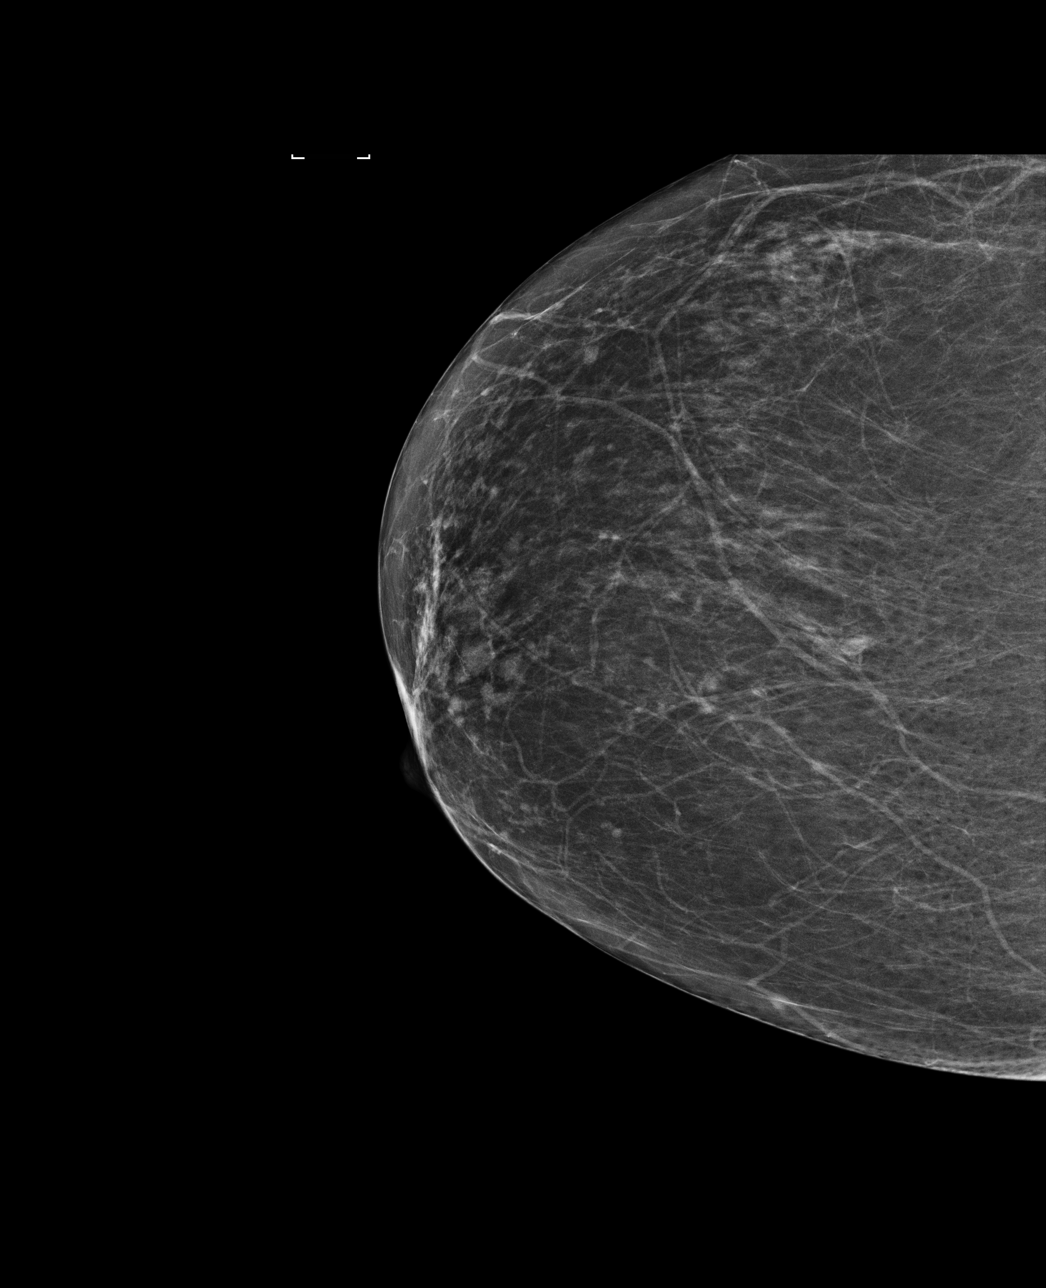

[R MLO]
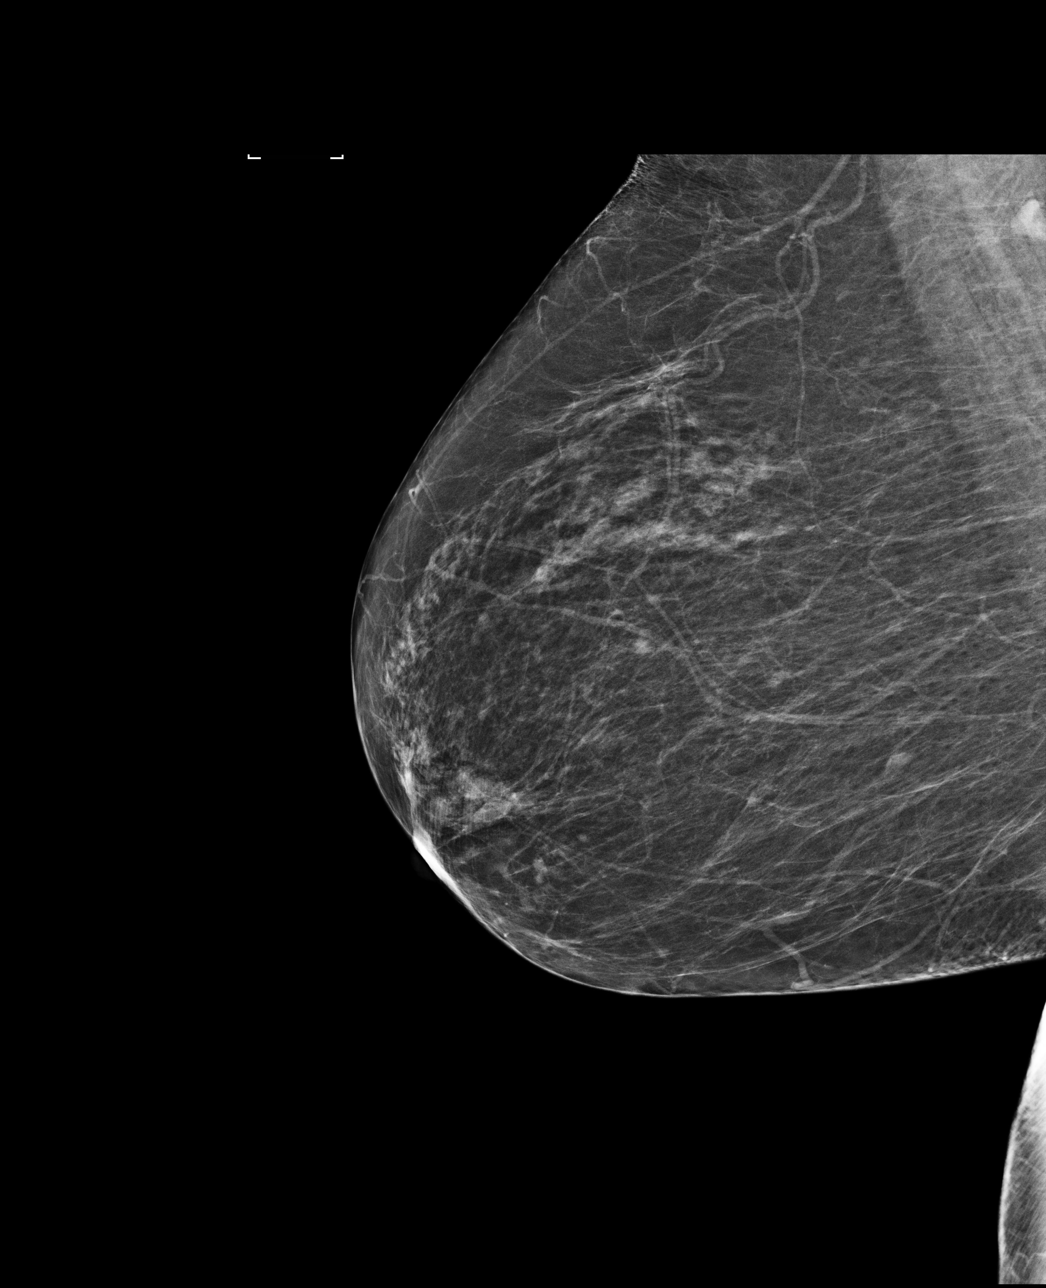

[4 of 4 positions shown; findings below may reference images not displayed]

ACR Breast Density Category b: There are scattered areas of
fibroglandular density.
FINDINGS: There are no findings suspicious for malignancy. Images were
processed with CAD.
IMPRESSION: No mammographic evidence of malignancy. A result letter of this
screening mammogram will be mailed directly to the patient.

RECOMMENDATION:
Screening mammogram in one year. (Code:AS-G-LCT)

BI-RADS CATEGORY  1: Negative.

## 2018-04-20 DIAGNOSIS — Z1212 Encounter for screening for malignant neoplasm of rectum: Secondary | ICD-10-CM | POA: Diagnosis not present

## 2018-04-20 DIAGNOSIS — Z1211 Encounter for screening for malignant neoplasm of colon: Secondary | ICD-10-CM | POA: Diagnosis not present

## 2018-04-28 DIAGNOSIS — M0579 Rheumatoid arthritis with rheumatoid factor of multiple sites without organ or systems involvement: Secondary | ICD-10-CM | POA: Diagnosis not present

## 2018-04-28 DIAGNOSIS — Z79899 Other long term (current) drug therapy: Secondary | ICD-10-CM | POA: Diagnosis not present

## 2018-04-28 DIAGNOSIS — M79641 Pain in right hand: Secondary | ICD-10-CM | POA: Diagnosis not present

## 2018-05-19 DIAGNOSIS — F5105 Insomnia due to other mental disorder: Secondary | ICD-10-CM | POA: Diagnosis not present

## 2018-05-19 DIAGNOSIS — F331 Major depressive disorder, recurrent, moderate: Secondary | ICD-10-CM | POA: Diagnosis not present

## 2018-05-19 DIAGNOSIS — F411 Generalized anxiety disorder: Secondary | ICD-10-CM | POA: Diagnosis not present

## 2018-08-18 DIAGNOSIS — F411 Generalized anxiety disorder: Secondary | ICD-10-CM | POA: Diagnosis not present

## 2018-08-18 DIAGNOSIS — F331 Major depressive disorder, recurrent, moderate: Secondary | ICD-10-CM | POA: Diagnosis not present

## 2018-08-18 DIAGNOSIS — F5105 Insomnia due to other mental disorder: Secondary | ICD-10-CM | POA: Diagnosis not present

## 2018-09-08 ENCOUNTER — Other Ambulatory Visit: Payer: Self-pay | Admitting: Internal Medicine

## 2018-09-08 DIAGNOSIS — Z1231 Encounter for screening mammogram for malignant neoplasm of breast: Secondary | ICD-10-CM

## 2018-09-14 ENCOUNTER — Ambulatory Visit: Payer: PPO

## 2018-09-21 ENCOUNTER — Ambulatory Visit: Payer: Self-pay | Admitting: Podiatry

## 2018-10-06 ENCOUNTER — Other Ambulatory Visit
Admission: RE | Admit: 2018-10-06 | Discharge: 2018-10-06 | Disposition: A | Discharge status: Home / Self Care | Payer: PPO | Source: Ambulatory Visit | Attending: Internal Medicine | Admitting: Internal Medicine

## 2018-10-06 DIAGNOSIS — J069 Acute upper respiratory infection, unspecified: Secondary | ICD-10-CM | POA: Diagnosis not present

## 2018-10-06 DIAGNOSIS — J90 Pleural effusion, not elsewhere classified: Secondary | ICD-10-CM | POA: Diagnosis not present

## 2018-10-06 LAB — BRAIN NATRIURETIC PEPTIDE: B Natriuretic Peptide: 99 pg/mL (ref 0.0–100.0)

## 2018-10-23 ENCOUNTER — Other Ambulatory Visit: Payer: Self-pay | Admitting: Internal Medicine

## 2018-10-23 ENCOUNTER — Other Ambulatory Visit
Admission: RE | Admit: 2018-10-23 | Discharge: 2018-10-23 | Disposition: A | Discharge status: Home / Self Care | Payer: PPO | Source: Ambulatory Visit | Attending: Internal Medicine | Admitting: Internal Medicine

## 2018-10-23 ENCOUNTER — Other Ambulatory Visit: Payer: Self-pay

## 2018-10-23 ENCOUNTER — Ambulatory Visit
Admission: RE | Admit: 2018-10-23 | Discharge: 2018-10-23 | Disposition: A | Discharge status: Home / Self Care | Payer: PPO | Source: Ambulatory Visit | Attending: Internal Medicine | Admitting: Internal Medicine

## 2018-10-23 DIAGNOSIS — I779 Disorder of arteries and arterioles, unspecified: Secondary | ICD-10-CM | POA: Diagnosis not present

## 2018-10-23 DIAGNOSIS — M0579 Rheumatoid arthritis with rheumatoid factor of multiple sites without organ or systems involvement: Secondary | ICD-10-CM | POA: Diagnosis not present

## 2018-10-23 DIAGNOSIS — Z79899 Other long term (current) drug therapy: Secondary | ICD-10-CM | POA: Diagnosis not present

## 2018-10-23 DIAGNOSIS — R0602 Shortness of breath: Secondary | ICD-10-CM

## 2018-10-23 DIAGNOSIS — R7309 Other abnormal glucose: Secondary | ICD-10-CM | POA: Diagnosis not present

## 2018-10-23 DIAGNOSIS — G4733 Obstructive sleep apnea (adult) (pediatric): Secondary | ICD-10-CM | POA: Diagnosis not present

## 2018-10-23 DIAGNOSIS — E7849 Other hyperlipidemia: Secondary | ICD-10-CM | POA: Diagnosis not present

## 2018-10-23 DIAGNOSIS — F3341 Major depressive disorder, recurrent, in partial remission: Secondary | ICD-10-CM | POA: Diagnosis not present

## 2018-10-23 DIAGNOSIS — E034 Atrophy of thyroid (acquired): Secondary | ICD-10-CM | POA: Diagnosis not present

## 2018-10-23 LAB — TROPONIN I (HIGH SENSITIVITY): Troponin I (High Sensitivity): 14 ng/L (ref <18)

## 2018-10-23 LAB — FIBRIN DERIVATIVES D-DIMER (ARMC ONLY): Fibrin derivatives D-dimer (ARMC): 1835.33 ng/mL (FEU) — ABNORMAL HIGH (ref 0.00–499.00)

## 2018-10-27 DIAGNOSIS — M0579 Rheumatoid arthritis with rheumatoid factor of multiple sites without organ or systems involvement: Secondary | ICD-10-CM | POA: Diagnosis not present

## 2018-10-27 DIAGNOSIS — R0600 Dyspnea, unspecified: Secondary | ICD-10-CM | POA: Diagnosis not present

## 2018-10-27 DIAGNOSIS — M79641 Pain in right hand: Secondary | ICD-10-CM | POA: Diagnosis not present

## 2018-11-06 DIAGNOSIS — Z9981 Dependence on supplemental oxygen: Secondary | ICD-10-CM | POA: Diagnosis not present

## 2018-11-06 DIAGNOSIS — R0902 Hypoxemia: Secondary | ICD-10-CM | POA: Diagnosis not present

## 2018-11-06 DIAGNOSIS — R0600 Dyspnea, unspecified: Secondary | ICD-10-CM | POA: Diagnosis not present

## 2018-11-06 DIAGNOSIS — M0579 Rheumatoid arthritis with rheumatoid factor of multiple sites without organ or systems involvement: Secondary | ICD-10-CM | POA: Diagnosis not present

## 2018-11-06 DIAGNOSIS — J9611 Chronic respiratory failure with hypoxia: Secondary | ICD-10-CM | POA: Diagnosis not present

## 2018-11-09 DIAGNOSIS — J45901 Unspecified asthma with (acute) exacerbation: Secondary | ICD-10-CM | POA: Diagnosis not present

## 2018-11-09 DIAGNOSIS — J439 Emphysema, unspecified: Secondary | ICD-10-CM | POA: Diagnosis not present

## 2018-11-26 DIAGNOSIS — M0579 Rheumatoid arthritis with rheumatoid factor of multiple sites without organ or systems involvement: Secondary | ICD-10-CM | POA: Diagnosis not present

## 2018-11-26 DIAGNOSIS — R0600 Dyspnea, unspecified: Secondary | ICD-10-CM | POA: Diagnosis not present

## 2018-11-26 DIAGNOSIS — Z79899 Other long term (current) drug therapy: Secondary | ICD-10-CM | POA: Diagnosis not present

## 2018-11-26 DIAGNOSIS — R700 Elevated erythrocyte sedimentation rate: Secondary | ICD-10-CM | POA: Diagnosis not present

## 2018-12-04 DIAGNOSIS — M0579 Rheumatoid arthritis with rheumatoid factor of multiple sites without organ or systems involvement: Secondary | ICD-10-CM | POA: Diagnosis not present

## 2018-12-04 DIAGNOSIS — R700 Elevated erythrocyte sedimentation rate: Secondary | ICD-10-CM | POA: Diagnosis not present

## 2018-12-10 DIAGNOSIS — J45901 Unspecified asthma with (acute) exacerbation: Secondary | ICD-10-CM | POA: Diagnosis not present

## 2018-12-10 DIAGNOSIS — J439 Emphysema, unspecified: Secondary | ICD-10-CM | POA: Diagnosis not present

## 2018-12-15 DIAGNOSIS — R0600 Dyspnea, unspecified: Secondary | ICD-10-CM | POA: Diagnosis not present

## 2018-12-15 DIAGNOSIS — I779 Disorder of arteries and arterioles, unspecified: Secondary | ICD-10-CM | POA: Diagnosis not present

## 2018-12-15 DIAGNOSIS — R700 Elevated erythrocyte sedimentation rate: Secondary | ICD-10-CM | POA: Diagnosis not present

## 2018-12-15 DIAGNOSIS — E7849 Other hyperlipidemia: Secondary | ICD-10-CM | POA: Diagnosis not present

## 2018-12-15 DIAGNOSIS — R7309 Other abnormal glucose: Secondary | ICD-10-CM | POA: Diagnosis not present

## 2018-12-15 DIAGNOSIS — M0579 Rheumatoid arthritis with rheumatoid factor of multiple sites without organ or systems involvement: Secondary | ICD-10-CM | POA: Diagnosis not present

## 2018-12-15 DIAGNOSIS — E034 Atrophy of thyroid (acquired): Secondary | ICD-10-CM | POA: Diagnosis not present

## 2018-12-15 DIAGNOSIS — G4733 Obstructive sleep apnea (adult) (pediatric): Secondary | ICD-10-CM | POA: Diagnosis not present

## 2018-12-15 DIAGNOSIS — Z79899 Other long term (current) drug therapy: Secondary | ICD-10-CM | POA: Diagnosis not present

## 2018-12-16 DIAGNOSIS — F331 Major depressive disorder, recurrent, moderate: Secondary | ICD-10-CM | POA: Diagnosis not present

## 2018-12-16 DIAGNOSIS — F5105 Insomnia due to other mental disorder: Secondary | ICD-10-CM | POA: Diagnosis not present

## 2018-12-16 DIAGNOSIS — F411 Generalized anxiety disorder: Secondary | ICD-10-CM | POA: Diagnosis not present

## 2018-12-18 DIAGNOSIS — J471 Bronchiectasis with (acute) exacerbation: Secondary | ICD-10-CM | POA: Diagnosis not present

## 2018-12-21 DIAGNOSIS — J8410 Pulmonary fibrosis, unspecified: Secondary | ICD-10-CM | POA: Insufficient documentation

## 2018-12-22 DIAGNOSIS — F3341 Major depressive disorder, recurrent, in partial remission: Secondary | ICD-10-CM | POA: Diagnosis not present

## 2018-12-22 DIAGNOSIS — J9611 Chronic respiratory failure with hypoxia: Secondary | ICD-10-CM | POA: Insufficient documentation

## 2018-12-22 DIAGNOSIS — R7309 Other abnormal glucose: Secondary | ICD-10-CM | POA: Diagnosis not present

## 2018-12-22 DIAGNOSIS — Z780 Asymptomatic menopausal state: Secondary | ICD-10-CM | POA: Diagnosis not present

## 2018-12-22 DIAGNOSIS — E034 Atrophy of thyroid (acquired): Secondary | ICD-10-CM | POA: Diagnosis not present

## 2018-12-22 DIAGNOSIS — D849 Immunodeficiency, unspecified: Secondary | ICD-10-CM | POA: Insufficient documentation

## 2018-12-22 DIAGNOSIS — G4733 Obstructive sleep apnea (adult) (pediatric): Secondary | ICD-10-CM | POA: Diagnosis not present

## 2018-12-22 DIAGNOSIS — E7849 Other hyperlipidemia: Secondary | ICD-10-CM | POA: Diagnosis not present

## 2018-12-22 DIAGNOSIS — I779 Disorder of arteries and arterioles, unspecified: Secondary | ICD-10-CM | POA: Diagnosis not present

## 2018-12-22 DIAGNOSIS — Z23 Encounter for immunization: Secondary | ICD-10-CM | POA: Diagnosis not present

## 2018-12-22 DIAGNOSIS — M8949 Other hypertrophic osteoarthropathy, multiple sites: Secondary | ICD-10-CM | POA: Diagnosis not present

## 2018-12-22 DIAGNOSIS — M0579 Rheumatoid arthritis with rheumatoid factor of multiple sites without organ or systems involvement: Secondary | ICD-10-CM | POA: Diagnosis not present

## 2018-12-22 DIAGNOSIS — J8410 Pulmonary fibrosis, unspecified: Secondary | ICD-10-CM | POA: Diagnosis not present

## 2018-12-22 DIAGNOSIS — Z0000 Encounter for general adult medical examination without abnormal findings: Secondary | ICD-10-CM | POA: Diagnosis not present

## 2019-01-09 DIAGNOSIS — J45901 Unspecified asthma with (acute) exacerbation: Secondary | ICD-10-CM | POA: Diagnosis not present

## 2019-01-09 DIAGNOSIS — J439 Emphysema, unspecified: Secondary | ICD-10-CM | POA: Diagnosis not present

## 2019-01-25 DIAGNOSIS — J479 Bronchiectasis, uncomplicated: Secondary | ICD-10-CM | POA: Diagnosis not present

## 2019-02-09 DIAGNOSIS — J45901 Unspecified asthma with (acute) exacerbation: Secondary | ICD-10-CM | POA: Diagnosis not present

## 2019-02-09 DIAGNOSIS — J439 Emphysema, unspecified: Secondary | ICD-10-CM | POA: Diagnosis not present

## 2019-03-01 DIAGNOSIS — M0579 Rheumatoid arthritis with rheumatoid factor of multiple sites without organ or systems involvement: Secondary | ICD-10-CM | POA: Diagnosis not present

## 2019-03-01 DIAGNOSIS — Z79899 Other long term (current) drug therapy: Secondary | ICD-10-CM | POA: Diagnosis not present

## 2019-03-04 DIAGNOSIS — R0600 Dyspnea, unspecified: Secondary | ICD-10-CM | POA: Diagnosis not present

## 2019-03-04 DIAGNOSIS — M0579 Rheumatoid arthritis with rheumatoid factor of multiple sites without organ or systems involvement: Secondary | ICD-10-CM | POA: Diagnosis not present

## 2019-03-04 DIAGNOSIS — R700 Elevated erythrocyte sedimentation rate: Secondary | ICD-10-CM | POA: Diagnosis not present

## 2019-03-11 DIAGNOSIS — J45901 Unspecified asthma with (acute) exacerbation: Secondary | ICD-10-CM | POA: Diagnosis not present

## 2019-03-11 DIAGNOSIS — J439 Emphysema, unspecified: Secondary | ICD-10-CM | POA: Diagnosis not present

## 2019-04-11 DIAGNOSIS — J439 Emphysema, unspecified: Secondary | ICD-10-CM | POA: Diagnosis not present

## 2019-04-11 DIAGNOSIS — J45901 Unspecified asthma with (acute) exacerbation: Secondary | ICD-10-CM | POA: Diagnosis not present

## 2019-04-12 DIAGNOSIS — F331 Major depressive disorder, recurrent, moderate: Secondary | ICD-10-CM | POA: Diagnosis not present

## 2019-04-12 DIAGNOSIS — F411 Generalized anxiety disorder: Secondary | ICD-10-CM | POA: Diagnosis not present

## 2019-04-12 DIAGNOSIS — F5105 Insomnia due to other mental disorder: Secondary | ICD-10-CM | POA: Diagnosis not present

## 2019-04-14 DIAGNOSIS — J479 Bronchiectasis, uncomplicated: Secondary | ICD-10-CM | POA: Diagnosis not present

## 2019-04-14 DIAGNOSIS — M8588 Other specified disorders of bone density and structure, other site: Secondary | ICD-10-CM | POA: Diagnosis not present

## 2019-04-15 DIAGNOSIS — M0579 Rheumatoid arthritis with rheumatoid factor of multiple sites without organ or systems involvement: Secondary | ICD-10-CM | POA: Diagnosis not present

## 2019-04-15 DIAGNOSIS — M545 Low back pain: Secondary | ICD-10-CM | POA: Diagnosis not present

## 2019-04-15 DIAGNOSIS — R0600 Dyspnea, unspecified: Secondary | ICD-10-CM | POA: Diagnosis not present

## 2019-04-15 DIAGNOSIS — Z79899 Other long term (current) drug therapy: Secondary | ICD-10-CM | POA: Diagnosis not present

## 2019-04-15 DIAGNOSIS — R700 Elevated erythrocyte sedimentation rate: Secondary | ICD-10-CM | POA: Diagnosis not present

## 2019-04-15 DIAGNOSIS — M79641 Pain in right hand: Secondary | ICD-10-CM | POA: Diagnosis not present

## 2019-04-16 DIAGNOSIS — M545 Low back pain: Secondary | ICD-10-CM | POA: Diagnosis not present

## 2019-05-12 DIAGNOSIS — J45901 Unspecified asthma with (acute) exacerbation: Secondary | ICD-10-CM | POA: Diagnosis not present

## 2019-05-12 DIAGNOSIS — J439 Emphysema, unspecified: Secondary | ICD-10-CM | POA: Diagnosis not present

## 2019-05-21 DIAGNOSIS — J479 Bronchiectasis, uncomplicated: Secondary | ICD-10-CM | POA: Diagnosis not present

## 2019-06-09 DIAGNOSIS — J439 Emphysema, unspecified: Secondary | ICD-10-CM | POA: Diagnosis not present

## 2019-06-09 DIAGNOSIS — J45901 Unspecified asthma with (acute) exacerbation: Secondary | ICD-10-CM | POA: Diagnosis not present

## 2019-06-18 DIAGNOSIS — R7309 Other abnormal glucose: Secondary | ICD-10-CM | POA: Diagnosis not present

## 2019-06-18 DIAGNOSIS — M0579 Rheumatoid arthritis with rheumatoid factor of multiple sites without organ or systems involvement: Secondary | ICD-10-CM | POA: Diagnosis not present

## 2019-06-18 DIAGNOSIS — I779 Disorder of arteries and arterioles, unspecified: Secondary | ICD-10-CM | POA: Diagnosis not present

## 2019-06-18 DIAGNOSIS — E7849 Other hyperlipidemia: Secondary | ICD-10-CM | POA: Diagnosis not present

## 2019-06-18 DIAGNOSIS — J479 Bronchiectasis, uncomplicated: Secondary | ICD-10-CM | POA: Diagnosis not present

## 2019-06-18 DIAGNOSIS — E034 Atrophy of thyroid (acquired): Secondary | ICD-10-CM | POA: Diagnosis not present

## 2019-06-18 DIAGNOSIS — Z79899 Other long term (current) drug therapy: Secondary | ICD-10-CM | POA: Diagnosis not present

## 2019-07-10 DIAGNOSIS — J439 Emphysema, unspecified: Secondary | ICD-10-CM | POA: Diagnosis not present

## 2019-07-10 DIAGNOSIS — J45901 Unspecified asthma with (acute) exacerbation: Secondary | ICD-10-CM | POA: Diagnosis not present

## 2019-07-19 DIAGNOSIS — J479 Bronchiectasis, uncomplicated: Secondary | ICD-10-CM | POA: Diagnosis not present

## 2019-07-19 DIAGNOSIS — F331 Major depressive disorder, recurrent, moderate: Secondary | ICD-10-CM | POA: Diagnosis not present

## 2019-07-19 DIAGNOSIS — F411 Generalized anxiety disorder: Secondary | ICD-10-CM | POA: Diagnosis not present

## 2019-07-19 DIAGNOSIS — F5105 Insomnia due to other mental disorder: Secondary | ICD-10-CM | POA: Diagnosis not present

## 2019-08-03 DIAGNOSIS — M0579 Rheumatoid arthritis with rheumatoid factor of multiple sites without organ or systems involvement: Secondary | ICD-10-CM | POA: Diagnosis not present

## 2019-08-03 DIAGNOSIS — M25511 Pain in right shoulder: Secondary | ICD-10-CM | POA: Diagnosis not present

## 2019-08-03 DIAGNOSIS — R0600 Dyspnea, unspecified: Secondary | ICD-10-CM | POA: Diagnosis not present

## 2019-08-03 DIAGNOSIS — R700 Elevated erythrocyte sedimentation rate: Secondary | ICD-10-CM | POA: Diagnosis not present

## 2019-08-03 DIAGNOSIS — Z79899 Other long term (current) drug therapy: Secondary | ICD-10-CM | POA: Diagnosis not present

## 2019-08-09 DIAGNOSIS — J45901 Unspecified asthma with (acute) exacerbation: Secondary | ICD-10-CM | POA: Diagnosis not present

## 2019-08-09 DIAGNOSIS — J439 Emphysema, unspecified: Secondary | ICD-10-CM | POA: Diagnosis not present

## 2019-08-10 ENCOUNTER — Encounter (INDEPENDENT_AMBULATORY_CARE_PROVIDER_SITE_OTHER): Payer: Self-pay

## 2019-08-10 ENCOUNTER — Other Ambulatory Visit: Payer: Self-pay

## 2019-08-10 ENCOUNTER — Ambulatory Visit
Admission: RE | Admit: 2019-08-10 | Discharge: 2019-08-10 | Disposition: A | Discharge status: Home / Self Care | Payer: PPO | Source: Ambulatory Visit | Attending: Internal Medicine | Admitting: Internal Medicine

## 2019-08-10 DIAGNOSIS — Z1231 Encounter for screening mammogram for malignant neoplasm of breast: Secondary | ICD-10-CM | POA: Insufficient documentation

## 2019-08-11 DIAGNOSIS — J8410 Pulmonary fibrosis, unspecified: Secondary | ICD-10-CM | POA: Diagnosis not present

## 2019-08-11 DIAGNOSIS — Z01818 Encounter for other preprocedural examination: Secondary | ICD-10-CM | POA: Diagnosis not present

## 2019-08-12 ENCOUNTER — Other Ambulatory Visit: Payer: Self-pay | Admitting: Internal Medicine

## 2019-08-12 DIAGNOSIS — R7309 Other abnormal glucose: Secondary | ICD-10-CM | POA: Diagnosis not present

## 2019-08-12 DIAGNOSIS — E034 Atrophy of thyroid (acquired): Secondary | ICD-10-CM | POA: Diagnosis not present

## 2019-08-12 DIAGNOSIS — M79651 Pain in right thigh: Secondary | ICD-10-CM

## 2019-08-12 DIAGNOSIS — G4733 Obstructive sleep apnea (adult) (pediatric): Secondary | ICD-10-CM | POA: Diagnosis not present

## 2019-08-12 DIAGNOSIS — D849 Immunodeficiency, unspecified: Secondary | ICD-10-CM | POA: Diagnosis not present

## 2019-08-12 DIAGNOSIS — M25451 Effusion, right hip: Secondary | ICD-10-CM | POA: Diagnosis not present

## 2019-08-12 DIAGNOSIS — J9611 Chronic respiratory failure with hypoxia: Secondary | ICD-10-CM | POA: Diagnosis not present

## 2019-08-12 DIAGNOSIS — I779 Disorder of arteries and arterioles, unspecified: Secondary | ICD-10-CM | POA: Diagnosis not present

## 2019-08-12 DIAGNOSIS — J8410 Pulmonary fibrosis, unspecified: Secondary | ICD-10-CM | POA: Diagnosis not present

## 2019-08-12 DIAGNOSIS — M0579 Rheumatoid arthritis with rheumatoid factor of multiple sites without organ or systems involvement: Secondary | ICD-10-CM | POA: Diagnosis not present

## 2019-08-12 DIAGNOSIS — F3341 Major depressive disorder, recurrent, in partial remission: Secondary | ICD-10-CM | POA: Diagnosis not present

## 2019-08-12 DIAGNOSIS — E7849 Other hyperlipidemia: Secondary | ICD-10-CM | POA: Diagnosis not present

## 2019-08-16 ENCOUNTER — Ambulatory Visit
Admission: RE | Admit: 2019-08-16 | Discharge: 2019-08-16 | Disposition: A | Discharge status: Home / Self Care | Payer: PPO | Source: Ambulatory Visit | Attending: Internal Medicine | Admitting: Internal Medicine

## 2019-08-16 ENCOUNTER — Other Ambulatory Visit: Payer: Self-pay

## 2019-08-16 DIAGNOSIS — M25451 Effusion, right hip: Secondary | ICD-10-CM | POA: Insufficient documentation

## 2019-08-16 DIAGNOSIS — M79651 Pain in right thigh: Secondary | ICD-10-CM | POA: Diagnosis not present

## 2019-09-09 DIAGNOSIS — J45901 Unspecified asthma with (acute) exacerbation: Secondary | ICD-10-CM | POA: Diagnosis not present

## 2019-09-09 DIAGNOSIS — J439 Emphysema, unspecified: Secondary | ICD-10-CM | POA: Diagnosis not present

## 2019-10-09 DIAGNOSIS — J45901 Unspecified asthma with (acute) exacerbation: Secondary | ICD-10-CM | POA: Diagnosis not present

## 2019-10-09 DIAGNOSIS — J439 Emphysema, unspecified: Secondary | ICD-10-CM | POA: Diagnosis not present

## 2019-11-04 DIAGNOSIS — Z79899 Other long term (current) drug therapy: Secondary | ICD-10-CM | POA: Diagnosis not present

## 2019-11-04 DIAGNOSIS — M0579 Rheumatoid arthritis with rheumatoid factor of multiple sites without organ or systems involvement: Secondary | ICD-10-CM | POA: Diagnosis not present

## 2019-11-09 DIAGNOSIS — G8929 Other chronic pain: Secondary | ICD-10-CM | POA: Diagnosis not present

## 2019-11-09 DIAGNOSIS — M25511 Pain in right shoulder: Secondary | ICD-10-CM | POA: Diagnosis not present

## 2019-11-09 DIAGNOSIS — J479 Bronchiectasis, uncomplicated: Secondary | ICD-10-CM | POA: Diagnosis not present

## 2019-11-09 DIAGNOSIS — M0579 Rheumatoid arthritis with rheumatoid factor of multiple sites without organ or systems involvement: Secondary | ICD-10-CM | POA: Diagnosis not present

## 2019-11-09 DIAGNOSIS — R0600 Dyspnea, unspecified: Secondary | ICD-10-CM | POA: Diagnosis not present

## 2019-11-09 DIAGNOSIS — R700 Elevated erythrocyte sedimentation rate: Secondary | ICD-10-CM | POA: Diagnosis not present

## 2019-11-16 DIAGNOSIS — F331 Major depressive disorder, recurrent, moderate: Secondary | ICD-10-CM | POA: Diagnosis not present

## 2019-11-16 DIAGNOSIS — F411 Generalized anxiety disorder: Secondary | ICD-10-CM | POA: Diagnosis not present

## 2019-11-16 DIAGNOSIS — F5105 Insomnia due to other mental disorder: Secondary | ICD-10-CM | POA: Diagnosis not present

## 2020-02-05 DIAGNOSIS — R112 Nausea with vomiting, unspecified: Secondary | ICD-10-CM | POA: Diagnosis not present

## 2020-02-05 DIAGNOSIS — S3992XA Unspecified injury of lower back, initial encounter: Secondary | ICD-10-CM | POA: Diagnosis not present

## 2020-02-05 DIAGNOSIS — Z791 Long term (current) use of non-steroidal anti-inflammatories (NSAID): Secondary | ICD-10-CM | POA: Diagnosis not present

## 2020-02-05 DIAGNOSIS — Z7902 Long term (current) use of antithrombotics/antiplatelets: Secondary | ICD-10-CM | POA: Diagnosis not present

## 2020-02-05 DIAGNOSIS — J8410 Pulmonary fibrosis, unspecified: Secondary | ICD-10-CM | POA: Diagnosis not present

## 2020-02-05 DIAGNOSIS — Z66 Do not resuscitate: Secondary | ICD-10-CM | POA: Diagnosis not present

## 2020-02-05 DIAGNOSIS — D84821 Immunodeficiency due to drugs: Secondary | ICD-10-CM | POA: Diagnosis not present

## 2020-02-05 DIAGNOSIS — R9431 Abnormal electrocardiogram [ECG] [EKG]: Secondary | ICD-10-CM | POA: Diagnosis not present

## 2020-02-05 DIAGNOSIS — S6992XA Unspecified injury of left wrist, hand and finger(s), initial encounter: Secondary | ICD-10-CM | POA: Diagnosis not present

## 2020-02-05 DIAGNOSIS — W19XXXA Unspecified fall, initial encounter: Secondary | ICD-10-CM | POA: Diagnosis not present

## 2020-02-05 DIAGNOSIS — S59912A Unspecified injury of left forearm, initial encounter: Secondary | ICD-10-CM | POA: Diagnosis not present

## 2020-02-05 DIAGNOSIS — Z8759 Personal history of other complications of pregnancy, childbirth and the puerperium: Secondary | ICD-10-CM | POA: Diagnosis not present

## 2020-02-05 DIAGNOSIS — J90 Pleural effusion, not elsewhere classified: Secondary | ICD-10-CM | POA: Diagnosis not present

## 2020-02-05 DIAGNOSIS — S199XXA Unspecified injury of neck, initial encounter: Secondary | ICD-10-CM | POA: Diagnosis not present

## 2020-02-05 DIAGNOSIS — W010XXA Fall on same level from slipping, tripping and stumbling without subsequent striking against object, initial encounter: Secondary | ICD-10-CM | POA: Diagnosis not present

## 2020-02-05 DIAGNOSIS — S8992XA Unspecified injury of left lower leg, initial encounter: Secondary | ICD-10-CM | POA: Diagnosis not present

## 2020-02-05 DIAGNOSIS — Z87891 Personal history of nicotine dependence: Secondary | ICD-10-CM | POA: Diagnosis not present

## 2020-02-05 DIAGNOSIS — Z7952 Long term (current) use of systemic steroids: Secondary | ICD-10-CM | POA: Diagnosis not present

## 2020-02-05 DIAGNOSIS — G4733 Obstructive sleep apnea (adult) (pediatric): Secondary | ICD-10-CM | POA: Diagnosis not present

## 2020-02-05 DIAGNOSIS — N390 Urinary tract infection, site not specified: Secondary | ICD-10-CM | POA: Diagnosis not present

## 2020-02-05 DIAGNOSIS — R069 Unspecified abnormalities of breathing: Secondary | ICD-10-CM | POA: Diagnosis not present

## 2020-02-05 DIAGNOSIS — J471 Bronchiectasis with (acute) exacerbation: Secondary | ICD-10-CM | POA: Diagnosis not present

## 2020-02-05 DIAGNOSIS — S3991XA Unspecified injury of abdomen, initial encounter: Secondary | ICD-10-CM | POA: Diagnosis not present

## 2020-02-05 DIAGNOSIS — S299XXA Unspecified injury of thorax, initial encounter: Secondary | ICD-10-CM | POA: Diagnosis not present

## 2020-02-05 DIAGNOSIS — Z20822 Contact with and (suspected) exposure to covid-19: Secondary | ICD-10-CM | POA: Diagnosis not present

## 2020-02-05 DIAGNOSIS — S6991XA Unspecified injury of right wrist, hand and finger(s), initial encounter: Secondary | ICD-10-CM | POA: Diagnosis not present

## 2020-02-05 DIAGNOSIS — I10 Essential (primary) hypertension: Secondary | ICD-10-CM | POA: Diagnosis not present

## 2020-02-05 DIAGNOSIS — E039 Hypothyroidism, unspecified: Secondary | ICD-10-CM | POA: Diagnosis not present

## 2020-02-05 DIAGNOSIS — S3993XA Unspecified injury of pelvis, initial encounter: Secondary | ICD-10-CM | POA: Diagnosis not present

## 2020-02-05 DIAGNOSIS — I2510 Atherosclerotic heart disease of native coronary artery without angina pectoris: Secondary | ICD-10-CM | POA: Diagnosis not present

## 2020-02-05 DIAGNOSIS — S0231XA Fracture of orbital floor, right side, initial encounter for closed fracture: Secondary | ICD-10-CM | POA: Diagnosis not present

## 2020-02-05 DIAGNOSIS — R0602 Shortness of breath: Secondary | ICD-10-CM | POA: Diagnosis not present

## 2020-02-05 DIAGNOSIS — S59902A Unspecified injury of left elbow, initial encounter: Secondary | ICD-10-CM | POA: Diagnosis not present

## 2020-02-05 DIAGNOSIS — R7989 Other specified abnormal findings of blood chemistry: Secondary | ICD-10-CM | POA: Diagnosis not present

## 2020-02-05 DIAGNOSIS — S8991XA Unspecified injury of right lower leg, initial encounter: Secondary | ICD-10-CM | POA: Diagnosis not present

## 2020-02-05 DIAGNOSIS — J439 Emphysema, unspecified: Secondary | ICD-10-CM | POA: Diagnosis not present

## 2020-02-05 DIAGNOSIS — R778 Other specified abnormalities of plasma proteins: Secondary | ICD-10-CM | POA: Diagnosis not present

## 2020-02-05 DIAGNOSIS — R58 Hemorrhage, not elsewhere classified: Secondary | ICD-10-CM | POA: Diagnosis not present

## 2020-02-05 DIAGNOSIS — M542 Cervicalgia: Secondary | ICD-10-CM | POA: Diagnosis not present

## 2020-02-05 DIAGNOSIS — Z5181 Encounter for therapeutic drug level monitoring: Secondary | ICD-10-CM | POA: Diagnosis not present

## 2020-02-05 DIAGNOSIS — S0083XA Contusion of other part of head, initial encounter: Secondary | ICD-10-CM | POA: Diagnosis not present

## 2020-02-05 DIAGNOSIS — R609 Edema, unspecified: Secondary | ICD-10-CM | POA: Diagnosis not present

## 2020-02-05 DIAGNOSIS — M19012 Primary osteoarthritis, left shoulder: Secondary | ICD-10-CM | POA: Diagnosis not present

## 2020-02-05 DIAGNOSIS — E785 Hyperlipidemia, unspecified: Secondary | ICD-10-CM | POA: Diagnosis not present

## 2020-02-05 DIAGNOSIS — R42 Dizziness and giddiness: Secondary | ICD-10-CM | POA: Diagnosis not present

## 2020-02-05 DIAGNOSIS — M069 Rheumatoid arthritis, unspecified: Secondary | ICD-10-CM | POA: Diagnosis not present

## 2020-02-06 DIAGNOSIS — R778 Other specified abnormalities of plasma proteins: Secondary | ICD-10-CM | POA: Diagnosis not present

## 2020-02-06 DIAGNOSIS — M0579 Rheumatoid arthritis with rheumatoid factor of multiple sites without organ or systems involvement: Secondary | ICD-10-CM | POA: Diagnosis not present

## 2020-02-06 DIAGNOSIS — G4733 Obstructive sleep apnea (adult) (pediatric): Secondary | ICD-10-CM | POA: Diagnosis not present

## 2020-02-06 DIAGNOSIS — J8410 Pulmonary fibrosis, unspecified: Secondary | ICD-10-CM | POA: Diagnosis not present

## 2020-02-06 DIAGNOSIS — W19XXXA Unspecified fall, initial encounter: Secondary | ICD-10-CM | POA: Diagnosis not present

## 2020-02-06 DIAGNOSIS — S0231XA Fracture of orbital floor, right side, initial encounter for closed fracture: Secondary | ICD-10-CM | POA: Diagnosis not present

## 2020-02-06 DIAGNOSIS — E7849 Other hyperlipidemia: Secondary | ICD-10-CM | POA: Diagnosis not present

## 2020-02-06 DIAGNOSIS — E034 Atrophy of thyroid (acquired): Secondary | ICD-10-CM | POA: Diagnosis not present

## 2020-02-06 DIAGNOSIS — D849 Immunodeficiency, unspecified: Secondary | ICD-10-CM | POA: Diagnosis not present

## 2020-02-07 DIAGNOSIS — W19XXXA Unspecified fall, initial encounter: Secondary | ICD-10-CM | POA: Diagnosis not present

## 2020-02-07 DIAGNOSIS — E785 Hyperlipidemia, unspecified: Secondary | ICD-10-CM | POA: Diagnosis not present

## 2020-02-07 DIAGNOSIS — S0231XA Fracture of orbital floor, right side, initial encounter for closed fracture: Secondary | ICD-10-CM | POA: Diagnosis not present

## 2020-02-07 DIAGNOSIS — S4992XA Unspecified injury of left shoulder and upper arm, initial encounter: Secondary | ICD-10-CM | POA: Diagnosis not present

## 2020-02-07 DIAGNOSIS — R778 Other specified abnormalities of plasma proteins: Secondary | ICD-10-CM | POA: Diagnosis not present

## 2020-02-08 DIAGNOSIS — S0231XA Fracture of orbital floor, right side, initial encounter for closed fracture: Secondary | ICD-10-CM | POA: Diagnosis not present

## 2020-02-08 DIAGNOSIS — J479 Bronchiectasis, uncomplicated: Secondary | ICD-10-CM | POA: Diagnosis not present

## 2020-02-08 DIAGNOSIS — R778 Other specified abnormalities of plasma proteins: Secondary | ICD-10-CM | POA: Diagnosis not present

## 2020-02-08 DIAGNOSIS — E785 Hyperlipidemia, unspecified: Secondary | ICD-10-CM | POA: Diagnosis not present

## 2020-02-17 DIAGNOSIS — H05231 Hemorrhage of right orbit: Secondary | ICD-10-CM | POA: Diagnosis not present

## 2020-02-17 DIAGNOSIS — S0231XD Fracture of orbital floor, right side, subsequent encounter for fracture with routine healing: Secondary | ICD-10-CM | POA: Diagnosis not present

## 2020-02-17 DIAGNOSIS — W1839XD Other fall on same level, subsequent encounter: Secondary | ICD-10-CM | POA: Diagnosis not present

## 2020-02-18 DIAGNOSIS — J9611 Chronic respiratory failure with hypoxia: Secondary | ICD-10-CM | POA: Diagnosis not present

## 2020-02-18 DIAGNOSIS — Z0000 Encounter for general adult medical examination without abnormal findings: Secondary | ICD-10-CM | POA: Diagnosis not present

## 2020-02-18 DIAGNOSIS — F3341 Major depressive disorder, recurrent, in partial remission: Secondary | ICD-10-CM | POA: Diagnosis not present

## 2020-02-18 DIAGNOSIS — M75102 Unspecified rotator cuff tear or rupture of left shoulder, not specified as traumatic: Secondary | ICD-10-CM | POA: Diagnosis not present

## 2020-02-18 DIAGNOSIS — J8410 Pulmonary fibrosis, unspecified: Secondary | ICD-10-CM | POA: Diagnosis not present

## 2020-02-18 DIAGNOSIS — M0579 Rheumatoid arthritis with rheumatoid factor of multiple sites without organ or systems involvement: Secondary | ICD-10-CM | POA: Diagnosis not present

## 2020-02-18 DIAGNOSIS — Z1211 Encounter for screening for malignant neoplasm of colon: Secondary | ICD-10-CM | POA: Diagnosis not present

## 2020-02-18 DIAGNOSIS — M19012 Primary osteoarthritis, left shoulder: Secondary | ICD-10-CM | POA: Diagnosis not present

## 2020-02-18 DIAGNOSIS — M25512 Pain in left shoulder: Secondary | ICD-10-CM | POA: Diagnosis not present

## 2020-02-18 DIAGNOSIS — D849 Immunodeficiency, unspecified: Secondary | ICD-10-CM | POA: Diagnosis not present

## 2020-02-18 DIAGNOSIS — M19019 Primary osteoarthritis, unspecified shoulder: Secondary | ICD-10-CM | POA: Diagnosis not present

## 2020-02-18 DIAGNOSIS — S0231XD Fracture of orbital floor, right side, subsequent encounter for fracture with routine healing: Secondary | ICD-10-CM | POA: Diagnosis not present

## 2020-02-18 DIAGNOSIS — W19XXXS Unspecified fall, sequela: Secondary | ICD-10-CM | POA: Diagnosis not present

## 2020-02-18 DIAGNOSIS — Z23 Encounter for immunization: Secondary | ICD-10-CM | POA: Diagnosis not present

## 2020-02-18 DIAGNOSIS — I779 Disorder of arteries and arterioles, unspecified: Secondary | ICD-10-CM | POA: Diagnosis not present

## 2020-02-18 DIAGNOSIS — E785 Hyperlipidemia, unspecified: Secondary | ICD-10-CM | POA: Diagnosis not present

## 2020-02-21 DIAGNOSIS — R32 Unspecified urinary incontinence: Secondary | ICD-10-CM | POA: Diagnosis not present

## 2020-02-21 DIAGNOSIS — F3341 Major depressive disorder, recurrent, in partial remission: Secondary | ICD-10-CM | POA: Diagnosis not present

## 2020-02-21 DIAGNOSIS — J9611 Chronic respiratory failure with hypoxia: Secondary | ICD-10-CM | POA: Diagnosis not present

## 2020-02-21 DIAGNOSIS — Z9981 Dependence on supplemental oxygen: Secondary | ICD-10-CM | POA: Diagnosis not present

## 2020-02-21 DIAGNOSIS — E785 Hyperlipidemia, unspecified: Secondary | ICD-10-CM | POA: Diagnosis not present

## 2020-02-21 DIAGNOSIS — Z87891 Personal history of nicotine dependence: Secondary | ICD-10-CM | POA: Diagnosis not present

## 2020-02-21 DIAGNOSIS — J439 Emphysema, unspecified: Secondary | ICD-10-CM | POA: Diagnosis not present

## 2020-02-21 DIAGNOSIS — G4730 Sleep apnea, unspecified: Secondary | ICD-10-CM | POA: Diagnosis not present

## 2020-02-21 DIAGNOSIS — M1990 Unspecified osteoarthritis, unspecified site: Secondary | ICD-10-CM | POA: Diagnosis not present

## 2020-02-21 DIAGNOSIS — J8410 Pulmonary fibrosis, unspecified: Secondary | ICD-10-CM | POA: Diagnosis not present

## 2020-02-21 DIAGNOSIS — M069 Rheumatoid arthritis, unspecified: Secondary | ICD-10-CM | POA: Diagnosis not present

## 2020-02-21 DIAGNOSIS — I10 Essential (primary) hypertension: Secondary | ICD-10-CM | POA: Diagnosis not present

## 2020-02-21 DIAGNOSIS — E039 Hypothyroidism, unspecified: Secondary | ICD-10-CM | POA: Diagnosis not present

## 2020-02-21 DIAGNOSIS — N390 Urinary tract infection, site not specified: Secondary | ICD-10-CM | POA: Diagnosis not present

## 2020-02-21 DIAGNOSIS — Z7952 Long term (current) use of systemic steroids: Secondary | ICD-10-CM | POA: Diagnosis not present

## 2020-02-21 DIAGNOSIS — M5135 Other intervertebral disc degeneration, thoracolumbar region: Secondary | ICD-10-CM | POA: Diagnosis not present

## 2020-02-21 DIAGNOSIS — I6523 Occlusion and stenosis of bilateral carotid arteries: Secondary | ICD-10-CM | POA: Diagnosis not present

## 2020-02-21 DIAGNOSIS — Z7982 Long term (current) use of aspirin: Secondary | ICD-10-CM | POA: Diagnosis not present

## 2020-02-21 DIAGNOSIS — Z7902 Long term (current) use of antithrombotics/antiplatelets: Secondary | ICD-10-CM | POA: Diagnosis not present

## 2020-02-21 DIAGNOSIS — S0231XD Fracture of orbital floor, right side, subsequent encounter for fracture with routine healing: Secondary | ICD-10-CM | POA: Diagnosis not present

## 2020-02-21 DIAGNOSIS — Z9181 History of falling: Secondary | ICD-10-CM | POA: Diagnosis not present

## 2020-02-21 DIAGNOSIS — D649 Anemia, unspecified: Secondary | ICD-10-CM | POA: Diagnosis not present

## 2020-02-22 DIAGNOSIS — Z01818 Encounter for other preprocedural examination: Secondary | ICD-10-CM | POA: Diagnosis not present

## 2020-03-03 DIAGNOSIS — J439 Emphysema, unspecified: Secondary | ICD-10-CM | POA: Diagnosis not present

## 2020-03-03 DIAGNOSIS — I10 Essential (primary) hypertension: Secondary | ICD-10-CM | POA: Diagnosis not present

## 2020-03-03 DIAGNOSIS — M069 Rheumatoid arthritis, unspecified: Secondary | ICD-10-CM | POA: Diagnosis not present

## 2020-03-03 DIAGNOSIS — J9611 Chronic respiratory failure with hypoxia: Secondary | ICD-10-CM | POA: Diagnosis not present

## 2020-03-03 DIAGNOSIS — N390 Urinary tract infection, site not specified: Secondary | ICD-10-CM | POA: Diagnosis not present

## 2020-03-03 DIAGNOSIS — M5135 Other intervertebral disc degeneration, thoracolumbar region: Secondary | ICD-10-CM | POA: Diagnosis not present

## 2020-03-03 DIAGNOSIS — S0231XD Fracture of orbital floor, right side, subsequent encounter for fracture with routine healing: Secondary | ICD-10-CM | POA: Diagnosis not present

## 2020-03-07 DIAGNOSIS — S0231XD Fracture of orbital floor, right side, subsequent encounter for fracture with routine healing: Secondary | ICD-10-CM | POA: Diagnosis not present

## 2020-03-07 DIAGNOSIS — F3341 Major depressive disorder, recurrent, in partial remission: Secondary | ICD-10-CM | POA: Diagnosis not present

## 2020-03-07 DIAGNOSIS — Z7902 Long term (current) use of antithrombotics/antiplatelets: Secondary | ICD-10-CM | POA: Diagnosis not present

## 2020-03-07 DIAGNOSIS — Z87891 Personal history of nicotine dependence: Secondary | ICD-10-CM | POA: Diagnosis not present

## 2020-03-07 DIAGNOSIS — J9611 Chronic respiratory failure with hypoxia: Secondary | ICD-10-CM | POA: Diagnosis not present

## 2020-03-07 DIAGNOSIS — Z9181 History of falling: Secondary | ICD-10-CM | POA: Diagnosis not present

## 2020-03-07 DIAGNOSIS — J439 Emphysema, unspecified: Secondary | ICD-10-CM | POA: Diagnosis not present

## 2020-03-07 DIAGNOSIS — J8410 Pulmonary fibrosis, unspecified: Secondary | ICD-10-CM | POA: Diagnosis not present

## 2020-03-07 DIAGNOSIS — Z7982 Long term (current) use of aspirin: Secondary | ICD-10-CM | POA: Diagnosis not present

## 2020-03-07 DIAGNOSIS — M069 Rheumatoid arthritis, unspecified: Secondary | ICD-10-CM | POA: Diagnosis not present

## 2020-03-07 DIAGNOSIS — Z7952 Long term (current) use of systemic steroids: Secondary | ICD-10-CM | POA: Diagnosis not present

## 2020-03-07 DIAGNOSIS — E785 Hyperlipidemia, unspecified: Secondary | ICD-10-CM | POA: Diagnosis not present

## 2020-03-07 DIAGNOSIS — M1990 Unspecified osteoarthritis, unspecified site: Secondary | ICD-10-CM | POA: Diagnosis not present

## 2020-03-07 DIAGNOSIS — E039 Hypothyroidism, unspecified: Secondary | ICD-10-CM | POA: Diagnosis not present

## 2020-03-07 DIAGNOSIS — Z9981 Dependence on supplemental oxygen: Secondary | ICD-10-CM | POA: Diagnosis not present

## 2020-03-07 DIAGNOSIS — I10 Essential (primary) hypertension: Secondary | ICD-10-CM | POA: Diagnosis not present

## 2020-03-07 DIAGNOSIS — I6523 Occlusion and stenosis of bilateral carotid arteries: Secondary | ICD-10-CM | POA: Diagnosis not present

## 2020-03-07 DIAGNOSIS — N390 Urinary tract infection, site not specified: Secondary | ICD-10-CM | POA: Diagnosis not present

## 2020-03-07 DIAGNOSIS — G4730 Sleep apnea, unspecified: Secondary | ICD-10-CM | POA: Diagnosis not present

## 2020-03-07 DIAGNOSIS — R32 Unspecified urinary incontinence: Secondary | ICD-10-CM | POA: Diagnosis not present

## 2020-03-07 DIAGNOSIS — M5135 Other intervertebral disc degeneration, thoracolumbar region: Secondary | ICD-10-CM | POA: Diagnosis not present

## 2020-03-07 DIAGNOSIS — D649 Anemia, unspecified: Secondary | ICD-10-CM | POA: Diagnosis not present

## 2020-03-10 DIAGNOSIS — J45901 Unspecified asthma with (acute) exacerbation: Secondary | ICD-10-CM | POA: Diagnosis not present

## 2020-03-14 DIAGNOSIS — F5105 Insomnia due to other mental disorder: Secondary | ICD-10-CM | POA: Diagnosis not present

## 2020-03-14 DIAGNOSIS — F331 Major depressive disorder, recurrent, moderate: Secondary | ICD-10-CM | POA: Diagnosis not present

## 2020-03-14 DIAGNOSIS — F411 Generalized anxiety disorder: Secondary | ICD-10-CM | POA: Diagnosis not present

## 2020-03-17 DIAGNOSIS — G4733 Obstructive sleep apnea (adult) (pediatric): Secondary | ICD-10-CM | POA: Diagnosis not present

## 2020-03-17 DIAGNOSIS — R7309 Other abnormal glucose: Secondary | ICD-10-CM | POA: Diagnosis not present

## 2020-03-17 DIAGNOSIS — I779 Disorder of arteries and arterioles, unspecified: Secondary | ICD-10-CM | POA: Diagnosis not present

## 2020-03-17 DIAGNOSIS — J9611 Chronic respiratory failure with hypoxia: Secondary | ICD-10-CM | POA: Diagnosis not present

## 2020-03-17 DIAGNOSIS — D849 Immunodeficiency, unspecified: Secondary | ICD-10-CM | POA: Diagnosis not present

## 2020-03-17 DIAGNOSIS — E7849 Other hyperlipidemia: Secondary | ICD-10-CM | POA: Diagnosis not present

## 2020-03-17 DIAGNOSIS — E034 Atrophy of thyroid (acquired): Secondary | ICD-10-CM | POA: Diagnosis not present

## 2020-03-17 DIAGNOSIS — J8410 Pulmonary fibrosis, unspecified: Secondary | ICD-10-CM | POA: Diagnosis not present

## 2020-03-17 DIAGNOSIS — F3341 Major depressive disorder, recurrent, in partial remission: Secondary | ICD-10-CM | POA: Diagnosis not present

## 2020-03-17 DIAGNOSIS — M0579 Rheumatoid arthritis with rheumatoid factor of multiple sites without organ or systems involvement: Secondary | ICD-10-CM | POA: Diagnosis not present

## 2020-03-29 DIAGNOSIS — Z961 Presence of intraocular lens: Secondary | ICD-10-CM | POA: Diagnosis not present

## 2020-04-04 DIAGNOSIS — J8410 Pulmonary fibrosis, unspecified: Secondary | ICD-10-CM | POA: Diagnosis not present

## 2020-04-04 DIAGNOSIS — I6523 Occlusion and stenosis of bilateral carotid arteries: Secondary | ICD-10-CM | POA: Diagnosis not present

## 2020-04-04 DIAGNOSIS — E039 Hypothyroidism, unspecified: Secondary | ICD-10-CM | POA: Diagnosis not present

## 2020-04-04 DIAGNOSIS — G4730 Sleep apnea, unspecified: Secondary | ICD-10-CM | POA: Diagnosis not present

## 2020-04-04 DIAGNOSIS — D649 Anemia, unspecified: Secondary | ICD-10-CM | POA: Diagnosis not present

## 2020-04-04 DIAGNOSIS — M1990 Unspecified osteoarthritis, unspecified site: Secondary | ICD-10-CM | POA: Diagnosis not present

## 2020-04-04 DIAGNOSIS — R32 Unspecified urinary incontinence: Secondary | ICD-10-CM | POA: Diagnosis not present

## 2020-04-04 DIAGNOSIS — S0231XD Fracture of orbital floor, right side, subsequent encounter for fracture with routine healing: Secondary | ICD-10-CM | POA: Diagnosis not present

## 2020-04-04 DIAGNOSIS — Z9981 Dependence on supplemental oxygen: Secondary | ICD-10-CM | POA: Diagnosis not present

## 2020-04-04 DIAGNOSIS — I10 Essential (primary) hypertension: Secondary | ICD-10-CM | POA: Diagnosis not present

## 2020-04-04 DIAGNOSIS — J439 Emphysema, unspecified: Secondary | ICD-10-CM | POA: Diagnosis not present

## 2020-04-04 DIAGNOSIS — M5135 Other intervertebral disc degeneration, thoracolumbar region: Secondary | ICD-10-CM | POA: Diagnosis not present

## 2020-04-04 DIAGNOSIS — Z7952 Long term (current) use of systemic steroids: Secondary | ICD-10-CM | POA: Diagnosis not present

## 2020-04-04 DIAGNOSIS — Z9181 History of falling: Secondary | ICD-10-CM | POA: Diagnosis not present

## 2020-04-04 DIAGNOSIS — N390 Urinary tract infection, site not specified: Secondary | ICD-10-CM | POA: Diagnosis not present

## 2020-04-04 DIAGNOSIS — E785 Hyperlipidemia, unspecified: Secondary | ICD-10-CM | POA: Diagnosis not present

## 2020-04-04 DIAGNOSIS — M069 Rheumatoid arthritis, unspecified: Secondary | ICD-10-CM | POA: Diagnosis not present

## 2020-04-04 DIAGNOSIS — J9611 Chronic respiratory failure with hypoxia: Secondary | ICD-10-CM | POA: Diagnosis not present

## 2020-04-04 DIAGNOSIS — Z7902 Long term (current) use of antithrombotics/antiplatelets: Secondary | ICD-10-CM | POA: Diagnosis not present

## 2020-04-04 DIAGNOSIS — F3341 Major depressive disorder, recurrent, in partial remission: Secondary | ICD-10-CM | POA: Diagnosis not present

## 2020-04-04 DIAGNOSIS — Z7982 Long term (current) use of aspirin: Secondary | ICD-10-CM | POA: Diagnosis not present

## 2020-04-04 DIAGNOSIS — Z87891 Personal history of nicotine dependence: Secondary | ICD-10-CM | POA: Diagnosis not present

## 2020-04-10 DIAGNOSIS — J45901 Unspecified asthma with (acute) exacerbation: Secondary | ICD-10-CM | POA: Diagnosis not present

## 2020-05-09 DIAGNOSIS — G8929 Other chronic pain: Secondary | ICD-10-CM | POA: Diagnosis not present

## 2020-05-09 DIAGNOSIS — Z79899 Other long term (current) drug therapy: Secondary | ICD-10-CM | POA: Diagnosis not present

## 2020-05-09 DIAGNOSIS — M25561 Pain in right knee: Secondary | ICD-10-CM | POA: Diagnosis not present

## 2020-05-09 DIAGNOSIS — M0579 Rheumatoid arthritis with rheumatoid factor of multiple sites without organ or systems involvement: Secondary | ICD-10-CM | POA: Diagnosis not present

## 2020-05-09 DIAGNOSIS — R0600 Dyspnea, unspecified: Secondary | ICD-10-CM | POA: Diagnosis not present

## 2020-05-11 DIAGNOSIS — H26491 Other secondary cataract, right eye: Secondary | ICD-10-CM | POA: Diagnosis not present

## 2020-05-11 DIAGNOSIS — J45901 Unspecified asthma with (acute) exacerbation: Secondary | ICD-10-CM | POA: Diagnosis not present

## 2020-05-11 DIAGNOSIS — H02831 Dermatochalasis of right upper eyelid: Secondary | ICD-10-CM | POA: Diagnosis not present

## 2020-05-11 DIAGNOSIS — Z961 Presence of intraocular lens: Secondary | ICD-10-CM | POA: Diagnosis not present

## 2020-05-11 DIAGNOSIS — H26493 Other secondary cataract, bilateral: Secondary | ICD-10-CM | POA: Diagnosis not present

## 2020-05-11 DIAGNOSIS — H18413 Arcus senilis, bilateral: Secondary | ICD-10-CM | POA: Diagnosis not present

## 2020-05-18 DIAGNOSIS — H26491 Other secondary cataract, right eye: Secondary | ICD-10-CM | POA: Diagnosis not present

## 2020-05-25 DIAGNOSIS — H26492 Other secondary cataract, left eye: Secondary | ICD-10-CM | POA: Diagnosis not present

## 2020-06-05 DIAGNOSIS — Z01818 Encounter for other preprocedural examination: Secondary | ICD-10-CM | POA: Diagnosis not present

## 2020-06-05 DIAGNOSIS — R0602 Shortness of breath: Secondary | ICD-10-CM | POA: Diagnosis not present

## 2020-06-05 DIAGNOSIS — M0579 Rheumatoid arthritis with rheumatoid factor of multiple sites without organ or systems involvement: Secondary | ICD-10-CM | POA: Diagnosis not present

## 2020-06-05 DIAGNOSIS — Z79899 Other long term (current) drug therapy: Secondary | ICD-10-CM | POA: Diagnosis not present

## 2020-06-05 DIAGNOSIS — E034 Atrophy of thyroid (acquired): Secondary | ICD-10-CM | POA: Diagnosis not present

## 2020-06-05 DIAGNOSIS — E7849 Other hyperlipidemia: Secondary | ICD-10-CM | POA: Diagnosis not present

## 2020-06-05 DIAGNOSIS — R7309 Other abnormal glucose: Secondary | ICD-10-CM | POA: Diagnosis not present

## 2020-06-06 DIAGNOSIS — R7309 Other abnormal glucose: Secondary | ICD-10-CM | POA: Diagnosis not present

## 2020-06-06 DIAGNOSIS — E034 Atrophy of thyroid (acquired): Secondary | ICD-10-CM | POA: Diagnosis not present

## 2020-06-06 DIAGNOSIS — E7849 Other hyperlipidemia: Secondary | ICD-10-CM | POA: Diagnosis not present

## 2020-06-08 DIAGNOSIS — J45901 Unspecified asthma with (acute) exacerbation: Secondary | ICD-10-CM | POA: Diagnosis not present

## 2020-06-09 DIAGNOSIS — R7309 Other abnormal glucose: Secondary | ICD-10-CM | POA: Diagnosis not present

## 2020-06-09 DIAGNOSIS — M0579 Rheumatoid arthritis with rheumatoid factor of multiple sites without organ or systems involvement: Secondary | ICD-10-CM | POA: Diagnosis not present

## 2020-06-09 DIAGNOSIS — E034 Atrophy of thyroid (acquired): Secondary | ICD-10-CM | POA: Diagnosis not present

## 2020-06-09 DIAGNOSIS — E7849 Other hyperlipidemia: Secondary | ICD-10-CM | POA: Diagnosis not present

## 2020-06-09 DIAGNOSIS — Z79899 Other long term (current) drug therapy: Secondary | ICD-10-CM | POA: Diagnosis not present

## 2020-06-12 ENCOUNTER — Other Ambulatory Visit: Payer: Self-pay

## 2020-06-12 ENCOUNTER — Ambulatory Visit: Payer: PPO | Admitting: Podiatry

## 2020-06-12 ENCOUNTER — Encounter: Payer: Self-pay | Admitting: Podiatry

## 2020-06-12 DIAGNOSIS — E785 Hyperlipidemia, unspecified: Secondary | ICD-10-CM | POA: Insufficient documentation

## 2020-06-12 DIAGNOSIS — M7742 Metatarsalgia, left foot: Secondary | ICD-10-CM

## 2020-06-12 DIAGNOSIS — M7741 Metatarsalgia, right foot: Secondary | ICD-10-CM | POA: Diagnosis not present

## 2020-06-12 DIAGNOSIS — E034 Atrophy of thyroid (acquired): Secondary | ICD-10-CM | POA: Insufficient documentation

## 2020-06-12 DIAGNOSIS — M0630 Rheumatoid nodule, unspecified site: Secondary | ICD-10-CM | POA: Insufficient documentation

## 2020-06-12 DIAGNOSIS — F3341 Major depressive disorder, recurrent, in partial remission: Secondary | ICD-10-CM | POA: Insufficient documentation

## 2020-06-12 DIAGNOSIS — M6740 Ganglion, unspecified site: Secondary | ICD-10-CM | POA: Insufficient documentation

## 2020-06-12 DIAGNOSIS — G4730 Sleep apnea, unspecified: Secondary | ICD-10-CM | POA: Insufficient documentation

## 2020-06-12 NOTE — Progress Notes (Signed)
Subjective:  Patient ID: [name], female    DOB: June 26, 1945,  MRN: [id number] HPI Chief Complaint  Patient presents with  . Foot Pain  . Callouses    Patient presents today for bilat feet pain and painful callouses, bunions and hammertoes x years.  She says "I use to have neuromas and I think they are hurting again and my feet are swelling and I cant hardly wear my shoes"    75 y.o. female presents with the above complaint.   ROS: Denies fever chills nausea vomiting muscle aches pains calf pain back pain chest pain shortness of breath.  Severe hand pain and foot pain with her rheumatoid arthritis.  Wants to know if there is anything that can be done.  Past Medical History:  Diagnosis Date  . Depression   . Hyperlipidemia   . Muscle pain   . Neuropathy   . RA (rheumatoid arthritis) ([employer])   . Thyroid disease   . Vitamin B 12 deficiency    Past Surgical History:  Procedure Laterality Date  . ABDOMINAL HYSTERECTOMY    . BREAST BIOPSY Left    2 benign biopsies  . ECTOPIC PREGNANCY SURGERY    . KNEE SURGERY Right   . LAMINECTOMY    . leg vein stripping Bilateral   . TONSILLECTOMY      Current Outpatient Medications:  .  albuterol (VENTOLIN HFA) 108 (90 Base) MCG/ACT inhaler, Inhale into the lungs., Disp: , Rfl:  .  azithromycin (ZITHROMAX) 250 MG tablet, TAKE 1 TABLET BY MOUTH EVERY MONDAY WEDNESDAY AND FRIDAY, Disp: , Rfl:  .  diclofenac Sodium (VOLTAREN) 1 % GEL, Apply topically., Disp: , Rfl:  .  furosemide (LASIX) 20 MG tablet, Take by mouth., Disp: , Rfl:  .  ipratropium-albuterol (DUONEB) 0.5-2.5 (3) MG/3ML SOLN, Inhale into the lungs., Disp: , Rfl:  .  pantoprazole (PROTONIX) 40 MG tablet, Take by mouth., Disp: , Rfl:  .  predniSONE (DELTASONE) 5 MG tablet, Take by mouth., Disp: , Rfl:  .  sodium chloride HYPERTONIC 3 % nebulizer solution, Inhale into the lungs., Disp: , Rfl:  .  venlafaxine XR (EFFEXOR-XR) 150 MG 24 hr capsule, Take by mouth., Disp: , Rfl:  .   Adalimumab (HUMIRA) 40 MG/0.8ML PSKT, Inject 40 mg into the skin every 14 (fourteen) days. , Disp: , Rfl:  .  aspirin EC 81 MG tablet, Take 81 mg by mouth daily. , Disp: , Rfl:  .  atorvastatin (LIPITOR) 40 MG tablet, Take 1 tablet (40 mg total) by mouth daily at 6 PM., Disp: 30 tablet, Rfl: 1 .  cefpodoxime (VANTIN) 200 MG tablet, Take 200 mg by mouth 2 (two) times daily., Disp: , Rfl:  .  clonazePAM (KLONOPIN) 0.5 MG tablet, Take 0.5 mg by mouth 2 (two) times daily as needed. , Disp: , Rfl:  .  clopidogrel (PLAVIX) 75 MG tablet, Take 1 tablet (75 mg total) by mouth daily., Disp: 30 tablet, Rfl: 1 .  folic acid (FOLVITE) 1 MG tablet, Take 1 mg by mouth daily., Disp: , Rfl:  .  lamoTRIgine (LAMICTAL) 25 MG tablet, Take 25 mg by mouth daily., Disp: , Rfl:  .  levothyroxine (SYNTHROID, LEVOTHROID) 112 MCG tablet, Take 112 mcg by mouth daily. , Disp: , Rfl:  .  methotrexate (RHEUMATREX) 2.5 MG tablet, Take 25 mg by mouth once a week. , Disp: , Rfl:  .  mirtazapine (REMERON) 15 MG tablet, Take by mouth., Disp: , Rfl:  .  PARoxetine (  PAXIL) 20 MG tablet, Take 20 mg by mouth at bedtime. , Disp: , Rfl:  .  VITAMIN D, ERGOCALCIFEROL, PO, Take 1 tablet by mouth daily. , Disp: , Rfl:   Allergies  Allergen Reactions  . Codeine Nausea And Vomiting and Other (See Comments)  . Demerol [Meperidine] Nausea And Vomiting   Review of Systems Objective:  There were no vitals filed for this visit.  General: Well developed, nourished, in no acute distress, alert and oriented x3   Dermatological: Skin is warm, dry and supple bilateral. Nails x 10 are well maintained; remaining integument appears unremarkable at this time. There are no open sores, no preulcerative lesions, no rash or signs of infection present.  There is some early signs of skin breakdown beneath the first and second metatarsal heads of the right foot.  No open lesions or wounds.  Vascular: Dorsalis Pedis artery and Posterior Tibial artery pedal  pulses are 2/4 bilateral with immedate capillary fill time. Pedal hair growth present. No varicosities and no lower extremity edema present bilateral.   Neruologic: Grossly intact via light touch bilateral. Vibratory intact via tuning fork bilateral. Protective threshold with Semmes Wienstein monofilament intact to all pedal sites bilateral. Patellar and Achilles deep tendon reflexes 2+ bilateral. No Babinski or clonus noted bilateral.   Musculoskeletal: No gross boney pedal deformities bilateral. No pain, crepitus, or limitation noted with foot and ankle range of motion bilateral. Muscular strength 5/5 in all groups tested bilateral.  Severe hammertoe deformities bunion deformities bilateral.  Severe atrophy of fat pad resulting in pain to the metatarsal heads.  Gait: Unassisted, Nonantalgic.    Radiographs:  None taken  Assessment & Plan:   Assessment: Severe osteoarthritis with hammertoe deformities metatarsalgia  Plan: Reck recommended custom molded shoes and/or custom molded inserts for her regular shoes.  These need to be made at [employer] to accommodate her severe hammertoe deformities and plantarflexed metatarsals.  Also to help prevent skin breakdown which she is already started to develop.     Max T. Teller, [place]

## 2020-06-15 DIAGNOSIS — J8410 Pulmonary fibrosis, unspecified: Secondary | ICD-10-CM | POA: Diagnosis not present

## 2020-06-15 DIAGNOSIS — E7849 Other hyperlipidemia: Secondary | ICD-10-CM | POA: Diagnosis not present

## 2020-06-15 DIAGNOSIS — I779 Disorder of arteries and arterioles, unspecified: Secondary | ICD-10-CM | POA: Diagnosis not present

## 2020-06-15 DIAGNOSIS — D849 Immunodeficiency, unspecified: Secondary | ICD-10-CM | POA: Diagnosis not present

## 2020-06-15 DIAGNOSIS — G4733 Obstructive sleep apnea (adult) (pediatric): Secondary | ICD-10-CM | POA: Diagnosis not present

## 2020-06-15 DIAGNOSIS — D692 Other nonthrombocytopenic purpura: Secondary | ICD-10-CM | POA: Insufficient documentation

## 2020-06-15 DIAGNOSIS — F3341 Major depressive disorder, recurrent, in partial remission: Secondary | ICD-10-CM | POA: Diagnosis not present

## 2020-06-15 DIAGNOSIS — R0602 Shortness of breath: Secondary | ICD-10-CM | POA: Diagnosis not present

## 2020-06-15 DIAGNOSIS — E034 Atrophy of thyroid (acquired): Secondary | ICD-10-CM | POA: Diagnosis not present

## 2020-06-15 DIAGNOSIS — R1900 Intra-abdominal and pelvic swelling, mass and lump, unspecified site: Secondary | ICD-10-CM | POA: Diagnosis not present

## 2020-06-15 DIAGNOSIS — J9611 Chronic respiratory failure with hypoxia: Secondary | ICD-10-CM | POA: Diagnosis not present

## 2020-06-15 DIAGNOSIS — M0579 Rheumatoid arthritis with rheumatoid factor of multiple sites without organ or systems involvement: Secondary | ICD-10-CM | POA: Diagnosis not present

## 2020-06-15 DIAGNOSIS — R7309 Other abnormal glucose: Secondary | ICD-10-CM | POA: Diagnosis not present

## 2020-06-15 DIAGNOSIS — R1909 Other intra-abdominal and pelvic swelling, mass and lump: Secondary | ICD-10-CM | POA: Diagnosis not present

## 2020-06-16 ENCOUNTER — Other Ambulatory Visit: Payer: Self-pay | Admitting: Internal Medicine

## 2020-06-16 DIAGNOSIS — E7849 Other hyperlipidemia: Secondary | ICD-10-CM

## 2020-06-16 DIAGNOSIS — R1900 Intra-abdominal and pelvic swelling, mass and lump, unspecified site: Secondary | ICD-10-CM

## 2020-06-16 DIAGNOSIS — R1909 Other intra-abdominal and pelvic swelling, mass and lump: Secondary | ICD-10-CM

## 2020-06-23 ENCOUNTER — Ambulatory Visit: Payer: PPO

## 2020-06-26 DIAGNOSIS — Z8619 Personal history of other infectious and parasitic diseases: Secondary | ICD-10-CM | POA: Diagnosis not present

## 2020-07-05 ENCOUNTER — Ambulatory Visit: Admission: RE | Admit: 2020-07-05 | Payer: PPO | Source: Ambulatory Visit

## 2020-07-09 DIAGNOSIS — J45901 Unspecified asthma with (acute) exacerbation: Secondary | ICD-10-CM | POA: Diagnosis not present

## 2020-07-11 DIAGNOSIS — F5105 Insomnia due to other mental disorder: Secondary | ICD-10-CM | POA: Diagnosis not present

## 2020-07-11 DIAGNOSIS — F411 Generalized anxiety disorder: Secondary | ICD-10-CM | POA: Diagnosis not present

## 2020-07-11 DIAGNOSIS — F331 Major depressive disorder, recurrent, moderate: Secondary | ICD-10-CM | POA: Diagnosis not present

## 2020-07-14 ENCOUNTER — Inpatient Hospital Stay
Admission: EM | Admit: 2020-07-14 | Discharge: 2020-07-16 | DRG: 291 | Disposition: A | Discharge status: Home / Self Care | Payer: PPO | Attending: Internal Medicine | Admitting: Internal Medicine

## 2020-07-14 ENCOUNTER — Encounter: Payer: Self-pay | Admitting: Emergency Medicine

## 2020-07-14 ENCOUNTER — Emergency Department: Payer: PPO

## 2020-07-14 ENCOUNTER — Other Ambulatory Visit: Payer: Self-pay

## 2020-07-14 DIAGNOSIS — E538 Deficiency of other specified B group vitamins: Secondary | ICD-10-CM | POA: Diagnosis present

## 2020-07-14 DIAGNOSIS — J9610 Chronic respiratory failure, unspecified whether with hypoxia or hypercapnia: Secondary | ICD-10-CM | POA: Diagnosis not present

## 2020-07-14 DIAGNOSIS — I509 Heart failure, unspecified: Secondary | ICD-10-CM

## 2020-07-14 DIAGNOSIS — I10 Essential (primary) hypertension: Secondary | ICD-10-CM

## 2020-07-14 DIAGNOSIS — Z79899 Other long term (current) drug therapy: Secondary | ICD-10-CM | POA: Diagnosis not present

## 2020-07-14 DIAGNOSIS — G4730 Sleep apnea, unspecified: Secondary | ICD-10-CM | POA: Diagnosis not present

## 2020-07-14 DIAGNOSIS — F419 Anxiety disorder, unspecified: Secondary | ICD-10-CM | POA: Diagnosis not present

## 2020-07-14 DIAGNOSIS — Z885 Allergy status to narcotic agent status: Secondary | ICD-10-CM

## 2020-07-14 DIAGNOSIS — Z79890 Hormone replacement therapy (postmenopausal): Secondary | ICD-10-CM

## 2020-07-14 DIAGNOSIS — Z20822 Contact with and (suspected) exposure to covid-19: Secondary | ICD-10-CM | POA: Diagnosis not present

## 2020-07-14 DIAGNOSIS — G629 Polyneuropathy, unspecified: Secondary | ICD-10-CM | POA: Diagnosis not present

## 2020-07-14 DIAGNOSIS — I110 Hypertensive heart disease with heart failure: Principal | ICD-10-CM | POA: Diagnosis present

## 2020-07-14 DIAGNOSIS — Z7902 Long term (current) use of antithrombotics/antiplatelets: Secondary | ICD-10-CM

## 2020-07-14 DIAGNOSIS — Z7952 Long term (current) use of systemic steroids: Secondary | ICD-10-CM | POA: Diagnosis not present

## 2020-07-14 DIAGNOSIS — I160 Hypertensive urgency: Secondary | ICD-10-CM | POA: Diagnosis present

## 2020-07-14 DIAGNOSIS — Z803 Family history of malignant neoplasm of breast: Secondary | ICD-10-CM | POA: Diagnosis not present

## 2020-07-14 DIAGNOSIS — J90 Pleural effusion, not elsewhere classified: Secondary | ICD-10-CM | POA: Diagnosis not present

## 2020-07-14 DIAGNOSIS — Z7982 Long term (current) use of aspirin: Secondary | ICD-10-CM | POA: Diagnosis not present

## 2020-07-14 DIAGNOSIS — I5031 Acute diastolic (congestive) heart failure: Secondary | ICD-10-CM | POA: Diagnosis not present

## 2020-07-14 DIAGNOSIS — F32A Depression, unspecified: Secondary | ICD-10-CM | POA: Diagnosis present

## 2020-07-14 DIAGNOSIS — K219 Gastro-esophageal reflux disease without esophagitis: Secondary | ICD-10-CM | POA: Diagnosis present

## 2020-07-14 DIAGNOSIS — M069 Rheumatoid arthritis, unspecified: Secondary | ICD-10-CM | POA: Diagnosis not present

## 2020-07-14 DIAGNOSIS — R0602 Shortness of breath: Secondary | ICD-10-CM | POA: Diagnosis not present

## 2020-07-14 DIAGNOSIS — Z9981 Dependence on supplemental oxygen: Secondary | ICD-10-CM | POA: Diagnosis not present

## 2020-07-14 DIAGNOSIS — E039 Hypothyroidism, unspecified: Secondary | ICD-10-CM | POA: Diagnosis not present

## 2020-07-14 DIAGNOSIS — Z825 Family history of asthma and other chronic lower respiratory diseases: Secondary | ICD-10-CM | POA: Diagnosis not present

## 2020-07-14 DIAGNOSIS — E785 Hyperlipidemia, unspecified: Secondary | ICD-10-CM | POA: Diagnosis not present

## 2020-07-14 DIAGNOSIS — R040 Epistaxis: Secondary | ICD-10-CM

## 2020-07-14 LAB — CBC
HCT: 33.7 % — ABNORMAL LOW (ref 36.0–46.0)
Hemoglobin: 10.9 g/dL — ABNORMAL LOW (ref 12.0–15.0)
MCH: 32.6 pg (ref 26.0–34.0)
MCHC: 32.3 g/dL (ref 30.0–36.0)
MCV: 100.9 fL — ABNORMAL HIGH (ref 80.0–100.0)
Platelets: 202 10*3/uL (ref 150–400)
RBC: 3.34 MIL/uL — ABNORMAL LOW (ref 3.87–5.11)
RDW: 15.2 % (ref 11.5–15.5)
WBC: 4.6 10*3/uL (ref 4.0–10.5)
nRBC: 0 % (ref 0.0–0.2)

## 2020-07-14 MED ORDER — FUROSEMIDE 10 MG/ML IJ SOLN
60.0000 mg | Freq: Once | INTRAMUSCULAR | Status: AC
  Administered 2020-07-15: 60 mg via INTRAVENOUS
  Filled 2020-07-14: qty 8

## 2020-07-14 MED ORDER — SILVER NITRATE-POT NITRATE 75-25 % EX MISC
1.0000 | Freq: Once | CUTANEOUS | Status: DC
  Filled 2020-07-14: qty 10

## 2020-07-14 MED ORDER — OXYMETAZOLINE HCL 0.05 % NA SOLN
1.0000 | Freq: Once | NASAL | Status: AC
  Administered 2020-07-14: 1 via NASAL
  Filled 2020-07-14: qty 30

## 2020-07-14 MED ORDER — TRANEXAMIC ACID FOR EPISTAXIS
500.0000 mg | Freq: Once | TOPICAL | Status: AC
  Administered 2020-07-14: 500 mg via TOPICAL
  Filled 2020-07-14: qty 10

## 2020-07-14 NOTE — ED Triage Notes (Signed)
Pt reports she has been having dealing with nose bleed on and off since 3pm this afternoon reports she takes Plavix and also wears oxygen [place] 2L 24/7. Pt denies any injury. Pt talks in complete sentences no distress noted

## 2020-07-14 NOTE — ED Provider Notes (Signed)
[facility] Emergency Department Provider Note    Event Date/Time   First MD Initiated Contact with Patient 07/14/20 2258     (approximate)  I have reviewed the triage vital signs and the nursing notes.   HISTORY  Chief Complaint Epistaxis    HPI [name] is a 75 y.o. female the below listed past medical history presents to the ER for evaluation of nosebleeding started this afternoon.  Patient does wear oxygen for chronic respiratory failure.  She does take Plavix.  States she is also concerned that her legs are more swollen than typical.  She denies any trauma.  No pain.    Past Medical History:  Diagnosis Date  . Depression   . Hyperlipidemia   . Muscle pain   . Neuropathy   . RA (rheumatoid arthritis) ([place])   . Thyroid disease   . Vitamin B 12 deficiency    Family History  Problem Relation Age of Onset  . COPD Father   . Cancer Father   . Breast cancer Cousin   . Breast cancer Maternal Aunt   . Breast cancer Maternal Aunt    Past Surgical History:  Procedure Laterality Date  . ABDOMINAL HYSTERECTOMY    . BREAST BIOPSY Left    2 benign biopsies  . ECTOPIC PREGNANCY SURGERY    . KNEE SURGERY Right   . LAMINECTOMY    . leg vein stripping Bilateral   . TONSILLECTOMY     Patient Active Problem List   Diagnosis Date Noted  . Acute CHF (congestive heart failure) ([place]) 07/15/2020  . Ganglion of joint 06/12/2020  . Hyperlipidemia 06/12/2020  . Hypothyroidism due to acquired atrophy of thyroid 06/12/2020  . Recurrent major depressive disorder, in partial remission ([place]) 06/12/2020  . Rheumatoid nodulosis ([place]) 06/12/2020  . Sleep apnea 06/12/2020  . Elevated troponin 02/06/2020  . Chronic respiratory failure with hypoxia ([place]) 12/22/2018  . Immunosuppression ([place]) 12/22/2018  . Pulmonary fibrosis ([place]) 12/21/2018  . Bilateral carotid artery disease ([place]) 07/31/2016  . Acute CVA (cerebrovascular accident) ([place]) 07/17/2016  .  Other abnormal glucose 11/25/2013  . Rheumatoid arthritis with rheumatoid factor ([place]) 09/20/2013      Prior to Admission medications   Medication Sig Start Date End Date Taking? Authorizing Provider  Adalimumab (HUMIRA) 40 MG/0.8ML PSKT Inject 40 mg into the skin every 14 (fourteen) days.     [provider]  albuterol (VENTOLIN HFA) 108 (90 Base) MCG/ACT inhaler Inhale into the lungs. 10/06/18   [provider]  aspirin EC 81 MG tablet Take 81 mg by mouth daily.     [provider]  atorvastatin (LIPITOR) 40 MG tablet Take 1 tablet (40 mg total) by mouth daily at 6 PM. 07/19/16   Sainani, Belia Heman, MD  azithromycin (ZITHROMAX) 250 MG tablet TAKE 1 TABLET BY MOUTH EVERY MONDAY [facility] AND FRIDAY 10/25/19 10/24/20  [provider]  cefpodoxime (VANTIN) 200 MG tablet Take 200 mg by mouth 2 (two) times daily. 02/08/20   [provider]  clonazePAM (KLONOPIN) 0.5 MG tablet Take 0.5 mg by mouth 2 (two) times daily as needed.     [provider]  clopidogrel (PLAVIX) 75 MG tablet Take 1 tablet (75 mg total) by mouth daily. 07/19/16   Henreitta Leber, MD  diclofenac Sodium (VOLTAREN) 1 % GEL Apply topically. 02/02/18   [provider]  folic acid (FOLVITE) 1 MG tablet Take 1 mg by mouth daily.    [provider]  furosemide (LASIX) 20 MG tablet Take by mouth. 06/05/20 06/05/21  [provider]  ipratropium-albuterol (DUONEB) 0.5-2.5 (3) MG/3ML SOLN Inhale into the lungs. 11/09/19 11/03/20  [provider]  lamoTRIgine (LAMICTAL) 25 MG tablet Take 25 mg by mouth daily. 06/20/16   [provider]  levothyroxine (SYNTHROID, LEVOTHROID) 112 MCG tablet Take 112 mcg by mouth daily.     [provider]  methotrexate (RHEUMATREX) 2.5 MG tablet Take 25 mg by mouth once a week.     [provider]  mirtazapine (REMERON) 15 MG tablet Take by mouth.    [provider]  pantoprazole (PROTONIX) 40 MG  tablet Take by mouth. 02/18/20 02/17/21  [provider]  PARoxetine (PAXIL) 20 MG tablet Take 20 mg by mouth at bedtime.     [provider]  predniSONE (DELTASONE) 5 MG tablet Take by mouth. 04/18/20   [provider]  sodium chloride HYPERTONIC 3 % nebulizer solution Inhale into the lungs. 11/09/19 11/08/20  [provider]  venlafaxine XR (EFFEXOR-XR) 150 MG 24 hr capsule Take by mouth. 11/06/19   [provider]  VITAMIN D, ERGOCALCIFEROL, PO Take 1 tablet by mouth daily.     [provider]    Allergies Codeine and Demerol [meperidine]    Social History Social History   Tobacco Use  . Smoking status: Never Smoker  . Smokeless tobacco: Never Used    Review of Systems Patient denies headaches, rhinorrhea, blurry vision, numbness, shortness of breath, chest pain, edema, cough, abdominal pain, nausea, vomiting, diarrhea, dysuria, fevers, rashes or hallucinations unless otherwise stated above in HPI. ____________________________________________   PHYSICAL EXAM:  VITAL SIGNS: Vitals:   07/15/20 0115 07/15/20 0245  BP: (!) 185/111 (!) 177/73  Pulse: 85 76  Resp: 16 (!) 21  Temp:    SpO2: 95% 94%    Constitutional: Alert and oriented.  Eyes: Conjunctivae are normal.  Head: Atraumatic. Nose: No congestion/rhinnorhea.  Evidence of left anterior epistaxis.  No sign of posterior hemorrhage. Mouth/Throat: Mucous membranes are moist.   Neck: No stridor. Painless ROM.  Cardiovascular: Normal rate, regular rhythm. Grossly normal heart sounds.  Good peripheral circulation. Respiratory: Normal respiratory effort.  No retractions. Lungs CTAB. Gastrointestinal: Soft and nontender. No distention. No abdominal bruits. No CVA tenderness. Genitourinary:  Musculoskeletal: No lower extremity tenderness nor edema.  No joint effusions. Neurologic:  Normal speech and language. No gross focal neurologic deficits are appreciated. No facial  droop Skin:  Skin is warm, dry and intact. No rash noted. Psychiatric: Mood and affect are normal. Speech and behavior are normal.  ____________________________________________   LABS (all labs ordered are listed, but only abnormal results are displayed)  Results for orders placed or performed during the hospital encounter of 07/14/20 (from the past 24 hour(s))  CBC     Status: Abnormal   Collection Time: 07/14/20 11:29 PM  Result Value Ref Range   WBC 4.6 4.0 - 10.5 K/uL   RBC 3.34 (L) 3.87 - 5.11 MIL/uL   Hemoglobin 10.9 (L) 12.0 - 15.0 g/dL   HCT 33.7 (L) 36.0 - 46.0 %   MCV 100.9 (H) 80.0 - 100.0 fL   MCH 32.6 26.0 - 34.0 pg   MCHC 32.3 30.0 - 36.0 g/dL   RDW 15.2 11.5 - 15.5 %   Platelets 202 150 - 400 K/uL   nRBC 0.0 0.0 - 0.2 %  Comprehensive metabolic panel     Status: Abnormal   Collection Time: 07/14/20  11:29 PM  Result Value Ref Range   Sodium 141 135 - 145 mmol/L   Potassium 3.7 3.5 - 5.1 mmol/L   Chloride 108 98 - 111 mmol/L   CO2 25 22 - 32 mmol/L   Glucose, Bld 164 (H) 70 - 99 mg/dL   BUN 19 8 - 23 mg/dL   Creatinine, Ser 0.80 0.44 - 1.00 mg/dL   Calcium 8.7 (L) 8.9 - 10.3 mg/dL   Total Protein 6.3 (L) 6.5 - 8.1 g/dL   Albumin 3.0 (L) 3.5 - 5.0 g/dL   AST 20 15 - 41 U/L   ALT 17 0 - 44 U/L   Alkaline Phosphatase 86 38 - 126 U/L   Total Bilirubin 0.6 0.3 - 1.2 mg/dL   GFR, Estimated >60 >60 mL/min   Anion gap 8 5 - 15  Brain natriuretic peptide     Status: Abnormal   Collection Time: 07/14/20 11:29 PM  Result Value Ref Range   B Natriuretic Peptide 171.8 (H) 0.0 - 100.0 pg/mL  Troponin I (High Sensitivity)     Status: None   Collection Time: 07/14/20 11:29 PM  Result Value Ref Range   Troponin I (High Sensitivity) 17 <18 ng/L   ____________________________________________  EKG My review and personal interpretation at Time: 23:27  Indication: sob  Rate: 80  Rhythm: sinus Axis: left Other: poor r wave progression, no stemi, nonspecific st  anm ____________________________________________  RADIOLOGY  I personally reviewed all radiographic images ordered to evaluate for the above acute complaints and reviewed radiology reports and findings.  These findings were personally discussed with the patient.  Please see medical record for radiology report.  ____________________________________________   PROCEDURES  Procedure(s) performed:  .Epistaxis Management  Date/Time: 07/14/2020 11:52 PM Performed by: Merlyn Lot, MD Authorized by: Merlyn Lot, MD   Consent:    Consent obtained:  Verbal   Consent given by:  Patient   Risks, benefits, and alternatives were discussed: yes   Anesthesia:    Anesthesia method:  None Procedure details:    Treatment site:  L anterior   Treatment method:  Silver nitrate   Treatment complexity:  Limited   Treatment episode: initial   Post-procedure details:    Assessment:  Bleeding stopped   Procedure completion:  Tolerated      Critical Care performed: no ____________________________________________   INITIAL IMPRESSION / ASSESSMENT AND PLAN / ED COURSE  Pertinent labs & imaging results that were available during my care of the patient were reviewed by me and considered in my medical decision making (see chart for details).   DDX: epistaxis, Asthma, copd, CHF, pna, ptx, malignancy, Pe, anemia    [name] is a 75 y.o. who presents to the ED with presentation as described above.  Patient does have bilateral lower extremity edema on chest x-ray does show some signs of congestive heart failure which may be exacerbation of her symptoms.  Denies any chest pain.  Epistaxis management as described above.  No sign of posterior bleed.  She is otherwise hemodynamically stable.  Clinical Course as of 07/15/20 6283  Sat Jul 15, 2020  0306 Patient has had pretty significant diuresis after IV Lasix but still feeling quite dyspneic given her edema and exam findings discussed  option for hospitalization for further diuresis and medical management.  Will consult hospitalist. [PR]    Clinical Course User Index [PR] Merlyn Lot, MD    The patient was evaluated in Emergency Department today for the symptoms described in  the history of present illness. He/she was evaluated in the context of the global COVID-19 pandemic, which necessitated consideration that the patient might be at risk for infection with the SARS-CoV-2 virus that causes COVID-19. Institutional protocols and algorithms that pertain to the evaluation of patients at risk for COVID-19 are in a state of rapid change based on information released by regulatory bodies including the CDC and federal and state organizations. These policies and algorithms were followed during the patient's care in the ED.  As part of my medical decision making, I reviewed the following data within the [place] notes reviewed and incorporated, Labs reviewed, notes from prior ED visits and [employer] Controlled Substance Database   ____________________________________________   FINAL CLINICAL IMPRESSION(S) / ED DIAGNOSES  Final diagnoses:  Hypertension, unspecified type  Acute congestive heart failure, unspecified heart failure type ([place])  Anterior epistaxis      NEW MEDICATIONS STARTED DURING THIS VISIT:  New Prescriptions   No medications on file     Note:  This document was prepared using Dragon voice recognition software and may include unintentional dictation errors.    Merlyn Lot, MD 07/15/20 [phone]

## 2020-07-14 NOTE — ED Provider Notes (Signed)
MSE was initiated and I personally evaluated the patient and placed orders (if any) at  10:47 PM on July 14, 2020.  The patient appears stable so that the remainder of the MSE may be completed by another provider.   Vladimir Crofts, MD 07/14/20 [phone]

## 2020-07-15 ENCOUNTER — Inpatient Hospital Stay: Admit: 2020-07-15 | Payer: PPO

## 2020-07-15 DIAGNOSIS — Z7902 Long term (current) use of antithrombotics/antiplatelets: Secondary | ICD-10-CM | POA: Diagnosis not present

## 2020-07-15 DIAGNOSIS — Z7982 Long term (current) use of aspirin: Secondary | ICD-10-CM | POA: Diagnosis not present

## 2020-07-15 DIAGNOSIS — Z79890 Hormone replacement therapy (postmenopausal): Secondary | ICD-10-CM | POA: Diagnosis not present

## 2020-07-15 DIAGNOSIS — Z79899 Other long term (current) drug therapy: Secondary | ICD-10-CM | POA: Diagnosis not present

## 2020-07-15 DIAGNOSIS — I10 Essential (primary) hypertension: Secondary | ICD-10-CM | POA: Diagnosis not present

## 2020-07-15 DIAGNOSIS — Z803 Family history of malignant neoplasm of breast: Secondary | ICD-10-CM | POA: Diagnosis not present

## 2020-07-15 DIAGNOSIS — E039 Hypothyroidism, unspecified: Secondary | ICD-10-CM | POA: Diagnosis not present

## 2020-07-15 DIAGNOSIS — I160 Hypertensive urgency: Secondary | ICD-10-CM

## 2020-07-15 DIAGNOSIS — R040 Epistaxis: Secondary | ICD-10-CM

## 2020-07-15 DIAGNOSIS — E538 Deficiency of other specified B group vitamins: Secondary | ICD-10-CM | POA: Diagnosis present

## 2020-07-15 DIAGNOSIS — E785 Hyperlipidemia, unspecified: Secondary | ICD-10-CM | POA: Diagnosis present

## 2020-07-15 DIAGNOSIS — G4730 Sleep apnea, unspecified: Secondary | ICD-10-CM | POA: Diagnosis present

## 2020-07-15 DIAGNOSIS — I509 Heart failure, unspecified: Secondary | ICD-10-CM | POA: Diagnosis not present

## 2020-07-15 DIAGNOSIS — Z7952 Long term (current) use of systemic steroids: Secondary | ICD-10-CM | POA: Diagnosis not present

## 2020-07-15 DIAGNOSIS — I110 Hypertensive heart disease with heart failure: Secondary | ICD-10-CM | POA: Diagnosis present

## 2020-07-15 DIAGNOSIS — J9610 Chronic respiratory failure, unspecified whether with hypoxia or hypercapnia: Secondary | ICD-10-CM | POA: Diagnosis present

## 2020-07-15 DIAGNOSIS — F419 Anxiety disorder, unspecified: Secondary | ICD-10-CM

## 2020-07-15 DIAGNOSIS — Z825 Family history of asthma and other chronic lower respiratory diseases: Secondary | ICD-10-CM | POA: Diagnosis not present

## 2020-07-15 DIAGNOSIS — Z9981 Dependence on supplemental oxygen: Secondary | ICD-10-CM | POA: Diagnosis not present

## 2020-07-15 DIAGNOSIS — M069 Rheumatoid arthritis, unspecified: Secondary | ICD-10-CM | POA: Diagnosis present

## 2020-07-15 DIAGNOSIS — I5031 Acute diastolic (congestive) heart failure: Secondary | ICD-10-CM | POA: Diagnosis not present

## 2020-07-15 DIAGNOSIS — G629 Polyneuropathy, unspecified: Secondary | ICD-10-CM | POA: Diagnosis present

## 2020-07-15 DIAGNOSIS — F32A Depression, unspecified: Secondary | ICD-10-CM | POA: Diagnosis present

## 2020-07-15 DIAGNOSIS — K219 Gastro-esophageal reflux disease without esophagitis: Secondary | ICD-10-CM | POA: Diagnosis present

## 2020-07-15 DIAGNOSIS — Z20822 Contact with and (suspected) exposure to covid-19: Secondary | ICD-10-CM | POA: Diagnosis present

## 2020-07-15 DIAGNOSIS — Z885 Allergy status to narcotic agent status: Secondary | ICD-10-CM | POA: Diagnosis not present

## 2020-07-15 LAB — CBC
HCT: 34.9 % — ABNORMAL LOW (ref 36.0–46.0)
Hemoglobin: 11.5 g/dL — ABNORMAL LOW (ref 12.0–15.0)
MCH: 33.1 pg (ref 26.0–34.0)
MCHC: 33 g/dL (ref 30.0–36.0)
MCV: 100.6 fL — ABNORMAL HIGH (ref 80.0–100.0)
Platelets: 217 10*3/uL (ref 150–400)
RBC: 3.47 MIL/uL — ABNORMAL LOW (ref 3.87–5.11)
RDW: 15.4 % (ref 11.5–15.5)
WBC: 5.7 10*3/uL (ref 4.0–10.5)
nRBC: 0 % (ref 0.0–0.2)

## 2020-07-15 LAB — BASIC METABOLIC PANEL
Anion gap: 7 (ref 5–15)
BUN: 17 mg/dL (ref 8–23)
CO2: 30 mmol/L (ref 22–32)
Calcium: 8.7 mg/dL — ABNORMAL LOW (ref 8.9–10.3)
Chloride: 105 mmol/L (ref 98–111)
Creatinine, Ser: 0.83 mg/dL (ref 0.44–1.00)
GFR, Estimated: >60 mL/min (ref >60)
Glucose, Bld: 114 mg/dL — ABNORMAL HIGH (ref 70–99)
Potassium: 3.2 mmol/L — ABNORMAL LOW (ref 3.5–5.1)
Sodium: 142 mmol/L (ref 135–145)

## 2020-07-15 LAB — COMPREHENSIVE METABOLIC PANEL
ALT: 17 U/L (ref 0–44)
AST: 20 U/L (ref 15–41)
Albumin: 3 g/dL — ABNORMAL LOW (ref 3.5–5.0)
Alkaline Phosphatase: 86 U/L (ref 38–126)
Anion gap: 8 (ref 5–15)
BUN: 19 mg/dL (ref 8–23)
CO2: 25 mmol/L (ref 22–32)
Calcium: 8.7 mg/dL — ABNORMAL LOW (ref 8.9–10.3)
Chloride: 108 mmol/L (ref 98–111)
Creatinine, Ser: 0.8 mg/dL (ref 0.44–1.00)
GFR, Estimated: >60 mL/min (ref >60)
Glucose, Bld: 164 mg/dL — ABNORMAL HIGH (ref 70–99)
Potassium: 3.7 mmol/L (ref 3.5–5.1)
Sodium: 141 mmol/L (ref 135–145)
Total Bilirubin: 0.6 mg/dL (ref 0.3–1.2)
Total Protein: 6.3 g/dL — ABNORMAL LOW (ref 6.5–8.1)

## 2020-07-15 LAB — RESP PANEL BY RT-PCR (FLU A&B, COVID) ARPGX2
Influenza A by PCR: NEGATIVE
Influenza B by PCR: NEGATIVE
SARS Coronavirus 2 by RT PCR: NEGATIVE

## 2020-07-15 LAB — TROPONIN I (HIGH SENSITIVITY)
Troponin I (High Sensitivity): 17 ng/L (ref <18)
Troponin I (High Sensitivity): 24 ng/L — ABNORMAL HIGH (ref <18)

## 2020-07-15 LAB — TSH: TSH: 0.046 u[IU]/mL — ABNORMAL LOW (ref 0.350–4.500)

## 2020-07-15 LAB — BRAIN NATRIURETIC PEPTIDE: B Natriuretic Peptide: 171.8 pg/mL — ABNORMAL HIGH (ref 0.0–100.0)

## 2020-07-15 MED ORDER — TRAZODONE HCL 50 MG PO TABS
25.0000 mg | ORAL_TABLET | Freq: Every evening | ORAL | Status: DC | PRN
  Filled 2020-07-15: qty 1

## 2020-07-15 MED ORDER — CLONAZEPAM 0.5 MG PO TABS: 0.5000 mg | ORAL_TABLET | Freq: Two times a day (BID) | ORAL | Status: DC | PRN

## 2020-07-15 MED ORDER — ASPIRIN EC 81 MG PO TBEC
81.0000 mg | DELAYED_RELEASE_TABLET | Freq: Every day | ORAL | Status: DC
  Administered 2020-07-15 – 2020-07-16 (×2): 81 mg via ORAL
  Filled 2020-07-15 (×2): qty 1

## 2020-07-15 MED ORDER — METHOTREXATE 2.5 MG PO TABS: 25.0000 mg | ORAL_TABLET | ORAL | Status: DC

## 2020-07-15 MED ORDER — POTASSIUM CHLORIDE CRYS ER 20 MEQ PO TBCR
40.0000 meq | EXTENDED_RELEASE_TABLET | Freq: Once | ORAL | Status: AC
  Administered 2020-07-15: 40 meq via ORAL
  Filled 2020-07-15: qty 2

## 2020-07-15 MED ORDER — IPRATROPIUM-ALBUTEROL 0.5-2.5 (3) MG/3ML IN SOLN: 3.0000 mL | RESPIRATORY_TRACT | Status: DC | PRN

## 2020-07-15 MED ORDER — SODIUM CHLORIDE 0.9 % IV SOLN: 250.0000 mL | INTRAVENOUS | Status: DC | PRN

## 2020-07-15 MED ORDER — ENOXAPARIN SODIUM 40 MG/0.4ML ~~LOC~~ SOLN
40.0000 mg | SUBCUTANEOUS | Status: DC
  Administered 2020-07-15 – 2020-07-16 (×2): 40 mg via SUBCUTANEOUS
  Filled 2020-07-15 (×2): qty 0.4

## 2020-07-15 MED ORDER — PAROXETINE HCL 20 MG PO TABS: 20.0000 mg | ORAL_TABLET | Freq: Every day | ORAL | Status: DC

## 2020-07-15 MED ORDER — VENLAFAXINE HCL ER 75 MG PO CP24
150.0000 mg | ORAL_CAPSULE | Freq: Every day | ORAL | Status: DC
  Administered 2020-07-15 – 2020-07-16 (×2): 150 mg via ORAL
  Filled 2020-07-15 (×2): qty 1
  Filled 2020-07-15: qty 2

## 2020-07-15 MED ORDER — MAGNESIUM HYDROXIDE 400 MG/5ML PO SUSP: 30.0000 mL | Freq: Every day | ORAL | Status: DC | PRN

## 2020-07-15 MED ORDER — ATORVASTATIN CALCIUM 20 MG PO TABS
40.0000 mg | ORAL_TABLET | Freq: Every day | ORAL | Status: DC
  Administered 2020-07-15: 40 mg via ORAL
  Filled 2020-07-15: qty 2

## 2020-07-15 MED ORDER — VITAMIN D (ERGOCALCIFEROL) 1.25 MG (50000 UNIT) PO CAPS: 50000.0000 [IU] | ORAL_CAPSULE | ORAL | Status: DC

## 2020-07-15 MED ORDER — METHOTREXATE 2.5 MG PO TABS
20.0000 mg | ORAL_TABLET | ORAL | Status: DC
  Administered 2020-07-16: 20 mg via ORAL
  Filled 2020-07-15: qty 8

## 2020-07-15 MED ORDER — SODIUM CHLORIDE 3 % IN NEBU
4.0000 mL | INHALATION_SOLUTION | Freq: Once | RESPIRATORY_TRACT | Status: DC | PRN
  Filled 2020-07-15: qty 4

## 2020-07-15 MED ORDER — LEVOTHYROXINE SODIUM 112 MCG PO TABS
112.0000 ug | ORAL_TABLET | Freq: Every day | ORAL | Status: DC
  Administered 2020-07-15 – 2020-07-16 (×2): 112 ug via ORAL
  Filled 2020-07-15 (×2): qty 1

## 2020-07-15 MED ORDER — SODIUM CHLORIDE 0.9% FLUSH: 3.0000 mL | INTRAVENOUS | Status: DC | PRN

## 2020-07-15 MED ORDER — MIRTAZAPINE 15 MG PO TABS
15.0000 mg | ORAL_TABLET | Freq: Every day | ORAL | Status: DC
  Administered 2020-07-15: 15 mg via ORAL
  Filled 2020-07-15: qty 1

## 2020-07-15 MED ORDER — LAMOTRIGINE 25 MG PO TABS
25.0000 mg | ORAL_TABLET | Freq: Every day | ORAL | Status: DC
  Administered 2020-07-15 – 2020-07-16 (×2): 25 mg via ORAL
  Filled 2020-07-15 (×2): qty 1

## 2020-07-15 MED ORDER — FOLIC ACID 1 MG PO TABS
1.0000 mg | ORAL_TABLET | Freq: Every day | ORAL | Status: DC
  Administered 2020-07-15 – 2020-07-16 (×2): 1 mg via ORAL
  Filled 2020-07-15 (×2): qty 1

## 2020-07-15 MED ORDER — PANTOPRAZOLE SODIUM 40 MG PO TBEC
40.0000 mg | DELAYED_RELEASE_TABLET | Freq: Every day | ORAL | Status: DC
  Administered 2020-07-15 – 2020-07-16 (×2): 40 mg via ORAL
  Filled 2020-07-15 (×2): qty 1

## 2020-07-15 MED ORDER — LABETALOL HCL 5 MG/ML IV SOLN
5.0000 mg | Freq: Once | INTRAVENOUS | Status: AC
  Administered 2020-07-15: 5 mg via INTRAVENOUS
  Filled 2020-07-15: qty 4

## 2020-07-15 MED ORDER — CLOPIDOGREL BISULFATE 75 MG PO TABS
75.0000 mg | ORAL_TABLET | Freq: Every day | ORAL | Status: DC
  Administered 2020-07-15 – 2020-07-16 (×2): 75 mg via ORAL
  Filled 2020-07-15 (×2): qty 1

## 2020-07-15 MED ORDER — SODIUM CHLORIDE 0.9% FLUSH
3.0000 mL | Freq: Two times a day (BID) | INTRAVENOUS | Status: DC
  Administered 2020-07-15 – 2020-07-16 (×3): 3 mL via INTRAVENOUS

## 2020-07-15 MED ORDER — FUROSEMIDE 10 MG/ML IJ SOLN
40.0000 mg | Freq: Four times a day (QID) | INTRAMUSCULAR | Status: DC
  Administered 2020-07-15 – 2020-07-16 (×5): 40 mg via INTRAVENOUS
  Filled 2020-07-15 (×5): qty 4

## 2020-07-15 NOTE — ED Notes (Signed)
Informed RN bed assigned 1326

## 2020-07-15 NOTE — Consult Note (Signed)
Cardiology Consultation:   Patient ID: [name] MRN: [id number]; DOB: Aug 21, 1945  Admit date: 07/14/2020 Date of Consult: 07/15/2020  PCP:  Adin Hector, MD   [facility]  Cardiologist:  No primary care provider on file. New. " I want Dr. Audelia Acton to be my heart doctor" Advanced Practice Provider:  No care team member to display Electrophysiologist:  None   Patient Profile:   [name] is a 75 y.o. female with a hx of sob who is being seen today for the evaluation of acute CHF at the request of Dr. Reesa Chew.  History of Present Illness:   [name] is a pleasant 75 yo woman with a h/o dyslipidemia and thyroid dysfunction, COPD/Bronchiectasis, who is admitted with worsening sob, HTN and peripheral edema and pulmonary infiltrates consistent with new onset CHF. She has been sob for several weeks and was placed on oral lasix and then torsemide but has had continued dyspnea. She presented with CHF and has been given IV lasix and her symptoms have improved. Troponin negative. She denies anginal symptoms.    Past Medical History:  Diagnosis Date  . Depression   . Hyperlipidemia   . Muscle pain   . Neuropathy   . RA (rheumatoid arthritis) ([place])   . Thyroid disease   . Vitamin B 12 deficiency     Past Surgical History:  Procedure Laterality Date  . ABDOMINAL HYSTERECTOMY    . BREAST BIOPSY Left    2 benign biopsies  . ECTOPIC PREGNANCY SURGERY    . KNEE SURGERY Right   . LAMINECTOMY    . leg vein stripping Bilateral   . TONSILLECTOMY         Inpatient Medications: Scheduled Meds: . aspirin EC  81 mg Oral Daily  . atorvastatin  40 mg Oral q1800  . clopidogrel  75 mg Oral Daily  . enoxaparin (LOVENOX) injection  40 mg Subcutaneous Q24H  . folic acid  1 mg Oral Daily  . furosemide  40 mg Intravenous Q6H  . lamoTRIgine  25 mg Oral Daily  . levothyroxine  112 mcg Oral Daily  . [START ON 07/16/2020] methotrexate  20 mg Oral Weekly  .  mirtazapine  15 mg Oral QHS  . pantoprazole  40 mg Oral Daily  . silver nitrate applicators  1 Stick Topical Once  . sodium chloride flush  3 mL Intravenous Q12H  . venlafaxine XR  150 mg Oral Q breakfast  . [START ON 07/22/2020] Vitamin D (Ergocalciferol)  50,000 Units Oral Q7 days   Continuous Infusions: . sodium chloride     PRN Meds: sodium chloride, clonazePAM, ipratropium-albuterol, magnesium hydroxide, sodium chloride flush, sodium chloride HYPERTONIC, traZODone  Allergies:    Allergies  Allergen Reactions  . Codeine Nausea And Vomiting and Other (See Comments)  . Demerol [Meperidine] Nausea And Vomiting    Social History:   Social History   Socioeconomic History  . Marital status: Married    Spouse name: Not on file  . Number of children: Not on file  . Years of education: Not on file  . Highest education level: Not on file  Occupational History  . Not on file  Tobacco Use  . Smoking status: Never Smoker  . Smokeless tobacco: Never Used  Substance and Sexual Activity  . Alcohol use: Not on file  . Drug use: Not on file  . Sexual activity: Not on file  Other Topics Concern  . Not on file  Social History Narrative  . Not on file   Social Determinants of Health   Financial Resource Strain: Not on file  Food Insecurity: Not on file  Transportation Needs: Not on file  Physical Activity: Not on file  Stress: Not on file  Social Connections: Not on file  Intimate Partner Violence: Not on file    Family History:    Family History  Problem Relation Age of Onset  . COPD Father   . Cancer Father   . Breast cancer Cousin   . Breast cancer Maternal Aunt   . Breast cancer Maternal Aunt      ROS:  Please see the history of present illness.   All other ROS reviewed and negative.     Physical Exam/Data:   Vitals:   07/15/20 0905 07/15/20 0930 07/15/20 1000 07/15/20 1009  BP:  (!) 161/73 (!) 142/65   Pulse: 83 88 80 76  Resp: 16 (!) 21 (!) 26 20  Temp:       TempSrc:      SpO2: 94% 93% 97% 98%  Weight:      Height:        Intake/Output Summary (Last 24 hours) at 07/15/2020 1158 Last data filed at 07/15/2020 0800 Gross per 24 hour  Intake --  Output 2350 ml  Net -2350 ml   Last 3 Weights 07/14/2020 07/19/2016 07/18/2016  Weight (lbs) 172 lb 161 lb 4.8 oz 161 lb 9.6 oz  Weight (kg) 78.019 kg 73.165 kg 73.301 kg     Body mass index is 29.52 kg/m.  General:  Well nourished, well developed, in no acute distress HEENT: normal Lymph: no adenopathy Neck: no JVD Endocrine:  No thryomegaly Vascular: No carotid bruits; FA pulses 2+ bilaterally without bruits  Cardiac:  normal S1, S2; RRR; 1/6 systolic murmur  Lungs:  Scattered basilar rales with no increased work of breathing Abd: soft, nontender, no hepatomegaly  Ext: no edema Musculoskeletal:  No deformities, BUE and BLE strength normal and equal Skin: warm and dry  Neuro:  CNs 2-12 intact, no focal abnormalities noted Psych:  Normal affect   EKG:  The EKG was personally reviewed and demonstrates:  nsr Telemetry:  Telemetry was personally reviewed and demonstrates:  nsr  Relevant CV Studies: 2d echo is pending  Laboratory Data:  High Sensitivity Troponin:   Recent Labs  Lab 07/14/20 2329 07/15/20 0240  TROPONINIHS 17 24*     Chemistry Recent Labs  Lab 07/14/20 2329 07/15/20 0607  NA 141 142  K 3.7 3.2*  CL 108 105  CO2 25 30  GLUCOSE 164* 114*  BUN 19 17  CREATININE 0.80 0.83  CALCIUM 8.7* 8.7*  GFRNONAA >60 >60  ANIONGAP 8 7    Recent Labs  Lab 07/14/20 2329  PROT 6.3*  ALBUMIN 3.0*  AST 20  ALT 17  ALKPHOS 86  BILITOT 0.6   Hematology Recent Labs  Lab 07/14/20 2329 07/15/20 0607  WBC 4.6 5.7  RBC 3.34* 3.47*  HGB 10.9* 11.5*  HCT 33.7* 34.9*  MCV 100.9* 100.6*  MCH 32.6 33.1  MCHC 32.3 33.0  RDW 15.2 15.4  PLT 202 217   BNP Recent Labs  Lab 07/14/20 2329  BNP 171.8*    DDimer No results for input(s): DDIMER in the last 168  hours.   Radiology/Studies:  DG Chest Portable 1 View  Result Date: 07/14/2020 CLINICAL DATA:  Shortness of breath EXAM: PORTABLE CHEST 1 VIEW COMPARISON:  08/28/2005 FINDINGS: Cardiac shadow is within  normal limits. Mild aortic calcifications are seen. Lungs are well aerated bilaterally with small right-sided pleural effusion. Mild vascular congestion is noted without interstitial edema. No acute bony abnormality is noted. IMPRESSION: Mild vascular congestion and small right pleural effusion. Electronically Signed   By: Inez Catalina M.D.   On: 07/14/2020 23:29     Assessment and Plan:   1. Acute on chronic likely diastolic heart failure - 2D echo is pending. She will be treated with IV lasix and additional treatment based on the results of her echo. 2. HTN - her bp was up on presentation but is improved. We will follow. 3. Anxiety - she is quite anxious and I have tried to encourage her.        For questions or updates, please contact [employer] Please consult [email or web address] for contact info under    Signed, Cristopher Peru, MD  07/15/2020 11:58 AM

## 2020-07-15 NOTE — ED Notes (Signed)
Pt stated she was agitated feeling anxious, offered prn anxiety med pt declined. Pt was given phone to call family

## 2020-07-15 NOTE — ED Notes (Signed)
Patient denies pain or shortness of breath. Awaiting bed status on unit floor floor. Vss. Safety maintained.

## 2020-07-15 NOTE — ED Notes (Signed)
Pt husband at bedside, he brought her walker for her to use the restroom if needed

## 2020-07-15 NOTE — Progress Notes (Signed)
No charge progress note.  [name] is a 75 y.o. Caucasian female with medical history significant for dyslipidemia, hypothyroidism, vitamin B12 deficiency, chronic respiratory failure on home O2 at 2 L/min, peripheral neuropathy and depression, who presented to the ER with acute onset of epistaxis.  Patient has been having dyspnea and orthopnea with worsening lower extremity edema.  She admits to dyspnea on exertion as well as dry cough and occasional wheezing.  No fever or chills.  Patient was found to have BNP of 171, troponin negative, chest x-ray concerning for vascular congestion and small right pleural effusion.   Patient was using Lasix at home, recently switched to torsemide with no significant change in her dyspnea.  Patient received topical tranexamic acid and Afrin.  She was cauterized with silver nitrate with adequate hemostasis for her epistaxis.   Her symptoms improved with IV Lasix.  Cardiology was also consulted.  Patient wants to get established with Dr. Fletcher Anon stating that he was taking care of her husband and son.  Echocardiogram ordered-pending.  Patient was seen and examined today.  She was trying to tell her life time stories, appears little anxious but comfortable.  Per patient her breathing status has been improved.  Continues to have few basal crackles and 2+ lower extremity edema.  -Continue with current management. -Further cardiology work-up pending on echo results. -If remains stable and no further cardiology work-up needed then she can be discharged home tomorrow on higher dose of torsemide.

## 2020-07-15 NOTE — TOC Initial Note (Addendum)
Transition of Care [facility]) - Initial/Assessment Note    Patient Details  Name: [name] MRN: [id number] Date of Birth: 1946-03-16  [facility]) CM/SW Contact:    Elliot Gurney [place], [place] Phone Number: 07/15/2020, 10:56 AM  Clinical Narrative:                 Met with patient in the ED to complete heart failure screen. Patient presented to the ED for evaluation due to a nose bleed.  Patient resides with spouse who provides daily care for patient. Patient sleeps in a recliner on the first floor of her home.  Per patient, she also has a niece Marline Backbone who is a Marine scientist and will be helping patient post discharge. Patient states that she has oxygen  through [place] prescribed by pulmonologist Dr. Lanney Gins.  She also has a nebulizer, cain and a walker. [facility] is her pharmacy. Per patient, she does have a scale and weighs daily but is not followed by a cardiologist. Patient informed that an appointment with a heart failure clinic will be made before discharge. Home Health can also be arranged with an agency that provides a heart failure protocol.  TOC to continue to follow for discharge needs.   Sheralyn Boatman [facility] Care Management [phone]  Expected Discharge Plan: Home/Self Care Barriers to Discharge: Continued Medical Work up   Patient Goals and CMS Choice Patient states their goals for this hospitalization and ongoing recovery are:: "to get much better and get up and moving"      Expected Discharge Plan and Services Expected Discharge Plan: Home/Self Care In-house Referral: Clinical Social Work     Living arrangements for the past 2 months: Single Family Home                                      Prior Living Arrangements/Services Living arrangements for the past 2 months: Single Family Home Lives with:: Spouse Patient language and need for interpreter reviewed:: No Do you feel safe going back to the place where you live?:  Yes      Need for Family Participation in Patient Care: Yes (Comment) Care giver support system in place?: Yes (comment) Current home services: DME (oxygen-[employer] nebulizer, walker,) Criminal Activity/Legal Involvement Pertinent to Current Situation/Hospitalization: No - Comment as needed  Activities of Daily Living      Permission Sought/Granted   Permission granted to share information with : Yes, Verbal Permission Granted        Permission granted to share info w Relationship: Brittnei Jagiello  Permission granted to share info w Contact Information: [phone]  Emotional Assessment Appearance:: Appears stated age Attitude/Demeanor/Rapport: Engaged Affect (typically observed): Accepting,Adaptable Orientation: : Oriented to Self,Oriented to Place,Oriented to  Time,Oriented to Situation   Psych Involvement: No (comment)  Admission diagnosis:  Acute CHF (congestive heart failure) ([place]) [I50.9] Patient Active Problem List   Diagnosis Date Noted  . Acute CHF (congestive heart failure) ([place]) 07/15/2020  . Ganglion of joint 06/12/2020  . Hyperlipidemia 06/12/2020  . Hypothyroidism due to acquired atrophy of thyroid 06/12/2020  . Recurrent major depressive disorder, in partial remission ([place]) 06/12/2020  . Rheumatoid nodulosis ([place]) 06/12/2020  . Sleep apnea 06/12/2020  . Elevated troponin 02/06/2020  . Chronic respiratory failure with hypoxia ([place]) 12/22/2018  . Immunosuppression ([place]) 12/22/2018  . Pulmonary fibrosis ([place]) 12/21/2018  . Bilateral carotid artery disease ([place]) 07/31/2016  .  Acute CVA (cerebrovascular accident) ([place]) 07/17/2016  . Other abnormal glucose 11/25/2013  . Rheumatoid arthritis with rheumatoid factor ([place]) 09/20/2013   PCP:  Adin Hector, MD Pharmacy:   [facility], [address] [place] [address] Phone: [phone] Fax: [phone]     Social Determinants of Health (SDOH) Interventions     Readmission Risk Interventions No flowsheet data found.

## 2020-07-15 NOTE — H&P (Signed)
[employer]   PATIENT NAME: [name]    MR#:  [id number]  DATE OF BIRTH:  12-04-1945  DATE OF ADMISSION:  07/14/2020  PRIMARY CARE PHYSICIAN: Adin Hector, MD   Patient is coming from: Home.  REQUESTING/REFERRING PHYSICIAN: Merlyn Lot, MD CHIEF COMPLAINT:   Chief Complaint  Patient presents with  . Epistaxis    HISTORY OF PRESENT ILLNESS:  SELINE Moss is a 75 y.o. Caucasian female with medical history significant for dyslipidemia, hypothyroidism, vitamin B12 deficiency, chronic respiratory failure on home O2 at 2 L/min, peripheral neuropathy and depression, who presented to the ER with acute onset of epistaxis.  Patient has been having dyspnea and orthopnea with worsening lower extremity edema.  She admits to dyspnea on exertion as well as dry cough and occasional wheezing.  No fever or chills.  No nausea or vomiting or abdominal pain.  No dysuria, oliguria or hematuria or flank pain. ED Course: When she came to the ER, blood pressure was elevated at 179/75 with pulse currently 96% on her 2 L of O2 by nasal cannula otherwise with otherwise normal vital signs.  Labs revealed a BNP of 171.8 and high-sensitivity troponin I of 17 and later 24.  CBC showed anemia with hemoglobin 10.9 hematocrit 33.7.  Influenza antigens and COVID-19 PCR came back negative. EKG as reviewed by me : Showed sinus rhythm with a rate of 83 with poor R wave progression and low voltage QRS and LVH by repolarization abnormality. Imaging: Chest x-ray showed mild vascular congestion and small right pleural effusion.  The patient was given 60 mg of IV Lasix, 5 mg of IV labetalol, topical tranexamic acid and Afrin.  She was cauterized with silver nitrate with adequate hemostasis for her epistaxis.  She will be admitted to a progressive unit bed for further evaluation and management PAST MEDICAL HISTORY:   Past Medical History:  Diagnosis Date  . Depression   . Hyperlipidemia   . Muscle pain    . Neuropathy   . RA (rheumatoid arthritis) ([place])   . Thyroid disease   . Vitamin B 12 deficiency     PAST SURGICAL HISTORY:   Past Surgical History:  Procedure Laterality Date  . ABDOMINAL HYSTERECTOMY    . BREAST BIOPSY Left    2 benign biopsies  . ECTOPIC PREGNANCY SURGERY    . KNEE SURGERY Right   . LAMINECTOMY    . leg vein stripping Bilateral   . TONSILLECTOMY      SOCIAL HISTORY:   Social History   Tobacco Use  . Smoking status: Never Smoker  . Smokeless tobacco: Never Used  Substance Use Topics  . Alcohol use: Not on file    FAMILY HISTORY:   Family History  Problem Relation Age of Onset  . COPD Father   . Cancer Father   . Breast cancer Cousin   . Breast cancer Maternal Aunt   . Breast cancer Maternal Aunt     DRUG ALLERGIES:   Allergies  Allergen Reactions  . Codeine Nausea And Vomiting and Other (See Comments)  . Demerol [Meperidine] Nausea And Vomiting    REVIEW OF SYSTEMS:   ROS As per history of present illness. All pertinent systems were reviewed above. Constitutional, HEENT, cardiovascular, respiratory, GI, GU, musculoskeletal, neuro, psychiatric, endocrine, integumentary and hematologic systems were reviewed and are otherwise negative/unremarkable except for positive findings mentioned above in the HPI.   MEDICATIONS AT HOME:   Prior to Admission medications  Medication Sig Start Date End Date Taking? Authorizing Provider  Adalimumab (HUMIRA) 40 MG/0.8ML PSKT Inject 40 mg into the skin every 14 (fourteen) days.     [provider]  albuterol (VENTOLIN HFA) 108 (90 Base) MCG/ACT inhaler Inhale into the lungs. 10/06/18   [provider]  aspirin EC 81 MG tablet Take 81 mg by mouth daily.     [provider]  atorvastatin (LIPITOR) 40 MG tablet Take 1 tablet (40 mg total) by mouth daily at 6 PM. 07/19/16   Sainani, Belia Heman, MD  azithromycin (ZITHROMAX) 250 MG tablet TAKE 1 TABLET BY MOUTH EVERY MONDAY [facility]  AND FRIDAY 10/25/19 10/24/20  [provider]  cefpodoxime (VANTIN) 200 MG tablet Take 200 mg by mouth 2 (two) times daily. 02/08/20   [provider]  clonazePAM (KLONOPIN) 0.5 MG tablet Take 0.5 mg by mouth 2 (two) times daily as needed.     [provider]  clopidogrel (PLAVIX) 75 MG tablet Take 1 tablet (75 mg total) by mouth daily. 07/19/16   Henreitta Leber, MD  diclofenac Sodium (VOLTAREN) 1 % GEL Apply topically. 02/02/18   [provider]  folic acid (FOLVITE) 1 MG tablet Take 1 mg by mouth daily.    [provider]  furosemide (LASIX) 20 MG tablet Take by mouth. 06/05/20 06/05/21  [provider]  ipratropium-albuterol (DUONEB) 0.5-2.5 (3) MG/3ML SOLN Inhale into the lungs. 11/09/19 11/03/20  [provider]  lamoTRIgine (LAMICTAL) 25 MG tablet Take 25 mg by mouth daily. 06/20/16   [provider]  levothyroxine (SYNTHROID, LEVOTHROID) 112 MCG tablet Take 112 mcg by mouth daily.     [provider]  methotrexate (RHEUMATREX) 2.5 MG tablet Take 25 mg by mouth once a week.     [provider]  mirtazapine (REMERON) 15 MG tablet Take by mouth.    [provider]  pantoprazole (PROTONIX) 40 MG tablet Take by mouth. 02/18/20 02/17/21  [provider]  PARoxetine (PAXIL) 20 MG tablet Take 20 mg by mouth at bedtime.     [provider]  predniSONE (DELTASONE) 5 MG tablet Take by mouth. 04/18/20   [provider]  sodium chloride HYPERTONIC 3 % nebulizer solution Inhale into the lungs. 11/09/19 11/08/20  [provider]  venlafaxine XR (EFFEXOR-XR) 150 MG 24 hr capsule Take by mouth. 11/06/19   [provider]  VITAMIN D, ERGOCALCIFEROL, PO Take 1 tablet by mouth daily.     [provider]      VITAL SIGNS:  Blood pressure (!) 126/101, pulse 71, temperature 98.4 F (36.9 C), temperature source Oral, resp. rate (!) 27, height 5\' 4"  (1.626 m), weight 78 kg,  SpO2 97 %.  PHYSICAL EXAMINATION:  Physical Exam  GENERAL:  75 y.o.-year-old Caucasian female patient lying in the bed with mild respiratory distress with conversational dyspnea. EYES: Pupils equal, round, reactive to light and accommodation. No scleral icterus. Extraocular muscles intact.  HEENT: Head atraumatic, normocephalic. Oropharynx and nasopharynx clear.  NECK:  Supple, no jugular venous distention. No thyroid enlargement, no tenderness.  LUNGS: Slightly diminished bibasal breath sounds with mild bibasilar rales.  No use of accessory muscles of respiration.  CARDIOVASCULAR: Regular rate and rhythm, S1, S2 normal. No murmurs, rubs, or gallops.  ABDOMEN: Soft, nondistended, nontender. Bowel sounds present. No organomegaly or mass.  EXTREMITIES: 2+ pedal extremity pitting edema with no cyanosis, or clubbing.  NEUROLOGIC: Cranial nerves II through XII are intact. Muscle strength 5/5 in all  extremities. Sensation intact. Gait not checked.  PSYCHIATRIC: The patient is alert and oriented x 3.  Normal affect and good eye contact. SKIN: No obvious rash, lesion, or ulcer.   LABORATORY PANEL:   CBC Recent Labs  Lab 07/14/20 2329  WBC 4.6  HGB 10.9*  HCT 33.7*  PLT 202   ------------------------------------------------------------------------------------------------------------------  Chemistries  Recent Labs  Lab 07/14/20 2329  NA 141  K 3.7  CL 108  CO2 25  GLUCOSE 164*  BUN 19  CREATININE 0.80  CALCIUM 8.7*  AST 20  ALT 17  ALKPHOS 86  BILITOT 0.6   ------------------------------------------------------------------------------------------------------------------  Cardiac Enzymes No results for input(s): TROPONINI in the last 168 hours. ------------------------------------------------------------------------------------------------------------------  RADIOLOGY:  DG Chest Portable 1 View  Result Date: 07/14/2020 CLINICAL DATA:  Shortness of breath EXAM: PORTABLE  CHEST 1 VIEW COMPARISON:  08/28/2005 FINDINGS: Cardiac shadow is within normal limits. Mild aortic calcifications are seen. Lungs are well aerated bilaterally with small right-sided pleural effusion. Mild vascular congestion is noted without interstitial edema. No acute bony abnormality is noted. IMPRESSION: Mild vascular congestion and small right pleural effusion. Electronically Signed   By: Inez Catalina M.D.   On: 07/14/2020 23:29      IMPRESSION AND PLAN:  Active Problems:   Acute CHF (congestive heart failure) ([place])  1.  Acute like diastolic CHF. - The patient will be admitted to a progressive unit bed. - We will continue diuresis with IV Lasix. - We will follow serial troponin I's. - 2D echo and a cardiology consult to be of obtained. - I notified Dr. Fletcher Anon about the patient.  2.  Hypertensive urgency could be the culprit for #1 #3. - The patient will be placed on as needed IV labetalol and will continue her antihypertensives.  3.  Epistaxis that could be related to #2. - She currently has good hemostasis after Afrin, topical TXA and silver nitrate cauterization.  4.  Hypothyroidism. - We will continue Synthroid.  5.  Rheumatoid arthritis. - We will continue methotrexate.  6.  GERD. - We will continue Protonix.  7.  Dyslipidemia. - We will continue statin therapy.  8.  Anxiety. - We will continue Klonopin.  DVT prophylaxis: Lovenox. Code Status: full code. Family Communication:  The plan of care was discussed in details with the patient (with no family member at bedside). I answered all questions. The patient agreed to proceed with the above mentioned plan. Further management will depend upon hospital course. Disposition Plan: Back to previous home environment Consults called: Cardiology. All the records are reviewed and case discussed with ED provider.  Status is: Inpatient  Remains inpatient appropriate because:Ongoing diagnostic testing needed not appropriate  for outpatient work up, Unsafe d/c plan, IV treatments appropriate due to intensity of illness or inability to take PO and Inpatient level of care appropriate due to severity of illness   Dispo: The patient is from: Home              Anticipated d/c is to: Home              Patient currently is not medically stable to d/c.   Difficult to place patient No   TOTAL TIME TAKING CARE OF THIS PATIENT: 55 minutes.    Christel Mormon M.D on 07/15/2020 at 4:00 AM  [facility]   From 7 PM-7 AM, contact night-coverage [email or web address]  CC: Primary care physician; Adin Hector, MD

## 2020-07-15 NOTE — ED Notes (Signed)
Lunch provided.

## 2020-07-15 NOTE — ED Notes (Signed)
Pt ambulated in room. No drop in O2 saturation noted. MD notified.

## 2020-07-16 LAB — BASIC METABOLIC PANEL
Anion gap: 13 (ref 5–15)
BUN: 18 mg/dL (ref 8–23)
CO2: 28 mmol/L (ref 22–32)
Calcium: 9.1 mg/dL (ref 8.9–10.3)
Chloride: 100 mmol/L (ref 98–111)
Creatinine, Ser: 0.97 mg/dL (ref 0.44–1.00)
GFR, Estimated: >60 mL/min (ref >60)
Glucose, Bld: 105 mg/dL — ABNORMAL HIGH (ref 70–99)
Potassium: 3.6 mmol/L (ref 3.5–5.1)
Sodium: 141 mmol/L (ref 135–145)

## 2020-07-16 MED ORDER — LEVOTHYROXINE SODIUM 112 MCG PO TABS: 112.0000 ug | ORAL_TABLET | Freq: Every day | ORAL | 1 refills | Status: AC

## 2020-07-16 MED ORDER — ACETAMINOPHEN 325 MG PO TABS
650.0000 mg | ORAL_TABLET | Freq: Four times a day (QID) | ORAL | Status: DC | PRN
  Administered 2020-07-16: 650 mg via ORAL
  Filled 2020-07-16: qty 2

## 2020-07-16 MED ORDER — TORSEMIDE 20 MG PO TABS: 40.0000 mg | ORAL_TABLET | Freq: Every day | ORAL | 0 refills | Status: DC

## 2020-07-16 NOTE — TOC Transition Note (Addendum)
Transition of Care [facility]) - CM/SW Discharge Note   Patient Details  Name: [name] MRN: [id number] Date of Birth: 17-Sep-1945  [facility] of [facility] CM/SW Contact:  Izola Price, RN Phone Number: 07/16/2020, 12:33 PM   Clinical Narrative:  Patient to be discharged to Home/Self care today. TOC assessment and HFHS completed in ED on 07/15/20. CHF discharge/when to call MD instructions, inclusive of follow up appointments with PCP and Cardiologist in 1-2 weeks with contact numbers for each provided in AVS. Simmie Davies RN CM     Final next level of care: Home/Self Care Barriers to Discharge: Barriers Resolved   Patient Goals and CMS Choice Patient states their goals for this hospitalization and ongoing recovery are:: "to get much better and get up and moving"      Discharge Placement                       Discharge Plan and Services In-house Referral: Clinical Social Work              DME Arranged: N/A DME Agency: NA       HH Arranged: NA HH Agency: NA        Social Determinants of Health (SDOH) Interventions     Readmission Risk Interventions No flowsheet data found.

## 2020-07-16 NOTE — Discharge Summary (Signed)
Physician Discharge Summary  [name] EXH:[id number] DOB: Sep 26, 1945 DOA: 07/14/2020  PCP: Adin Hector, MD  Admit date: 07/14/2020 Discharge date: 07/16/2020  Admitted From: Home Disposition: Home  Recommendations for Outpatient Follow-up:  1. Follow up with PCP in 1-2 weeks 2. Follow-up with cardiology 3. Please obtain BMP/CBC in one week 4. Please follow up on the following pending results: None  Home Health: No Equipment/Devices: Rolling walker, home oxygen Discharge Condition: Stable CODE STATUS: Full Diet recommendation: Heart Healthy   Brief/Interim Summary: Mckenzie Hartmann [name] a 75 y.o.Caucasian femalewith medical history significant fordyslipidemia, hypothyroidism, vitamin B12 deficiency,chronic respiratory failure on home O2 at 2 L/min,peripheral neuropathy and depression, who presented to the ER with acute onset of epistaxis. Patient has been having dyspneaand orthopnea with worseninglower extremity edema.She admits to dyspnea on exertion as well as dry cough and occasional wheezing. No fever or chills.  Patient was found to have BNP of 171, troponin negative, chest x-ray concerning for vascular congestion and small right pleural effusion.   Patient was using Lasix at home, recently switched to torsemide with no significant change in her dyspnea.  She was diuresed with IV Lasix and responded well.  Her home dose of torsemide was increased to 40 mg daily.  Patient wants to get established with Dr. Fletcher Anon, cardiology followed her during current hospitalization.  Unable to obtained repeat echo as there was no technician available over the weekend.  Patient is stable and wants to go home so echo can be arranged as an outpatient at Dr. Tyrell Antonio office.  Patient received topical tranexamic acid and Afrin.She was cauterized with silver nitrate with adequate hemostasis for her epistaxis.   Remained stable since then and no more epistaxis.  Her TSH level came back  low so her home dose of Synthroid was decreased and she needs to follow-up with her primary care provider for repeat TSH in 4 to 6 weeks.  Patient will continue rest of her home medications and follow-up with her providers.  Discharge Diagnoses:  Active Problems:   Acute CHF (congestive heart failure) [facility])   Discharge Instructions  Discharge Instructions    (HEART FAILURE PATIENTS) Call MD:  Anytime you have any of the following symptoms: 1) 3 pound weight gain in 24 hours or 5 pounds in 1 week 2) shortness of breath, with or without a dry hacking cough 3) swelling in the hands, feet or stomach 4) if you have to sleep on extra pillows at night in order to breathe.   Complete by: As directed    Diet - low sodium heart healthy   Complete by: As directed    Discharge instructions   Complete by: As directed    It was pleasure taking care of you. We made some adjustment to your medications, increase the dose of torsemide to 40 mg daily. We also decrease the dose of your thyroid medicine as your TSH was low, please follow-up with your primary care provider and have your levels checked in 4 to 6 weeks for further recommendations. Follow-up with cardiology as an outpatient and they can schedule echocardiogram as needed.   Increase activity slowly   Complete by: As directed      Allergies as of 07/16/2020      Reactions   Codeine Nausea And Vomiting, Other (See Comments)   Demerol [meperidine] Nausea And Vomiting      Medication List    TAKE these medications   Adalimumab 40 MG/0.8ML Pskt Inject 40 mg into  the skin every 14 (fourteen) days.   albuterol 108 (90 Base) MCG/ACT inhaler Commonly known as: VENTOLIN HFA Inhale 1-2 puffs into the lungs every 6 (six) hours as needed for wheezing or shortness of breath.   aspirin EC 81 MG tablet Take 81 mg by mouth daily.   atorvastatin 40 MG tablet Commonly known as: LIPITOR Take 1 tablet (40 mg total) by mouth daily at 6 PM.    azithromycin 250 MG tablet Commonly known as: ZITHROMAX Take 250 mg by mouth every Monday, Wednesday, and Friday.   clonazePAM 0.5 MG tablet Commonly known as: KLONOPIN Take 0.5 mg by mouth 2 (two) times daily as needed for anxiety.   clopidogrel 75 MG tablet Commonly known as: PLAVIX Take 1 tablet (75 mg total) by mouth daily.   folic acid 1 MG tablet Commonly known as: FOLVITE Take 1 mg by mouth daily.   ipratropium-albuterol 0.5-2.5 (3) MG/3ML Soln Commonly known as: DUONEB Inhale 3 mLs into the lungs 2 (two) times daily as needed (wheezing or shortness of breath).   lamoTRIgine 100 MG tablet Commonly known as: LAMICTAL Take 100 mg by mouth daily.   levothyroxine 112 MCG tablet Commonly known as: SYNTHROID Take 1 tablet (112 mcg total) by mouth daily. Start taking on: July 17, 2020 What changed:   medication strength  how much to take   methotrexate 2.5 MG tablet Commonly known as: RHEUMATREX Take 20 mg by mouth every Saturday.   mirtazapine 15 MG tablet Commonly known as: REMERON Take 15 mg by mouth at bedtime.   pantoprazole 40 MG tablet Commonly known as: PROTONIX Take 40 mg by mouth.   potassium chloride 8 MEQ tablet Commonly known as: KLOR-CON Take 8 mEq by mouth daily.   predniSONE 5 MG tablet Commonly known as: DELTASONE Take 2.5 mg by mouth daily.   torsemide 20 MG tablet Commonly known as: DEMADEX Take 2 tablets (40 mg total) by mouth daily. What changed: how much to take   venlafaxine XR 150 MG 24 hr capsule Commonly known as: EFFEXOR-XR Take 150 mg by mouth daily.   Vitamin D (Ergocalciferol) 1.25 MG (50000 UNIT) Caps capsule Commonly known as: DRISDOL Take 50,000 Int'l Units by mouth every 7 (seven) days.       Follow-up Information    Adin Hector, MD. Schedule an appointment as soon as possible for a visit.   Specialty: Internal Medicine Contact information: [place]  [phone]        Wellington Hampshire, MD. Schedule an appointment as soon as possible for a visit.   Specialty: Cardiology Contact information: [address] [place] [address] [phone]              Allergies  Allergen Reactions  . Codeine Nausea And Vomiting and Other (See Comments)  . Demerol [Meperidine] Nausea And Vomiting    Consultations:  Cardiology  Procedures/Studies: DG Chest Portable 1 View  Result Date: 07/14/2020 CLINICAL DATA:  Shortness of breath EXAM: PORTABLE CHEST 1 VIEW COMPARISON:  08/28/2005 FINDINGS: Cardiac shadow is within normal limits. Mild aortic calcifications are seen. Lungs are well aerated bilaterally with small right-sided pleural effusion. Mild vascular congestion is noted without interstitial edema. No acute bony abnormality is noted. IMPRESSION: Mild vascular congestion and small right pleural effusion. Electronically Signed   By: Inez Catalina M.D.   On: 07/14/2020 23:29     Subjective: Patient was seen and examined today.  No new  complaint.  No chest pain or shortness of breath.  Lower extremity edema improving.  Would like to go home as today's Easter. She will follow-up with Dr. Fletcher Anon in his clinic and echocardiogram can be arranged as an outpatient if needed.  Discharge Exam: Vitals:   07/16/20 0400 07/16/20 0755  BP: (!) 143/80 132/61  Pulse: 75 82  Resp: 16 18  Temp: 98 F (36.7 C) 98.2 F (36.8 C)  SpO2: 95% 92%   Vitals:   07/15/20 1631 07/15/20 1824 07/16/20 0400 07/16/20 0755  BP: (!) 141/62 (!) 150/63 (!) 143/80 132/61  Pulse: 65 80 75 82  Resp: 16 16 16 18   Temp: 97.7 F (36.5 C) 97.8 F (36.6 C) 98 F (36.7 C) 98.2 F (36.8 C)  TempSrc: Oral Oral Oral Oral  SpO2: 94% 95% 95% 92%  Weight:      Height:        General: Pt is alert, awake, not in acute distress Cardiovascular: RRR, S1/S2 +, no rubs, no gallops Respiratory: CTA bilaterally, no wheezing, no rhonchi Abdominal:  Soft, NT, ND, bowel sounds + Extremities: 1+ LE edema, no cyanosis   The results of significant diagnostics from this hospitalization (including imaging, microbiology, ancillary and laboratory) are listed below for reference.    Microbiology: Recent Results (from the past 240 hour(s))  Resp Panel by RT-PCR (Flu A&B, Covid) Nasopharyngeal Swab     Status: None   Collection Time: 07/15/20  3:06 AM   Specimen: Nasopharyngeal Swab; Nasopharyngeal(NP) swabs in vial transport medium  Result Value Ref Range Status   SARS Coronavirus 2 by RT PCR NEGATIVE NEGATIVE Final    Comment: (NOTE) SARS-CoV-2 target nucleic acids are NOT DETECTED.  The SARS-CoV-2 RNA is generally detectable in upper respiratory specimens during the acute phase of infection. The lowest concentration of SARS-CoV-2 viral copies this assay can detect is 138 copies/mL. A negative result does not preclude SARS-Cov-2 infection and should not be used as the sole basis for treatment or other patient management decisions. A negative result may occur with  improper specimen collection/handling, submission of specimen other than nasopharyngeal swab, presence of viral mutation(s) within the areas targeted by this assay, and inadequate number of viral copies(<138 copies/mL). A negative result must be combined with clinical observations, patient history, and epidemiological information. The expected result is Negative.  Fact Sheet for Patients:  EntrepreneurPulse.com.au  Fact Sheet for Healthcare Providers:  IncredibleEmployment.be  This test is no t yet approved or cleared by the [place] FDA and  has been authorized for detection and/or diagnosis of SARS-CoV-2 by FDA under an Emergency Use Authorization (EUA). This EUA will remain  in effect (meaning this test can be used) for the duration of the COVID-19 declaration under Section 564(b)(1) of the Act, 21 U.S.C.section 360bbb-3(b)(1),  unless the authorization is terminated  or revoked sooner.       Influenza A by PCR NEGATIVE NEGATIVE Final   Influenza B by PCR NEGATIVE NEGATIVE Final    Comment: (NOTE) The Xpert Xpress SARS-CoV-2/FLU/RSV plus assay is intended as an aid in the diagnosis of influenza from Nasopharyngeal swab specimens and should not be used as a sole basis for treatment. Nasal washings and aspirates are unacceptable for Xpert Xpress SARS-CoV-2/FLU/RSV testing.  Fact Sheet for Patients: EntrepreneurPulse.com.au  Fact Sheet for Healthcare Providers: IncredibleEmployment.be  This test is not yet approved or cleared by the [place] FDA and has been authorized for detection and/or diagnosis of SARS-CoV-2 by FDA under an Emergency  Use Authorization (EUA). This EUA will remain in effect (meaning this test can be used) for the duration of the COVID-19 declaration under Section 564(b)(1) of the Act, 21 U.S.C. section 360bbb-3(b)(1), unless the authorization is terminated or revoked.  Performed at [facility], [address] [place], [place] [address]      Labs: BNP (last 3 results) Recent Labs    07/14/20 2329  BNP 562.1*   Basic Metabolic Panel: Recent Labs  Lab 07/14/20 2329 07/15/20 0607 07/16/20 0743  NA 141 142 141  K 3.7 3.2* 3.6  CL 108 105 100  CO2 25 30 28   GLUCOSE 164* 114* 105*  BUN 19 17 18   CREATININE 0.80 0.83 0.97  CALCIUM 8.7* 8.7* 9.1   Liver Function Tests: Recent Labs  Lab 07/14/20 2329  AST 20  ALT 17  ALKPHOS 86  BILITOT 0.6  PROT 6.3*  ALBUMIN 3.0*   No results for input(s): LIPASE, AMYLASE in the last 168 hours. No results for input(s): AMMONIA in the last 168 hours. CBC: Recent Labs  Lab 07/14/20 2329 07/15/20 0607  WBC 4.6 5.7  HGB 10.9* 11.5*  HCT 33.7* 34.9*  MCV 100.9* 100.6*  PLT 202 217   Cardiac Enzymes: No results for input(s): CKTOTAL, CKMB, CKMBINDEX, TROPONINI in the last  168 hours. BNP: Invalid input(s): POCBNP CBG: No results for input(s): GLUCAP in the last 168 hours. D-Dimer No results for input(s): DDIMER in the last 72 hours. Hgb A1c No results for input(s): HGBA1C in the last 72 hours. Lipid Profile No results for input(s): CHOL, HDL, LDLCALC, TRIG, CHOLHDL, LDLDIRECT in the last 72 hours. Thyroid function studies Recent Labs    07/15/20 0607  TSH 0.046*   Anemia work up No results for input(s): VITAMINB12, FOLATE, FERRITIN, TIBC, IRON, RETICCTPCT in the last 72 hours. Urinalysis No results found for: COLORURINE, APPEARANCEUR, [place], [place], GLUCOSEU, [place], [place], [place], PROTEINUR, UROBILINOGEN, NITRITE, LEUKOCYTESUR Sepsis Labs Invalid input(s): PROCALCITONIN,  WBC,  LACTICIDVEN Microbiology Recent Results (from the past 240 hour(s))  Resp Panel by RT-PCR (Flu A&B, Covid) Nasopharyngeal Swab     Status: None   Collection Time: 07/15/20  3:06 AM   Specimen: Nasopharyngeal Swab; Nasopharyngeal(NP) swabs in vial transport medium  Result Value Ref Range Status   SARS Coronavirus 2 by RT PCR NEGATIVE NEGATIVE Final    Comment: (NOTE) SARS-CoV-2 target nucleic acids are NOT DETECTED.  The SARS-CoV-2 RNA is generally detectable in upper respiratory specimens during the acute phase of infection. The lowest concentration of SARS-CoV-2 viral copies this assay can detect is 138 copies/mL. A negative result does not preclude SARS-Cov-2 infection and should not be used as the sole basis for treatment or other patient management decisions. A negative result may occur with  improper specimen collection/handling, submission of specimen other than nasopharyngeal swab, presence of viral mutation(s) within the areas targeted by this assay, and inadequate number of viral copies(<138 copies/mL). A negative result must be combined with clinical observations, patient history, and epidemiological information. The expected result is  Negative.  Fact Sheet for Patients:  EntrepreneurPulse.com.au  Fact Sheet for Healthcare Providers:  IncredibleEmployment.be  This test is no t yet approved or cleared by the [place] FDA and  has been authorized for detection and/or diagnosis of SARS-CoV-2 by FDA under an Emergency Use Authorization (EUA). This EUA will remain  in effect (meaning this test can be used) for the duration of the COVID-19 declaration under Section 564(b)(1) of the Act, 21 U.S.C.section 360bbb-3(b)(1),  unless the authorization is terminated  or revoked sooner.       Influenza A by PCR NEGATIVE NEGATIVE Final   Influenza B by PCR NEGATIVE NEGATIVE Final    Comment: (NOTE) The Xpert Xpress SARS-CoV-2/FLU/RSV plus assay is intended as an aid in the diagnosis of influenza from Nasopharyngeal swab specimens and should not be used as a sole basis for treatment. Nasal washings and aspirates are unacceptable for Xpert Xpress SARS-CoV-2/FLU/RSV testing.  Fact Sheet for Patients: EntrepreneurPulse.com.au  Fact Sheet for Healthcare Providers: IncredibleEmployment.be  This test is not yet approved or cleared by the [place] FDA and has been authorized for detection and/or diagnosis of SARS-CoV-2 by FDA under an Emergency Use Authorization (EUA). This EUA will remain in effect (meaning this test can be used) for the duration of the COVID-19 declaration under Section 564(b)(1) of the Act, 21 U.S.C. section 360bbb-3(b)(1), unless the authorization is terminated or revoked.  Performed at [facility], [address] [place], [place] [address]     Time coordinating discharge: Over 30 minutes  SIGNED:  Lorella Nimrod, MD  [facility] 07/16/2020, 10:55 AM  If 7PM-7AM, please contact night-coverage [email or web address]  This record has been created using Systems analyst. Errors have been sought  and corrected,but may not always be located. Such creation errors do not reflect on the standard of care.

## 2020-07-17 ENCOUNTER — Ambulatory Visit
Admission: RE | Admit: 2020-07-17 | Discharge: 2020-07-17 | Disposition: A | Discharge status: Home / Self Care | Payer: PPO | Source: Ambulatory Visit | Attending: Internal Medicine | Admitting: Internal Medicine

## 2020-07-17 ENCOUNTER — Other Ambulatory Visit: Payer: Self-pay

## 2020-07-17 DIAGNOSIS — R1900 Intra-abdominal and pelvic swelling, mass and lump, unspecified site: Secondary | ICD-10-CM

## 2020-07-17 DIAGNOSIS — M47816 Spondylosis without myelopathy or radiculopathy, lumbar region: Secondary | ICD-10-CM | POA: Diagnosis not present

## 2020-07-17 DIAGNOSIS — R1909 Other intra-abdominal and pelvic swelling, mass and lump: Secondary | ICD-10-CM | POA: Diagnosis not present

## 2020-07-17 DIAGNOSIS — J8410 Pulmonary fibrosis, unspecified: Secondary | ICD-10-CM | POA: Diagnosis not present

## 2020-07-17 DIAGNOSIS — R058 Other specified cough: Secondary | ICD-10-CM | POA: Diagnosis not present

## 2020-07-17 DIAGNOSIS — Z03818 Encounter for observation for suspected exposure to other biological agents ruled out: Secondary | ICD-10-CM | POA: Diagnosis not present

## 2020-07-17 DIAGNOSIS — K4090 Unilateral inguinal hernia, without obstruction or gangrene, not specified as recurrent: Secondary | ICD-10-CM | POA: Diagnosis not present

## 2020-07-17 DIAGNOSIS — J9611 Chronic respiratory failure with hypoxia: Secondary | ICD-10-CM | POA: Diagnosis not present

## 2020-07-17 DIAGNOSIS — R110 Nausea: Secondary | ICD-10-CM | POA: Diagnosis not present

## 2020-07-17 DIAGNOSIS — K5730 Diverticulosis of large intestine without perforation or abscess without bleeding: Secondary | ICD-10-CM | POA: Diagnosis not present

## 2020-07-17 DIAGNOSIS — I509 Heart failure, unspecified: Secondary | ICD-10-CM | POA: Diagnosis not present

## 2020-07-17 DIAGNOSIS — E7849 Other hyperlipidemia: Secondary | ICD-10-CM

## 2020-07-17 HISTORY — DX: Systemic involvement of connective tissue, unspecified: M35.9

## 2020-07-17 LAB — POCT I-STAT CREATININE: Creatinine, Ser: 1.1 mg/dL — ABNORMAL HIGH (ref 0.44–1.00)

## 2020-07-17 MED ORDER — IOHEXOL 300 MG/ML  SOLN
100.0000 mL | Freq: Once | INTRAMUSCULAR | Status: AC | PRN
  Administered 2020-07-17: 100 mL via INTRAVENOUS

## 2020-07-21 ENCOUNTER — Ambulatory Visit (INDEPENDENT_AMBULATORY_CARE_PROVIDER_SITE_OTHER): Payer: PPO | Admitting: Cardiology

## 2020-07-21 ENCOUNTER — Other Ambulatory Visit: Payer: Self-pay

## 2020-07-21 ENCOUNTER — Encounter: Payer: Self-pay | Admitting: Cardiology

## 2020-07-21 VITALS — BP 140/94 | HR 87 | Ht 64.0 in | Wt 171.0 lb

## 2020-07-21 DIAGNOSIS — E7800 Pure hypercholesterolemia, unspecified: Secondary | ICD-10-CM | POA: Diagnosis not present

## 2020-07-21 DIAGNOSIS — I509 Heart failure, unspecified: Secondary | ICD-10-CM | POA: Diagnosis not present

## 2020-07-21 DIAGNOSIS — I6529 Occlusion and stenosis of unspecified carotid artery: Secondary | ICD-10-CM

## 2020-07-21 NOTE — Patient Instructions (Signed)
Medication Instructions:  Your physician recommends that you continue on your current medications as directed. Please refer to the Current Medication list given to you today.  *If you need a refill on your cardiac medications before your next appointment, please call your pharmacy*   Lab Work:  BMP to be drawn today  If you have labs (blood work) drawn today and your tests are completely normal, you will receive your results only by: Marland Kitchen MyChart Message (if you have MyChart) OR . A paper copy in the mail If you have any lab test that is abnormal or we need to change your treatment, we will call you to review the results.   Testing/Procedures:  Your physician has requested that you have an echocardiogram. Echocardiography is a painless test that uses sound waves to create images of your heart. It provides your doctor with information about the size and shape of your heart and how well your heart's chambers and valves are working. This procedure takes approximately one hour. There are no restrictions for this procedure.    Follow-Up: At [facility], you and your health needs are our priority.  As part of our continuing mission to provide you with exceptional heart care, we have created designated Provider Care Teams.  These Care Teams include your primary Cardiologist (physician) and Advanced Practice Providers (APPs -  Physician Assistants and Nurse Practitioners) who all work together to provide you with the care you need, when you need it.  We recommend signing up for the patient portal called "MyChart".  Sign up information is provided on this After Visit Summary.  MyChart is used to connect with patients for Virtual Visits (Telemedicine).  Patients are able to view lab/test results, encounter notes, upcoming appointments, etc.  Non-urgent messages can be sent to your provider as well.   To learn more about what you can do with MyChart, go to [email or web address].    Your next  appointment:   Follow up after Echo   The format for your next appointment:   In Person  Provider:   Kate Sable, MD   Other Instructions

## 2020-07-21 NOTE — Progress Notes (Signed)
Cardiology Office Note:    Date:  07/21/2020   ID:  Breslin, Burklow 05/29/1945, MRN [id number]  PCP:  Adin Hector, MD   [place]  Cardiologist:  No primary care provider on file.  Advanced Practice Provider:  No care team member to display Electrophysiologist:  None       Referring MD: Adin Hector, MD   Chief Complaint  Patient presents with  . Follow-up    Recent hospital visit    History of Present Illness:    [name] is a 75 y.o. female with a hx of hyperlipidemia, bilateral nonobstructive carotid artery stenosis, pulmonary fibrosis, former smoker x40+ years, COPD on 2 L of oxygen, who presents for follow-up.  Recently seen in the hospital due to shortness of breath and edema.  Chest x-ray showed vascular congestion and pleural effusion, diagnosed with new onset CHF, managed with IV Lasix with improvement in symptoms.  Troponins were normal, denies anginal symptoms.  Home torsemide was increased to 40 mg daily.  Outpatient echo advised.  Echo performed at [facility] in 2020 showed EF 55%, mild LVH.  Diastolic function not described.  Edema has overall improved compared to admission.  Past Medical History:  Diagnosis Date  . Collagen vascular disease ([place])   . Depression   . Hyperlipidemia   . Muscle pain   . Neuropathy   . RA (rheumatoid arthritis) ([place])   . Thyroid disease   . Vitamin B 12 deficiency     Past Surgical History:  Procedure Laterality Date  . ABDOMINAL HYSTERECTOMY    . BREAST BIOPSY Left    2 benign biopsies  . ECTOPIC PREGNANCY SURGERY    . KNEE SURGERY Right   . LAMINECTOMY    . leg vein stripping Bilateral   . TONSILLECTOMY      Current Medications: Current Meds  Medication Sig  . Adalimumab 40 MG/0.8ML PSKT Inject 40 mg into the skin every 14 (fourteen) days.   Marland Kitchen albuterol (VENTOLIN HFA) 108 (90 Base) MCG/ACT inhaler Inhale 1-2 puffs into the lungs every 6 (six) hours as needed for wheezing  or shortness of breath.  Marland Kitchen aspirin EC 81 MG tablet Take 81 mg by mouth daily.   Marland Kitchen atorvastatin (LIPITOR) 40 MG tablet Take 1 tablet (40 mg total) by mouth daily at 6 PM.  . azithromycin (ZITHROMAX) 250 MG tablet Take 250 mg by mouth every Monday, Wednesday, and Friday.  . clonazePAM (KLONOPIN) 0.5 MG tablet Take 0.5 mg by mouth 2 (two) times daily as needed for anxiety.  . clopidogrel (PLAVIX) 75 MG tablet Take 1 tablet (75 mg total) by mouth daily.  . folic acid (FOLVITE) 1 MG tablet Take 1 mg by mouth daily.  Marland Kitchen ipratropium-albuterol (DUONEB) 0.5-2.5 (3) MG/3ML SOLN Inhale 3 mLs into the lungs 2 (two) times daily as needed (wheezing or shortness of breath).  . lamoTRIgine (LAMICTAL) 100 MG tablet Take 100 mg by mouth daily.  Marland Kitchen levothyroxine (SYNTHROID) 112 MCG tablet Take 1 tablet (112 mcg total) by mouth daily.  . methotrexate (RHEUMATREX) 2.5 MG tablet Take 20 mg by mouth every Saturday.  . mirtazapine (REMERON) 15 MG tablet Take 15 mg by mouth at bedtime.  . pantoprazole (PROTONIX) 40 MG tablet Take 40 mg by mouth.  . potassium chloride (KLOR-CON) 8 MEQ tablet Take 8 mEq by mouth daily.  . predniSONE (DELTASONE) 5 MG tablet Take 2.5 mg by mouth daily.  Marland Kitchen torsemide (DEMADEX)  20 MG tablet Take 2 tablets (40 mg total) by mouth daily.  Marland Kitchen venlafaxine XR (EFFEXOR-XR) 150 MG 24 hr capsule Take 150 mg by mouth daily.  . Vitamin D, Ergocalciferol, (DRISDOL) 1.25 MG (50000 UNIT) CAPS capsule Take 50,000 Int'l Units by mouth every 7 (seven) days.     Allergies:   Codeine and Demerol [meperidine]   Social History   Socioeconomic History  . Marital status: Married    Spouse name: Not on file  . Number of children: Not on file  . Years of education: Not on file  . Highest education level: Not on file  Occupational History  . Not on file  Tobacco Use  . Smoking status: Never Smoker  . Smokeless tobacco: Never Used  Substance and Sexual Activity  . Alcohol use: Not on file  . Drug use: Not  on file  . Sexual activity: Not on file  Other Topics Concern  . Not on file  Social History Narrative  . Not on file   Social Determinants of Health   Financial Resource Strain: Not on file  Food Insecurity: Not on file  Transportation Needs: Not on file  Physical Activity: Not on file  Stress: Not on file  Social Connections: Not on file     Family History: The patient's family history includes Breast cancer in her cousin, maternal aunt, and maternal aunt; COPD in her father; Cancer in her father.  ROS:   Please see the history of present illness.     All other systems reviewed and are negative.  EKGs/Labs/Other Studies Reviewed:    The following studies were reviewed today:   EKG:  EKG not  ordered today.    Recent Labs: 07/14/2020: ALT 17; B Natriuretic Peptide 171.8 07/15/2020: Hemoglobin 11.5; Platelets 217; TSH 0.046 07/16/2020: BUN 18; Potassium 3.6; Sodium 141 07/17/2020: Creatinine, Ser 1.10  Recent Lipid Panel    Component Value Date/Time   CHOL 212 (H) 07/18/2016 0544   TRIG 110 07/18/2016 0544   HDL 49 07/18/2016 0544   CHOLHDL 4.3 07/18/2016 0544   VLDL 22 07/18/2016 0544   LDLCALC 141 (H) 07/18/2016 0544     Risk Assessment/Calculations:      Physical Exam:    VS:  BP (!) 140/94   Pulse 87   Ht 5\' 4"  (1.626 m)   Wt 171 lb (77.6 kg)   BMI 29.35 kg/m     Wt Readings from Last 3 Encounters:  07/21/20 171 lb (77.6 kg)  07/14/20 172 lb (78 kg)  07/19/16 161 lb 4.8 oz (73.2 kg)     GEN:  Well nourished, well developed in no acute distress HEENT: Normal NECK: No JVD; No carotid bruits LYMPHATICS: No lymphadenopathy CARDIAC: RRR, no murmurs, rubs, gallops RESPIRATORY: Poor inspiratory effort, diminished breath sounds at bases, worse on left ABDOMEN: Soft, non-tender, non-distended MUSCULOSKELETAL: Trace edema; ulnar deviation secondary to RA noted SKIN: Warm and dry NEUROLOGIC:  Alert and oriented x 3 PSYCHIATRIC:  Normal affect    ASSESSMENT:    1. Other congestive heart failure (HCC)   2. Pure hypercholesterolemia   3. Stenosis of carotid artery, unspecified laterality    PLAN:    In order of problems listed above:  1. Patient with history of shortness of breath and recent admission with acute CHF.  Previous echo in 2020 showing preserved ejection fraction, diastolic function not mentioned.  Patient likely has diastolic dysfunction.  Repeat echocardiogram to evaluate any significant abnormalities.  Denies anginal symptoms.  Continue torsemide 40 mg daily.  Check BMP today. 2. Hyperlipidemia, on Lipitor. 3. Mild to moderate carotid artery stenosis bilaterally, Plavix, Lipitor.  Follow-up after echocardiogram    Medication Adjustments/Labs and Tests Ordered: Current medicines are reviewed at length with the patient today.  Concerns regarding medicines are outlined above.  Orders Placed This Encounter  Procedures  . Basic metabolic panel  . ECHOCARDIOGRAM COMPLETE   No orders of the defined types were placed in this encounter.   Patient Instructions  Medication Instructions:  Your physician recommends that you continue on your current medications as directed. Please refer to the Current Medication list given to you today.  *If you need a refill on your cardiac medications before your next appointment, please call your pharmacy*   Lab Work:  BMP to be drawn today  If you have labs (blood work) drawn today and your tests are completely normal, you will receive your results only by: Marland Kitchen MyChart Message (if you have MyChart) OR . A paper copy in the mail If you have any lab test that is abnormal or we need to change your treatment, we will call you to review the results.   Testing/Procedures:  Your physician has requested that you have an echocardiogram. Echocardiography is a painless test that uses sound waves to create images of your heart. It provides your doctor with information about the size  and shape of your heart and how well your heart's chambers and valves are working. This procedure takes approximately one hour. There are no restrictions for this procedure.    Follow-Up: At [facility], you and your health needs are our priority.  As part of our continuing mission to provide you with exceptional heart care, we have created designated Provider Care Teams.  These Care Teams include your primary Cardiologist (physician) and Advanced Practice Providers (APPs -  Physician Assistants and Nurse Practitioners) who all work together to provide you with the care you need, when you need it.  We recommend signing up for the patient portal called "MyChart".  Sign up information is provided on this After Visit Summary.  MyChart is used to connect with patients for Virtual Visits (Telemedicine).  Patients are able to view lab/test results, encounter notes, upcoming appointments, etc.  Non-urgent messages can be sent to your provider as well.   To learn more about what you can do with MyChart, go to [email or web address].    Your next appointment:   Follow up after Echo   The format for your next appointment:   In Person  Provider:   Kate Sable, MD   Other Instructions      Signed, Kate Sable, MD  07/21/2020 5:04 PM    [place]

## 2020-07-22 LAB — BASIC METABOLIC PANEL
BUN/Creatinine Ratio: 26 (ref 12–28)
BUN: 30 mg/dL — ABNORMAL HIGH (ref 8–27)
CO2: 25 mmol/L (ref 20–29)
Calcium: 9.1 mg/dL (ref 8.7–10.3)
Chloride: 100 mmol/L (ref 96–106)
Creatinine, Ser: 1.17 mg/dL — ABNORMAL HIGH (ref 0.57–1.00)
Glucose: 102 mg/dL — ABNORMAL HIGH (ref 65–99)
Potassium: 3.8 mmol/L (ref 3.5–5.2)
Sodium: 143 mmol/L (ref 134–144)
eGFR: 48 mL/min/{1.73_m2} — ABNORMAL LOW (ref >59)

## 2020-07-24 ENCOUNTER — Other Ambulatory Visit: Payer: Self-pay | Admitting: Internal Medicine

## 2020-07-24 DIAGNOSIS — Z1231 Encounter for screening mammogram for malignant neoplasm of breast: Secondary | ICD-10-CM

## 2020-07-27 ENCOUNTER — Telehealth: Payer: Self-pay

## 2020-07-27 DIAGNOSIS — I509 Heart failure, unspecified: Secondary | ICD-10-CM

## 2020-07-27 NOTE — Telephone Encounter (Signed)
Called patient and gave her BMP result note. Ordered BMP for 3 weeks as requested. Patient verbalized understanding and agreed with plan.

## 2020-07-27 NOTE — Telephone Encounter (Signed)
-----   Message from Kate Sable, MD sent at 07/26/2020  5:33 PM EDT ----- Creatinine stable, continue torsemide as prescribed.  Repeat BMP in 3 weeks.  Thank you

## 2020-08-03 DIAGNOSIS — Z79899 Other long term (current) drug therapy: Secondary | ICD-10-CM | POA: Diagnosis not present

## 2020-08-03 DIAGNOSIS — R0602 Shortness of breath: Secondary | ICD-10-CM | POA: Diagnosis not present

## 2020-08-03 DIAGNOSIS — M0579 Rheumatoid arthritis with rheumatoid factor of multiple sites without organ or systems involvement: Secondary | ICD-10-CM | POA: Diagnosis not present

## 2020-08-03 DIAGNOSIS — E034 Atrophy of thyroid (acquired): Secondary | ICD-10-CM | POA: Diagnosis not present

## 2020-08-08 DIAGNOSIS — J45901 Unspecified asthma with (acute) exacerbation: Secondary | ICD-10-CM | POA: Diagnosis not present

## 2020-08-10 ENCOUNTER — Inpatient Hospital Stay: Admission: RE | Admit: 2020-08-10 | Payer: PPO | Source: Ambulatory Visit

## 2020-08-13 ENCOUNTER — Telehealth: Payer: Self-pay | Admitting: Physician Assistant

## 2020-08-13 NOTE — Telephone Encounter (Signed)
Pt called stating her feet and ankles are swollen more than usual and she has a 4lb weight gain overnight. She takes 40 mg lasix daily. She has taken lasix this morning and is now urinating. She is reducing fluid intake. She had steak and shrimp from [place] last night. When I suggested to avoid restaurant food, she insisted she has been mindful of sodium. I increased her lasix to 60 mg today and tomorrow. She denies increased O2 requirements and SOB. We reviewed ER precautions. She is very upset that her echo is scheduled so far out.

## 2020-08-14 ENCOUNTER — Telehealth: Payer: Self-pay | Admitting: Cardiology

## 2020-08-14 NOTE — Telephone Encounter (Signed)
Called patient and spoke with her. She stated the weight has come back down to her normal since doing the Lasix 60 mg this morning and yesterday. She was most concerned about getting her Echo done, and upset she has to wait until 08/29/20. I informed her that the only other option is to have it at the hospital but that would cost between $5,000-$6,000. I informed her I would put her on the wait list in case something opened up. I advised her to weight every morning and let us know if she is up 2-3lbs in 24 hours or 5lbs in a week. Patient verbalized understanding and agreed with plan.

## 2020-08-14 NOTE — Telephone Encounter (Signed)
Pt c/o swelling: STAT is pt has developed SOB within 24 hours  1) How much weight have you gained and in what time span? Up 4lbs yesterday & down 4.5lbs today  2) If swelling, where is the swelling located? Feet, ankles & legs  3) Are you currently taking a fluid pill? lasix 60mg   4) Are you currently SOB? On oxygen 2liters  5) Do you have a log of your daily weights (if so, list)? yes  6) Have you gained 3 pounds in a day or 5 pounds in a week? Yes  7) Have you traveled recently? No

## 2020-08-29 ENCOUNTER — Other Ambulatory Visit: Payer: Self-pay

## 2020-08-29 ENCOUNTER — Ambulatory Visit (INDEPENDENT_AMBULATORY_CARE_PROVIDER_SITE_OTHER): Payer: PPO

## 2020-08-29 DIAGNOSIS — I509 Heart failure, unspecified: Secondary | ICD-10-CM

## 2020-08-29 LAB — ECHOCARDIOGRAM COMPLETE
AR max vel: 2.91 cm2
AV Area VTI: 2.84 cm2
AV Area mean vel: 2.69 cm2
AV Mean grad: 3 mmHg
AV Peak grad: 5.2 mmHg
Ao pk vel: 1.14 m/s
Area-P 1/2: 2.37 cm2
Calc EF: 50.6 %
S' Lateral: 3 cm
Single Plane A2C EF: 48.9 %
Single Plane A4C EF: 53.3 %

## 2020-08-31 DIAGNOSIS — Z8673 Personal history of transient ischemic attack (TIA), and cerebral infarction without residual deficits: Secondary | ICD-10-CM | POA: Diagnosis not present

## 2020-08-31 DIAGNOSIS — R471 Dysarthria and anarthria: Secondary | ICD-10-CM | POA: Insufficient documentation

## 2020-08-31 DIAGNOSIS — R413 Other amnesia: Secondary | ICD-10-CM | POA: Diagnosis not present

## 2020-08-31 DIAGNOSIS — R296 Repeated falls: Secondary | ICD-10-CM | POA: Insufficient documentation

## 2020-09-01 ENCOUNTER — Other Ambulatory Visit: Payer: Self-pay | Admitting: Physician Assistant

## 2020-09-01 DIAGNOSIS — Z8673 Personal history of transient ischemic attack (TIA), and cerebral infarction without residual deficits: Secondary | ICD-10-CM

## 2020-09-04 ENCOUNTER — Other Ambulatory Visit: Payer: Self-pay

## 2020-09-04 ENCOUNTER — Ambulatory Visit: Payer: PPO | Admitting: Cardiology

## 2020-09-04 ENCOUNTER — Encounter: Payer: Self-pay | Admitting: Cardiology

## 2020-09-04 VITALS — BP 124/74 | HR 76 | Ht 64.0 in | Wt 172.0 lb

## 2020-09-04 DIAGNOSIS — R0600 Dyspnea, unspecified: Secondary | ICD-10-CM

## 2020-09-04 DIAGNOSIS — R0609 Other forms of dyspnea: Secondary | ICD-10-CM

## 2020-09-04 DIAGNOSIS — E7800 Pure hypercholesterolemia, unspecified: Secondary | ICD-10-CM | POA: Diagnosis not present

## 2020-09-04 DIAGNOSIS — I6529 Occlusion and stenosis of unspecified carotid artery: Secondary | ICD-10-CM

## 2020-09-04 MED ORDER — TORSEMIDE 20 MG PO TABS: 40.0000 mg | ORAL_TABLET | Freq: Every day | ORAL | 2 refills | Status: DC

## 2020-09-04 NOTE — Patient Instructions (Addendum)
Medication Instructions:  - Your physician recommends that you continue on your current medications as directed. Please refer to the Current Medication list given to you today.  - Refills have been sent to your pharmacy for torsemide  *If you need a refill on your cardiac medications before your next appointment, please call your pharmacy*   Lab Work: - none ordered  If you have labs (blood work) drawn today and your tests are completely normal, you will receive your results only by: Marland Kitchen MyChart Message (if you have MyChart) OR . A paper copy in the mail If you have any lab test that is abnormal or we need to change your treatment, we will call you to review the results.   Testing/Procedures: - none ordered   Follow-Up: At [facility], you and your health needs are our priority.  As part of our continuing mission to provide you with exceptional heart care, we have created designated Provider Care Teams.  These Care Teams include your primary Cardiologist (physician) and Advanced Practice Providers (APPs -  Physician Assistants and Nurse Practitioners) who all work together to provide you with the care you need, when you need it.  We recommend signing up for the patient portal called "MyChart".  Sign up information is provided on this After Visit Summary.  MyChart is used to connect with patients for Virtual Visits (Telemedicine).  Patients are able to view lab/test results, encounter notes, upcoming appointments, etc.  Non-urgent messages can be sent to your provider as well.   To learn more about what you can do with MyChart, go to [email or web address].    Your next appointment:   6 month(s)  The format for your next appointment:   In Person  Provider:   You may see Kate Sable, MD or one of the following Advanced Practice Providers on your designated Care Team:    Murray Hodgkins, NP  Christell Faith, PA-C  Marrianne Mood, PA-C  [place], [place]  Laurann Montana, NP    Other Instructions n/a

## 2020-09-04 NOTE — Progress Notes (Signed)
Cardiology Office Note:    Date:  09/04/2020   ID:  [name], DOB 11-30-1945, MRN [id number]  PCP:  Adin Hector, MD   [place]  Cardiologist:  None  Advanced Practice Provider:  No care team member to display Electrophysiologist:  None       Referring MD: Adin Hector, MD   Chief Complaint  Patient presents with  . Other    Follow up post ECHO. Meds reviewed verbally with patient.     History of Present Illness:    [name] is a 75 y.o. female with a hx of hyperlipidemia, bilateral nonobstructive carotid artery stenosis, pulmonary fibrosis, former smoker x40+ years, COPD on 2 L of oxygen, who presents for follow-up.  Previously seen for shortness of breath, recent hospital admission for acute CHF symptoms.  Placed on torsemide 40 mg daily with good effect.  States having more swelling than usual, increased dose of torsemide to 60 mg for 2 days with resolution of symptoms.  Currently tolerating torsemide 40 mg daily.  Still has shortness of breath on exertion.  Follows up with rheumatology and pulmonary medicine due to significant rheumatoid arthritis and pulmonary fibrosis respectively.  Repeat echocardiogram was ordered.  Prior notes Echo performed at [facility] in 2020 showed EF 55%, mild LVH.  Past Medical History:  Diagnosis Date  . Collagen vascular disease ([place])   . Depression   . Hyperlipidemia   . Muscle pain   . Neuropathy   . RA (rheumatoid arthritis) ([place])   . Thyroid disease   . Vitamin B 12 deficiency     Past Surgical History:  Procedure Laterality Date  . ABDOMINAL HYSTERECTOMY    . BREAST BIOPSY Left    2 benign biopsies  . ECTOPIC PREGNANCY SURGERY    . KNEE SURGERY Right   . LAMINECTOMY    . leg vein stripping Bilateral   . TONSILLECTOMY      Current Medications: Current Meds  Medication Sig  . Adalimumab 40 MG/0.8ML PSKT Inject 40 mg into the skin every 14 (fourteen) days.   Marland Kitchen albuterol (VENTOLIN  HFA) 108 (90 Base) MCG/ACT inhaler Inhale 1-2 puffs into the lungs every 6 (six) hours as needed for wheezing or shortness of breath.  Marland Kitchen aspirin EC 81 MG tablet Take 81 mg by mouth daily.   Marland Kitchen atorvastatin (LIPITOR) 40 MG tablet Take 1 tablet (40 mg total) by mouth daily at 6 PM.  . azithromycin (ZITHROMAX) 250 MG tablet Take 250 mg by mouth every Monday, Wednesday, and Friday.  . clonazePAM (KLONOPIN) 0.5 MG tablet Take 0.5 mg by mouth 2 (two) times daily as needed for anxiety.  . clopidogrel (PLAVIX) 75 MG tablet Take 1 tablet (75 mg total) by mouth daily.  . folic acid (FOLVITE) 1 MG tablet Take 1 mg by mouth daily.  Marland Kitchen ipratropium-albuterol (DUONEB) 0.5-2.5 (3) MG/3ML SOLN Inhale 3 mLs into the lungs 2 (two) times daily as needed (wheezing or shortness of breath).  . lamoTRIgine (LAMICTAL) 100 MG tablet Take 100 mg by mouth daily.  Marland Kitchen levothyroxine (SYNTHROID) 112 MCG tablet Take 1 tablet (112 mcg total) by mouth daily.  . methotrexate (RHEUMATREX) 2.5 MG tablet Take 20 mg by mouth every Saturday.  . mirtazapine (REMERON) 15 MG tablet Take 15 mg by mouth at bedtime.  . pantoprazole (PROTONIX) 40 MG tablet Take 40 mg by mouth.  . potassium chloride (KLOR-CON) 8 MEQ tablet Take 8 mEq by mouth  daily.  . predniSONE (DELTASONE) 5 MG tablet Take 2.5 mg by mouth daily.  Marland Kitchen venlafaxine XR (EFFEXOR-XR) 150 MG 24 hr capsule Take 150 mg by mouth daily.  . Vitamin D, Ergocalciferol, (DRISDOL) 1.25 MG (50000 UNIT) CAPS capsule Take 50,000 Int'l Units by mouth every 7 (seven) days.  . [DISCONTINUED] torsemide (DEMADEX) 20 MG tablet Take 2 tablets (40 mg total) by mouth daily.     Allergies:   Codeine and Demerol [meperidine]   Social History   Socioeconomic History  . Marital status: Married    Spouse name: Not on file  . Number of children: Not on file  . Years of education: Not on file  . Highest education level: Not on file  Occupational History  . Not on file  Tobacco Use  . Smoking status:  Never Smoker  . Smokeless tobacco: Never Used  Substance and Sexual Activity  . Alcohol use: Not on file  . Drug use: Not on file  . Sexual activity: Not on file  Other Topics Concern  . Not on file  Social History Narrative  . Not on file   Social Determinants of Health   Financial Resource Strain: Not on file  Food Insecurity: Not on file  Transportation Needs: Not on file  Physical Activity: Not on file  Stress: Not on file  Social Connections: Not on file     Family History: The patient's family history includes Breast cancer in her cousin, maternal aunt, and maternal aunt; COPD in her father; Cancer in her father.  ROS:   Please see the history of present illness.     All other systems reviewed and are negative.  EKGs/Labs/Other Studies Reviewed:    The following studies were reviewed today:   EKG:  EKG not  ordered today.    Recent Labs: 07/14/2020: ALT 17; B Natriuretic Peptide 171.8 07/15/2020: Hemoglobin 11.5; Platelets 217; TSH 0.046 07/21/2020: BUN 30; Creatinine, Ser 1.17; Potassium 3.8; Sodium 143  Recent Lipid Panel    Component Value Date/Time   CHOL 212 (H) 07/18/2016 0544   TRIG 110 07/18/2016 0544   HDL 49 07/18/2016 0544   CHOLHDL 4.3 07/18/2016 0544   VLDL 22 07/18/2016 0544   LDLCALC 141 (H) 07/18/2016 0544     Risk Assessment/Calculations:      Physical Exam:    VS:  BP 124/74 (BP Location: Right Arm, Patient Position: Sitting, Cuff Size: Normal)   Pulse 76   Ht 5\' 4"  (1.626 m)   Wt 172 lb (78 kg)   SpO2 97%   BMI 29.52 kg/m     Wt Readings from Last 3 Encounters:  09/04/20 172 lb (78 kg)  07/21/20 171 lb (77.6 kg)  07/14/20 172 lb (78 kg)     GEN:  Well nourished, well developed in no acute distress HEENT: Normal NECK: No JVD; No carotid bruits LYMPHATICS: No lymphadenopathy CARDIAC: RRR, no murmurs, rubs, gallops RESPIRATORY: Poor inspiratory effort, diminished breath sounds at bases, worse on left ABDOMEN: Soft,  non-tender, non-distended MUSCULOSKELETAL: Trace edema; ulnar deviation secondary to RA noted SKIN: Warm and dry NEUROLOGIC:  Alert and oriented x 3 PSYCHIATRIC:  Normal affect   ASSESSMENT:    1. Dyspnea on exertion   2. Pure hypercholesterolemia   3. Stenosis of carotid artery, unspecified laterality    PLAN:    In order of problems listed above:  1. Patient with history of shortness of breath and recent admission with acute CHF.  Echo 07/2020 showed normal  systolic function, EF 55 to 60%, impaired relaxation. cont torsemide to 40 mg daily.  She appears euvolemic.  Shortness of breath could be pulmonary in origin due to pulmonary fibrosis, COPD. 2. Hyperlipidemia, on Lipitor. 3. Mild to moderate carotid artery stenosis bilaterally on Plavix, Lipitor.  Follow-up in 6 months    Medication Adjustments/Labs and Tests Ordered: Current medicines are reviewed at length with the patient today.  Concerns regarding medicines are outlined above.  No orders of the defined types were placed in this encounter.  Meds ordered this encounter  Medications  . torsemide (DEMADEX) 20 MG tablet    Sig: Take 2 tablets (40 mg total) by mouth daily.    Dispense:  180 tablet    Refill:  2    Patient Instructions  Medication Instructions:  - Your physician recommends that you continue on your current medications as directed. Please refer to the Current Medication list given to you today.  - Refills have been sent to your pharmacy for torsemide  *If you need a refill on your cardiac medications before your next appointment, please call your pharmacy*   Lab Work: - none ordered  If you have labs (blood work) drawn today and your tests are completely normal, you will receive your results only by: Marland Kitchen MyChart Message (if you have MyChart) OR . A paper copy in the mail If you have any lab test that is abnormal or we need to change your treatment, we will call you to review the  results.   Testing/Procedures: - none ordered   Follow-Up: At [facility], you and your health needs are our priority.  As part of our continuing mission to provide you with exceptional heart care, we have created designated Provider Care Teams.  These Care Teams include your primary Cardiologist (physician) and Advanced Practice Providers (APPs -  Physician Assistants and Nurse Practitioners) who all work together to provide you with the care you need, when you need it.  We recommend signing up for the patient portal called "MyChart".  Sign up information is provided on this After Visit Summary.  MyChart is used to connect with patients for Virtual Visits (Telemedicine).  Patients are able to view lab/test results, encounter notes, upcoming appointments, etc.  Non-urgent messages can be sent to your provider as well.   To learn more about what you can do with MyChart, go to [email or web address].    Your next appointment:   6 month(s)  The format for your next appointment:   In Person  Provider:   You may see Kate Sable, MD or one of the following Advanced Practice Providers on your designated Care Team:    Murray Hodgkins, NP  Christell Faith, PA-C  Marrianne Mood, PA-C  Cadence [place], [place]  Laurann Montana, NP    Other Instructions n/a     Signed, Kate Sable, MD  09/04/2020 4:38 PM    [place]

## 2020-09-08 DIAGNOSIS — J45901 Unspecified asthma with (acute) exacerbation: Secondary | ICD-10-CM | POA: Diagnosis not present

## 2020-09-14 ENCOUNTER — Ambulatory Visit
Admission: RE | Admit: 2020-09-14 | Discharge: 2020-09-14 | Disposition: A | Discharge status: Home / Self Care | Payer: PPO | Source: Ambulatory Visit | Attending: Physician Assistant | Admitting: Physician Assistant

## 2020-09-14 ENCOUNTER — Other Ambulatory Visit: Payer: Self-pay

## 2020-09-14 DIAGNOSIS — J8410 Pulmonary fibrosis, unspecified: Secondary | ICD-10-CM | POA: Diagnosis not present

## 2020-09-14 DIAGNOSIS — Z8673 Personal history of transient ischemic attack (TIA), and cerebral infarction without residual deficits: Secondary | ICD-10-CM | POA: Diagnosis not present

## 2020-09-14 DIAGNOSIS — R7309 Other abnormal glucose: Secondary | ICD-10-CM | POA: Diagnosis not present

## 2020-09-14 DIAGNOSIS — R4781 Slurred speech: Secondary | ICD-10-CM | POA: Diagnosis not present

## 2020-09-14 DIAGNOSIS — E034 Atrophy of thyroid (acquired): Secondary | ICD-10-CM | POA: Diagnosis not present

## 2020-09-14 DIAGNOSIS — G4733 Obstructive sleep apnea (adult) (pediatric): Secondary | ICD-10-CM | POA: Diagnosis not present

## 2020-09-15 DIAGNOSIS — D329 Benign neoplasm of meninges, unspecified: Secondary | ICD-10-CM | POA: Insufficient documentation

## 2020-09-15 DIAGNOSIS — R7309 Other abnormal glucose: Secondary | ICD-10-CM | POA: Diagnosis not present

## 2020-09-15 DIAGNOSIS — E034 Atrophy of thyroid (acquired): Secondary | ICD-10-CM | POA: Diagnosis not present

## 2020-09-15 DIAGNOSIS — I5032 Chronic diastolic (congestive) heart failure: Secondary | ICD-10-CM | POA: Insufficient documentation

## 2020-09-15 DIAGNOSIS — G4733 Obstructive sleep apnea (adult) (pediatric): Secondary | ICD-10-CM | POA: Diagnosis not present

## 2020-09-15 DIAGNOSIS — J8410 Pulmonary fibrosis, unspecified: Secondary | ICD-10-CM | POA: Diagnosis not present

## 2020-09-28 ENCOUNTER — Other Ambulatory Visit: Payer: Self-pay

## 2020-09-28 ENCOUNTER — Ambulatory Visit: Payer: PPO | Admitting: Dermatology

## 2020-09-28 DIAGNOSIS — L578 Other skin changes due to chronic exposure to nonionizing radiation: Secondary | ICD-10-CM

## 2020-09-28 DIAGNOSIS — D1722 Benign lipomatous neoplasm of skin and subcutaneous tissue of left arm: Secondary | ICD-10-CM | POA: Diagnosis not present

## 2020-09-28 DIAGNOSIS — L570 Actinic keratosis: Secondary | ICD-10-CM

## 2020-09-28 DIAGNOSIS — D485 Neoplasm of uncertain behavior of skin: Secondary | ICD-10-CM | POA: Diagnosis not present

## 2020-09-28 DIAGNOSIS — D1720 Benign lipomatous neoplasm of skin and subcutaneous tissue of unspecified limb: Secondary | ICD-10-CM

## 2020-09-28 NOTE — Progress Notes (Signed)
   New Patient Visit  Subjective  [name] is a 75 y.o. female who presents for the following: Other (Pt c/o of spot on nose that she would like checked. She would also like to discuss lipomas on arms.).  Objective  Well appearing patient in no apparent distress; mood and affect are within normal limits.  A focused examination was performed including face and arms. Relevant physical exam findings are noted in the Assessment and Plan.   Nose Erythematous thin papules/macules with gritty scale.   Left Forearm - Anterior Symptomatic, sore lipoma Left forearm x 2 2.1 cm 1.5 cm    Nose 1.5 x 1 cm Pink patch to right nose       Assessment & Plan  AK (actinic keratosis) Nose Actinic keratoses are precancerous spots that appear secondary to cumulative UV radiation exposure/sun exposure over time. They are chronic with expected duration over 1 year. A portion of actinic keratoses will progress to squamous cell carcinoma of the skin. It is not possible to reliably predict which spots will progress to skin cancer and so treatment is recommended to prevent development of skin cancer.  Recommend daily broad spectrum sunscreen SPF 30+ to sun-exposed areas, reapply every 2 hours as needed.  Recommend staying in the shade or wearing long sleeves, sun glasses (UVA+UVB protection) and wide brim hats (4-inch brim around the entire circumference of the hat). Call for new or changing lesions.  Prior to procedure, discussed risks of blister formation, small wound, skin dyspigmentation, or rare scar following cryotherapy. Recommend Vaseline ointment to treated areas while healing.  Destruction of lesion - Nose Complexity: simple   Destruction method: cryotherapy   Informed consent: discussed and consent obtained   Timeout:  patient name, date of birth, surgical site, and procedure verified Lesion destroyed using liquid nitrogen: Yes   Region frozen until ice ball extended beyond  lesion: Yes   Outcome: patient tolerated procedure well with no complications   Post-procedure details: wound care instructions given    Lipoma of upper extremity -left forearm with symptoms patient would like treated. Left Forearm - Anterior Pt will schedule surgery for left forearm. Will discuss removal of lipoma on right forearm at f/u.   Neoplasm of uncertain behavior of skin Nose Recheck on f/u  Actinic Damage - chronic, secondary to cumulative UV radiation exposure/sun exposure over time - diffuse scaly erythematous macules with underlying dyspigmentation - Recommend daily broad spectrum sunscreen SPF 30+ to sun-exposed areas, reapply every 2 hours as needed.  - Recommend staying in the shade or wearing long sleeves, sun glasses (UVA+UVB protection) and wide brim hats (4-inch brim around the entire circumference of the hat). - Call for new or changing lesions.  Return for 6-8 weeks AK, lipoma excison first available.  IHarriett Sine, CMA, am acting as scribe for Sarina Ser, MD.  Documentation: I have reviewed the above documentation for accuracy and completeness, and I agree with the above.  Sarina Ser, MD

## 2020-10-07 ENCOUNTER — Encounter: Payer: Self-pay | Admitting: Dermatology

## 2020-10-08 DIAGNOSIS — J45901 Unspecified asthma with (acute) exacerbation: Secondary | ICD-10-CM | POA: Diagnosis not present

## 2020-11-08 DIAGNOSIS — J45901 Unspecified asthma with (acute) exacerbation: Secondary | ICD-10-CM | POA: Diagnosis not present

## 2020-11-14 ENCOUNTER — Encounter: Payer: PPO | Admitting: Dermatology

## 2020-11-27 DIAGNOSIS — F331 Major depressive disorder, recurrent, moderate: Secondary | ICD-10-CM | POA: Diagnosis not present

## 2020-11-27 DIAGNOSIS — F411 Generalized anxiety disorder: Secondary | ICD-10-CM | POA: Diagnosis not present

## 2020-11-27 DIAGNOSIS — F5105 Insomnia due to other mental disorder: Secondary | ICD-10-CM | POA: Diagnosis not present

## 2020-11-30 DIAGNOSIS — J9611 Chronic respiratory failure with hypoxia: Secondary | ICD-10-CM | POA: Diagnosis not present

## 2020-11-30 DIAGNOSIS — E034 Atrophy of thyroid (acquired): Secondary | ICD-10-CM | POA: Diagnosis not present

## 2020-11-30 DIAGNOSIS — I5032 Chronic diastolic (congestive) heart failure: Secondary | ICD-10-CM | POA: Diagnosis not present

## 2020-11-30 DIAGNOSIS — D329 Benign neoplasm of meninges, unspecified: Secondary | ICD-10-CM | POA: Diagnosis not present

## 2020-11-30 DIAGNOSIS — M0579 Rheumatoid arthritis with rheumatoid factor of multiple sites without organ or systems involvement: Secondary | ICD-10-CM | POA: Diagnosis not present

## 2020-11-30 DIAGNOSIS — D849 Immunodeficiency, unspecified: Secondary | ICD-10-CM | POA: Diagnosis not present

## 2020-11-30 DIAGNOSIS — R7309 Other abnormal glucose: Secondary | ICD-10-CM | POA: Diagnosis not present

## 2020-11-30 DIAGNOSIS — J8410 Pulmonary fibrosis, unspecified: Secondary | ICD-10-CM | POA: Diagnosis not present

## 2020-11-30 DIAGNOSIS — I779 Disorder of arteries and arterioles, unspecified: Secondary | ICD-10-CM | POA: Diagnosis not present

## 2020-11-30 DIAGNOSIS — F3341 Major depressive disorder, recurrent, in partial remission: Secondary | ICD-10-CM | POA: Diagnosis not present

## 2020-11-30 DIAGNOSIS — G4733 Obstructive sleep apnea (adult) (pediatric): Secondary | ICD-10-CM | POA: Diagnosis not present

## 2020-11-30 DIAGNOSIS — E7849 Other hyperlipidemia: Secondary | ICD-10-CM | POA: Diagnosis not present

## 2020-12-09 DIAGNOSIS — J45901 Unspecified asthma with (acute) exacerbation: Secondary | ICD-10-CM | POA: Diagnosis not present

## 2020-12-11 DIAGNOSIS — Z7982 Long term (current) use of aspirin: Secondary | ICD-10-CM | POA: Diagnosis not present

## 2020-12-11 DIAGNOSIS — Z87891 Personal history of nicotine dependence: Secondary | ICD-10-CM | POA: Diagnosis not present

## 2020-12-11 DIAGNOSIS — J479 Bronchiectasis, uncomplicated: Secondary | ICD-10-CM | POA: Diagnosis not present

## 2020-12-11 DIAGNOSIS — I2510 Atherosclerotic heart disease of native coronary artery without angina pectoris: Secondary | ICD-10-CM | POA: Diagnosis not present

## 2020-12-11 DIAGNOSIS — G4733 Obstructive sleep apnea (adult) (pediatric): Secondary | ICD-10-CM | POA: Diagnosis not present

## 2020-12-11 DIAGNOSIS — I5032 Chronic diastolic (congestive) heart failure: Secondary | ICD-10-CM | POA: Diagnosis not present

## 2020-12-11 DIAGNOSIS — Z90710 Acquired absence of both cervix and uterus: Secondary | ICD-10-CM | POA: Diagnosis not present

## 2020-12-11 DIAGNOSIS — D329 Benign neoplasm of meninges, unspecified: Secondary | ICD-10-CM | POA: Diagnosis not present

## 2020-12-11 DIAGNOSIS — M1990 Unspecified osteoarthritis, unspecified site: Secondary | ICD-10-CM | POA: Diagnosis not present

## 2020-12-11 DIAGNOSIS — J9611 Chronic respiratory failure with hypoxia: Secondary | ICD-10-CM | POA: Diagnosis not present

## 2020-12-11 DIAGNOSIS — E034 Atrophy of thyroid (acquired): Secondary | ICD-10-CM | POA: Diagnosis not present

## 2020-12-11 DIAGNOSIS — D849 Immunodeficiency, unspecified: Secondary | ICD-10-CM | POA: Diagnosis not present

## 2020-12-11 DIAGNOSIS — Z7902 Long term (current) use of antithrombotics/antiplatelets: Secondary | ICD-10-CM | POA: Diagnosis not present

## 2020-12-11 DIAGNOSIS — Z9981 Dependence on supplemental oxygen: Secondary | ICD-10-CM | POA: Diagnosis not present

## 2020-12-11 DIAGNOSIS — I110 Hypertensive heart disease with heart failure: Secondary | ICD-10-CM | POA: Diagnosis not present

## 2020-12-11 DIAGNOSIS — F419 Anxiety disorder, unspecified: Secondary | ICD-10-CM | POA: Diagnosis not present

## 2020-12-11 DIAGNOSIS — E7849 Other hyperlipidemia: Secondary | ICD-10-CM | POA: Diagnosis not present

## 2020-12-11 DIAGNOSIS — F3341 Major depressive disorder, recurrent, in partial remission: Secondary | ICD-10-CM | POA: Diagnosis not present

## 2020-12-11 DIAGNOSIS — E559 Vitamin D deficiency, unspecified: Secondary | ICD-10-CM | POA: Diagnosis not present

## 2020-12-11 DIAGNOSIS — J8410 Pulmonary fibrosis, unspecified: Secondary | ICD-10-CM | POA: Diagnosis not present

## 2020-12-11 DIAGNOSIS — M0589 Other rheumatoid arthritis with rheumatoid factor of multiple sites: Secondary | ICD-10-CM | POA: Diagnosis not present

## 2020-12-11 DIAGNOSIS — Z7952 Long term (current) use of systemic steroids: Secondary | ICD-10-CM | POA: Diagnosis not present

## 2020-12-11 DIAGNOSIS — Z8673 Personal history of transient ischemic attack (TIA), and cerebral infarction without residual deficits: Secondary | ICD-10-CM | POA: Diagnosis not present

## 2020-12-19 ENCOUNTER — Encounter: Payer: PPO | Admitting: Dermatology

## 2020-12-20 ENCOUNTER — Ambulatory Visit: Payer: PPO | Admitting: Dermatology

## 2020-12-20 ENCOUNTER — Other Ambulatory Visit: Payer: Self-pay

## 2020-12-20 DIAGNOSIS — Z872 Personal history of diseases of the skin and subcutaneous tissue: Secondary | ICD-10-CM | POA: Diagnosis not present

## 2020-12-20 DIAGNOSIS — L578 Other skin changes due to chronic exposure to nonionizing radiation: Secondary | ICD-10-CM | POA: Diagnosis not present

## 2020-12-20 DIAGNOSIS — D1722 Benign lipomatous neoplasm of skin and subcutaneous tissue of left arm: Secondary | ICD-10-CM | POA: Diagnosis not present

## 2020-12-20 DIAGNOSIS — D1721 Benign lipomatous neoplasm of skin and subcutaneous tissue of right arm: Secondary | ICD-10-CM | POA: Diagnosis not present

## 2020-12-20 NOTE — Patient Instructions (Addendum)
If you have any questions or concerns for your doctor, please call our main line at [phone] and press option 4 to reach your doctor's medical assistant. If no one answers, please leave a voicemail as directed and we will return your call as soon as possible. Messages left after 4 pm will be answered the following business day.   You may also send us a message via MyChart. We typically respond to MyChart messages within 1-2 business days.  For prescription refills, please ask your pharmacy to contact our office. Our fax number is [phone].  If you have an urgent issue when the clinic is closed that cannot wait until the next business day, you can page your doctor at the number below.    Please note that while we do our best to be available for urgent issues outside of office hours, we are not available 24/7.   If you have an urgent issue and are unable to reach us, you may choose to seek medical care at your doctor's office, retail clinic, urgent care center, or emergency room.  If you have a medical emergency, please immediately call 911 or go to the emergency department.  Pager Numbers  - Dr. Kowalski: [phone]  - Dr. Moye: [phone]  - Dr. Stewart: [phone]  In the event of inclement weather, please call our main line at [phone] for an update on the status of any delays or closures.  Dermatology Medication Tips: Please keep the boxes that topical medications come in in order to help keep track of the instructions about where and how to use these. Pharmacies typically print the medication instructions only on the boxes and not directly on the medication tubes.   If your medication is too expensive, please contact our office at [phone] option 4 or send us a message through MyChart.   We are unable to tell what your co-pay for medications will be in advance as this is different depending on your insurance coverage. However, we may be able to find a substitute  medication at lower cost or fill out paperwork to get insurance to cover a needed medication.   If a prior authorization is required to get your medication covered by your insurance company, please allow us 1-2 business days to complete this process.  Drug prices often vary depending on where the prescription is filled and some pharmacies may offer cheaper prices.  The website www.goodrx.com contains coupons for medications through different pharmacies. The prices here do not account for what the cost may be with help from insurance (it may be cheaper with your insurance), but the website can give you the price if you did not use any insurance.  - You can print the associated coupon and take it with your prescription to the pharmacy.  - You may also stop by our office during regular business hours and pick up a GoodRx coupon card.  - If you need your prescription sent electronically to a different pharmacy, notify our office through [facility] MyChart or by phone at [phone] option 4.        Pre-Operative Instructions  You are scheduled for a surgical procedure at [facility]. We recommend you read the following instructions. If you have any questions or concerns, please call the office at [phone].  Shower and wash the entire body with soap and water the day of your surgery paying special attention to cleansing at and around the planned surgery site.  Avoid aspirin or aspirin containing products   at least fourteen (14) days prior to your surgical procedure and for at least one week (7 Days) after your surgical procedure. If you take aspirin on a regular basis for heart disease or history of stroke or for any other reason, we may recommend you continue taking aspirin but please notify us if you take this on a regular basis. Aspirin can cause more bleeding to occur during surgery as well as prolonged bleeding and bruising after surgery.   Avoid other nonsteroidal pain  medications at least one week prior to surgery and at least one week prior to your surgery. These include medications such as Ibuprofen (Motrin, Advil and Nuprin), Naprosyn, Voltaren, Relafen, etc. If medications are used for therapeutic reasons, please inform us as they can cause increased bleeding or prolonged bleeding during and bruising after surgical procedures.   Please advise us if you are taking any "blood thinner" medications such as Coumadin or Dipyridamole or Plavix or similar medications. These cause increased bleeding and prolonged bleeding during procedures and bruising after surgical procedures. We may have to consider discontinuing these medications briefly prior to and shortly after your surgery if safe to do so.   Please inform us of all medications you are currently taking. All medications that are taken regularly should be taken the day of surgery as you always do. Nevertheless, we need to be informed of what medications you are taking prior to surgery to know whether they will affect the procedure or cause any complications.   Please inform us of any medication allergies. Also inform us of whether you have allergies to Latex or rubber products or whether you have had any adverse reaction to Lidocaine or Epinephrine.  Please inform us of any prosthetic or artificial body parts such as artificial heart valve, joint replacements, etc., or similar condition that might require preoperative antibiotics.   We recommend avoidance of alcohol at least two weeks prior to surgery and continued avoidance for at least two weeks after surgery.   We recommend discontinuation of tobacco smoking at least two weeks prior to surgery and continued abstinence for at least two weeks after surgery.  Do not plan strenuous exercise, strenuous work or strenuous lifting for approximately four weeks after your surgery.   We request if you are unable to make your scheduled surgical appointment, please call us  at least a week in advance or as soon as you are aware of a problem so that we can cancel or reschedule the appointment.   You MAY TAKE TYLENOL (acetaminophen) for pain as it is not a blood thinner.   PLEASE PLAN TO BE IN TOWN FOR TWO WEEKS FOLLOWING SURGERY, THIS IS IMPORTANT SO YOU CAN BE CHECKED FOR DRESSING CHANGES, SUTURE REMOVAL AND TO MONITOR FOR POSSIBLE COMPLICATIONS.  

## 2020-12-20 NOTE — Progress Notes (Signed)
   Follow-Up Visit   Subjective  [name] is a 75 y.o. female who presents for the following: Actinic Keratosis (Nose, 43m f/u).  Patient accompanied by husband who contributes to history.  The following portions of the chart were reviewed this encounter and updated as appropriate:   Tobacco  Allergies  Meds  Problems  Med Hx  Surg Hx  Fam Hx     Review of Systems:  No other skin or systemic complaints except as noted in HPI or Assessment and Plan.  Objective  Well appearing patient in no apparent distress; mood and affect are within normal limits.  A focused examination was performed including face, arns. Relevant physical exam findings are noted in the Assessment and Plan.  bil arms Rubbery nodules  Nose Clear today   Assessment & Plan  Lipoma of left upper extremity - symptomatic bil arms Pt will schedule for surgeries when finished with PT.  Advised pt may can do 2 in same area but will need several surgery appts.  History of actinic keratoses Nose Clear. Observe for recurrence. Call clinic for new or changing lesions.  Recommend regular skin exams, daily broad-spectrum spf 30+ sunscreen use, and photoprotection.     Actinic Damage - chronic, secondary to cumulative UV radiation exposure/sun exposure over time - diffuse scaly erythematous macules with underlying dyspigmentation - Recommend daily broad spectrum sunscreen SPF 30+ to sun-exposed areas, reapply every 2 hours as needed.  - Recommend staying in the shade or wearing long sleeves, sun glasses (UVA+UVB protection) and wide brim hats (4-inch brim around the entire circumference of the hat). - Call for new or changing lesions.  Return in about 2 months (around 02/19/2021) for to be scheduled for Lipoma surgeries.  I, Othelia Pulling, RMA, am acting as scribe for Sarina Ser, MD .  Documentation: I have reviewed the above documentation for accuracy and completeness, and I agree with the above.  Sarina Ser, MD

## 2020-12-25 ENCOUNTER — Encounter: Payer: Self-pay | Admitting: Dermatology

## 2020-12-25 DIAGNOSIS — Z7982 Long term (current) use of aspirin: Secondary | ICD-10-CM | POA: Diagnosis not present

## 2020-12-25 DIAGNOSIS — D329 Benign neoplasm of meninges, unspecified: Secondary | ICD-10-CM | POA: Diagnosis not present

## 2020-12-25 DIAGNOSIS — I5032 Chronic diastolic (congestive) heart failure: Secondary | ICD-10-CM | POA: Diagnosis not present

## 2020-12-25 DIAGNOSIS — J479 Bronchiectasis, uncomplicated: Secondary | ICD-10-CM | POA: Diagnosis not present

## 2020-12-25 DIAGNOSIS — G4733 Obstructive sleep apnea (adult) (pediatric): Secondary | ICD-10-CM | POA: Diagnosis not present

## 2020-12-25 DIAGNOSIS — D849 Immunodeficiency, unspecified: Secondary | ICD-10-CM | POA: Diagnosis not present

## 2020-12-25 DIAGNOSIS — Z7952 Long term (current) use of systemic steroids: Secondary | ICD-10-CM | POA: Diagnosis not present

## 2020-12-25 DIAGNOSIS — F419 Anxiety disorder, unspecified: Secondary | ICD-10-CM | POA: Diagnosis not present

## 2020-12-25 DIAGNOSIS — I2510 Atherosclerotic heart disease of native coronary artery without angina pectoris: Secondary | ICD-10-CM | POA: Diagnosis not present

## 2020-12-25 DIAGNOSIS — Z90710 Acquired absence of both cervix and uterus: Secondary | ICD-10-CM | POA: Diagnosis not present

## 2020-12-25 DIAGNOSIS — F3341 Major depressive disorder, recurrent, in partial remission: Secondary | ICD-10-CM | POA: Diagnosis not present

## 2020-12-25 DIAGNOSIS — I110 Hypertensive heart disease with heart failure: Secondary | ICD-10-CM | POA: Diagnosis not present

## 2020-12-25 DIAGNOSIS — M0589 Other rheumatoid arthritis with rheumatoid factor of multiple sites: Secondary | ICD-10-CM | POA: Diagnosis not present

## 2020-12-25 DIAGNOSIS — Z9981 Dependence on supplemental oxygen: Secondary | ICD-10-CM | POA: Diagnosis not present

## 2020-12-25 DIAGNOSIS — E7849 Other hyperlipidemia: Secondary | ICD-10-CM | POA: Diagnosis not present

## 2020-12-25 DIAGNOSIS — E559 Vitamin D deficiency, unspecified: Secondary | ICD-10-CM | POA: Diagnosis not present

## 2020-12-25 DIAGNOSIS — J8410 Pulmonary fibrosis, unspecified: Secondary | ICD-10-CM | POA: Diagnosis not present

## 2020-12-25 DIAGNOSIS — Z87891 Personal history of nicotine dependence: Secondary | ICD-10-CM | POA: Diagnosis not present

## 2020-12-25 DIAGNOSIS — Z8673 Personal history of transient ischemic attack (TIA), and cerebral infarction without residual deficits: Secondary | ICD-10-CM | POA: Diagnosis not present

## 2020-12-25 DIAGNOSIS — J9611 Chronic respiratory failure with hypoxia: Secondary | ICD-10-CM | POA: Diagnosis not present

## 2020-12-25 DIAGNOSIS — Z7902 Long term (current) use of antithrombotics/antiplatelets: Secondary | ICD-10-CM | POA: Diagnosis not present

## 2020-12-25 DIAGNOSIS — E034 Atrophy of thyroid (acquired): Secondary | ICD-10-CM | POA: Diagnosis not present

## 2020-12-25 DIAGNOSIS — M1990 Unspecified osteoarthritis, unspecified site: Secondary | ICD-10-CM | POA: Diagnosis not present

## 2021-01-01 DIAGNOSIS — Z7982 Long term (current) use of aspirin: Secondary | ICD-10-CM | POA: Diagnosis not present

## 2021-01-01 DIAGNOSIS — J8410 Pulmonary fibrosis, unspecified: Secondary | ICD-10-CM | POA: Diagnosis not present

## 2021-01-01 DIAGNOSIS — Z7902 Long term (current) use of antithrombotics/antiplatelets: Secondary | ICD-10-CM | POA: Diagnosis not present

## 2021-01-01 DIAGNOSIS — D849 Immunodeficiency, unspecified: Secondary | ICD-10-CM | POA: Diagnosis not present

## 2021-01-01 DIAGNOSIS — F3341 Major depressive disorder, recurrent, in partial remission: Secondary | ICD-10-CM | POA: Diagnosis not present

## 2021-01-01 DIAGNOSIS — D329 Benign neoplasm of meninges, unspecified: Secondary | ICD-10-CM | POA: Diagnosis not present

## 2021-01-01 DIAGNOSIS — F419 Anxiety disorder, unspecified: Secondary | ICD-10-CM | POA: Diagnosis not present

## 2021-01-01 DIAGNOSIS — E7849 Other hyperlipidemia: Secondary | ICD-10-CM | POA: Diagnosis not present

## 2021-01-01 DIAGNOSIS — J479 Bronchiectasis, uncomplicated: Secondary | ICD-10-CM | POA: Diagnosis not present

## 2021-01-01 DIAGNOSIS — Z7952 Long term (current) use of systemic steroids: Secondary | ICD-10-CM | POA: Diagnosis not present

## 2021-01-01 DIAGNOSIS — I5032 Chronic diastolic (congestive) heart failure: Secondary | ICD-10-CM | POA: Diagnosis not present

## 2021-01-01 DIAGNOSIS — Z8673 Personal history of transient ischemic attack (TIA), and cerebral infarction without residual deficits: Secondary | ICD-10-CM | POA: Diagnosis not present

## 2021-01-01 DIAGNOSIS — G4733 Obstructive sleep apnea (adult) (pediatric): Secondary | ICD-10-CM | POA: Diagnosis not present

## 2021-01-01 DIAGNOSIS — M0589 Other rheumatoid arthritis with rheumatoid factor of multiple sites: Secondary | ICD-10-CM | POA: Diagnosis not present

## 2021-01-01 DIAGNOSIS — E034 Atrophy of thyroid (acquired): Secondary | ICD-10-CM | POA: Diagnosis not present

## 2021-01-01 DIAGNOSIS — Z90710 Acquired absence of both cervix and uterus: Secondary | ICD-10-CM | POA: Diagnosis not present

## 2021-01-01 DIAGNOSIS — Z9981 Dependence on supplemental oxygen: Secondary | ICD-10-CM | POA: Diagnosis not present

## 2021-01-01 DIAGNOSIS — E559 Vitamin D deficiency, unspecified: Secondary | ICD-10-CM | POA: Diagnosis not present

## 2021-01-01 DIAGNOSIS — Z87891 Personal history of nicotine dependence: Secondary | ICD-10-CM | POA: Diagnosis not present

## 2021-01-01 DIAGNOSIS — J9611 Chronic respiratory failure with hypoxia: Secondary | ICD-10-CM | POA: Diagnosis not present

## 2021-01-01 DIAGNOSIS — I2510 Atherosclerotic heart disease of native coronary artery without angina pectoris: Secondary | ICD-10-CM | POA: Diagnosis not present

## 2021-01-01 DIAGNOSIS — M1990 Unspecified osteoarthritis, unspecified site: Secondary | ICD-10-CM | POA: Diagnosis not present

## 2021-01-01 DIAGNOSIS — I110 Hypertensive heart disease with heart failure: Secondary | ICD-10-CM | POA: Diagnosis not present

## 2021-01-08 DIAGNOSIS — J45901 Unspecified asthma with (acute) exacerbation: Secondary | ICD-10-CM | POA: Diagnosis not present

## 2021-01-09 DIAGNOSIS — I5032 Chronic diastolic (congestive) heart failure: Secondary | ICD-10-CM | POA: Diagnosis not present

## 2021-01-09 DIAGNOSIS — M0589 Other rheumatoid arthritis with rheumatoid factor of multiple sites: Secondary | ICD-10-CM | POA: Diagnosis not present

## 2021-01-09 DIAGNOSIS — J479 Bronchiectasis, uncomplicated: Secondary | ICD-10-CM | POA: Diagnosis not present

## 2021-01-09 DIAGNOSIS — M1990 Unspecified osteoarthritis, unspecified site: Secondary | ICD-10-CM | POA: Diagnosis not present

## 2021-01-09 DIAGNOSIS — J8410 Pulmonary fibrosis, unspecified: Secondary | ICD-10-CM | POA: Diagnosis not present

## 2021-01-09 DIAGNOSIS — I110 Hypertensive heart disease with heart failure: Secondary | ICD-10-CM | POA: Diagnosis not present

## 2021-01-09 DIAGNOSIS — I2510 Atherosclerotic heart disease of native coronary artery without angina pectoris: Secondary | ICD-10-CM | POA: Diagnosis not present

## 2021-02-08 DIAGNOSIS — J45901 Unspecified asthma with (acute) exacerbation: Secondary | ICD-10-CM | POA: Diagnosis not present

## 2021-02-13 DIAGNOSIS — Z79899 Other long term (current) drug therapy: Secondary | ICD-10-CM | POA: Diagnosis not present

## 2021-02-13 DIAGNOSIS — M0579 Rheumatoid arthritis with rheumatoid factor of multiple sites without organ or systems involvement: Secondary | ICD-10-CM | POA: Diagnosis not present

## 2021-02-13 DIAGNOSIS — R0609 Other forms of dyspnea: Secondary | ICD-10-CM | POA: Diagnosis not present

## 2021-02-26 ENCOUNTER — Ambulatory Visit: Payer: PPO

## 2021-02-27 ENCOUNTER — Encounter: Payer: PPO | Admitting: Dermatology

## 2021-03-28 DIAGNOSIS — F331 Major depressive disorder, recurrent, moderate: Secondary | ICD-10-CM | POA: Diagnosis not present

## 2021-03-28 DIAGNOSIS — F411 Generalized anxiety disorder: Secondary | ICD-10-CM | POA: Diagnosis not present

## 2021-03-28 DIAGNOSIS — F5105 Insomnia due to other mental disorder: Secondary | ICD-10-CM | POA: Diagnosis not present

## 2021-04-09 DIAGNOSIS — Z23 Encounter for immunization: Secondary | ICD-10-CM | POA: Diagnosis not present

## 2021-04-09 DIAGNOSIS — E034 Atrophy of thyroid (acquired): Secondary | ICD-10-CM | POA: Diagnosis not present

## 2021-04-09 DIAGNOSIS — E7849 Other hyperlipidemia: Secondary | ICD-10-CM | POA: Diagnosis not present

## 2021-04-09 DIAGNOSIS — F3341 Major depressive disorder, recurrent, in partial remission: Secondary | ICD-10-CM | POA: Diagnosis not present

## 2021-04-09 DIAGNOSIS — Z0000 Encounter for general adult medical examination without abnormal findings: Secondary | ICD-10-CM | POA: Diagnosis not present

## 2021-04-09 DIAGNOSIS — M0579 Rheumatoid arthritis with rheumatoid factor of multiple sites without organ or systems involvement: Secondary | ICD-10-CM | POA: Diagnosis not present

## 2021-04-09 DIAGNOSIS — R7309 Other abnormal glucose: Secondary | ICD-10-CM | POA: Diagnosis not present

## 2021-04-09 DIAGNOSIS — R413 Other amnesia: Secondary | ICD-10-CM | POA: Diagnosis not present

## 2021-04-09 DIAGNOSIS — J8410 Pulmonary fibrosis, unspecified: Secondary | ICD-10-CM | POA: Diagnosis not present

## 2021-04-09 DIAGNOSIS — I5032 Chronic diastolic (congestive) heart failure: Secondary | ICD-10-CM | POA: Diagnosis not present

## 2021-04-09 DIAGNOSIS — D849 Immunodeficiency, unspecified: Secondary | ICD-10-CM | POA: Diagnosis not present

## 2021-04-09 DIAGNOSIS — I779 Disorder of arteries and arterioles, unspecified: Secondary | ICD-10-CM | POA: Diagnosis not present

## 2021-04-09 DIAGNOSIS — J9611 Chronic respiratory failure with hypoxia: Secondary | ICD-10-CM | POA: Diagnosis not present

## 2021-05-07 ENCOUNTER — Other Ambulatory Visit: Payer: Self-pay

## 2021-05-07 ENCOUNTER — Ambulatory Visit
Admission: RE | Admit: 2021-05-07 | Discharge: 2021-05-07 | Disposition: A | Discharge status: Home / Self Care | Payer: PPO | Source: Ambulatory Visit | Attending: Internal Medicine | Admitting: Internal Medicine

## 2021-05-07 DIAGNOSIS — Z1231 Encounter for screening mammogram for malignant neoplasm of breast: Secondary | ICD-10-CM | POA: Insufficient documentation

## 2021-05-25 DIAGNOSIS — E034 Atrophy of thyroid (acquired): Secondary | ICD-10-CM | POA: Diagnosis not present

## 2021-05-25 DIAGNOSIS — D849 Immunodeficiency, unspecified: Secondary | ICD-10-CM | POA: Diagnosis not present

## 2021-05-25 DIAGNOSIS — R7309 Other abnormal glucose: Secondary | ICD-10-CM | POA: Diagnosis not present

## 2021-05-25 DIAGNOSIS — E7849 Other hyperlipidemia: Secondary | ICD-10-CM | POA: Diagnosis not present

## 2021-05-25 DIAGNOSIS — M0579 Rheumatoid arthritis with rheumatoid factor of multiple sites without organ or systems involvement: Secondary | ICD-10-CM | POA: Diagnosis not present

## 2021-06-07 IMAGING — CT CT PELVIS W/ CM
2 of 3 series · 16 of 46 positions shown, 18 images · IV contrast (APPLIED)
Comparison: 02/05/2011

CLINICAL DATA: Right inguinal mass, pelvic fullness

EXAM:
CT PELVIS WITH CONTRAST
TECHNIQUE: Multidetector CT imaging of the pelvis was performed using the
standard protocol following the bolus administration of intravenous
contrast.
CONTRAST:  100mL OMNIPAQUE IOHEXOL 300 MG/ML  SOLN

[Series 2: routine abd/pel with · axial · 0.93mm/px · z∈[-738,-468]mm · 13 of 62 slices shown, 15 images]
[im 4/62  soft-tissue]
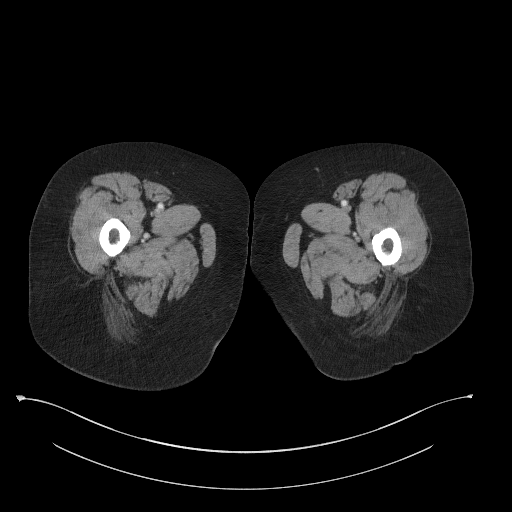
[im 4/62  bone]
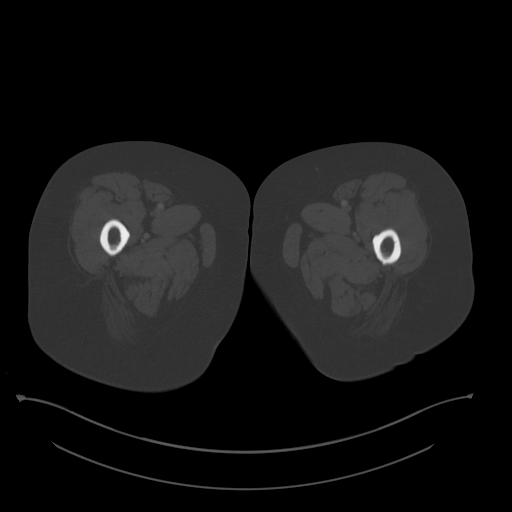
[im 8/62  soft-tissue]
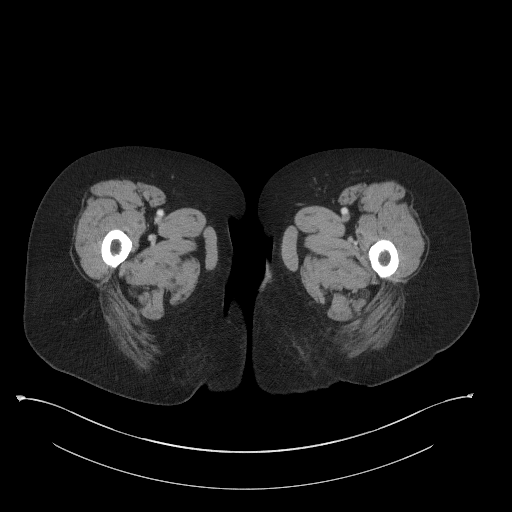
[im 12/62  soft-tissue]
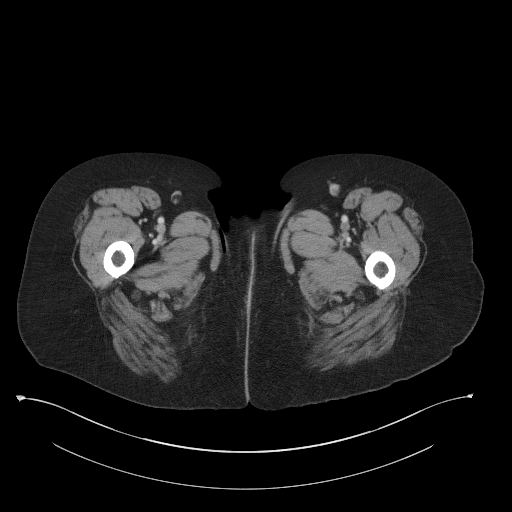
[im 18/62  soft-tissue]
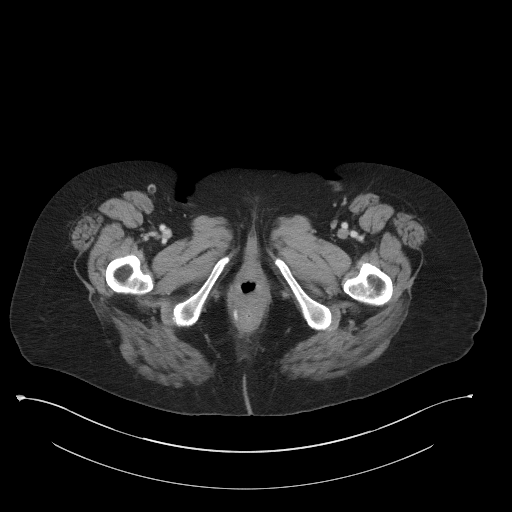
[im 22/62  soft-tissue]
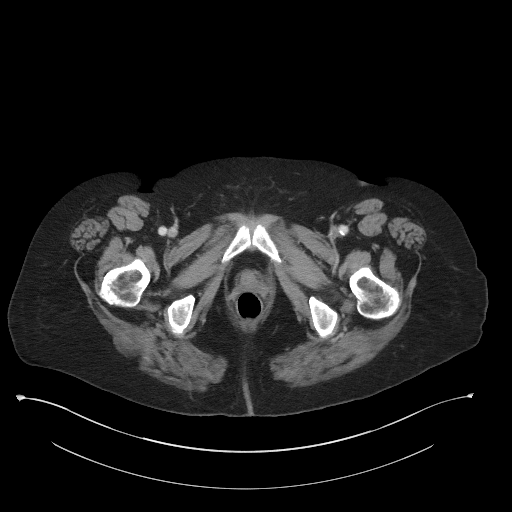
[im 26/62  soft-tissue]
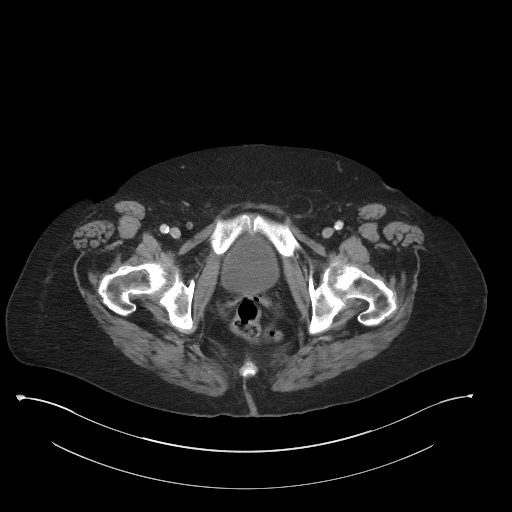
[im 32/62  soft-tissue]
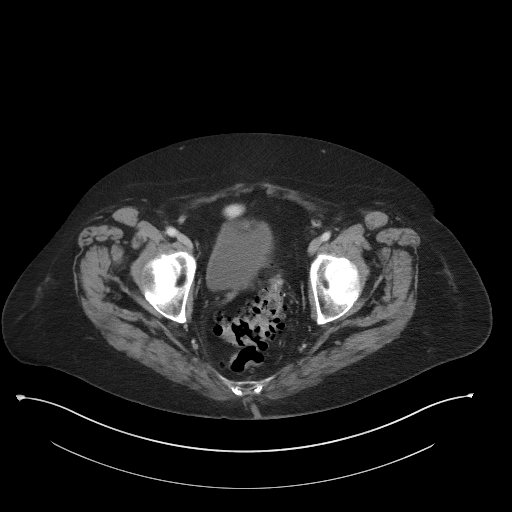
[im 36/62  soft-tissue]
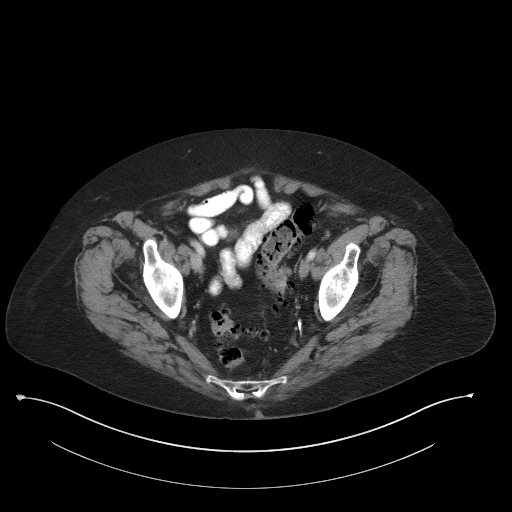
[im 40/62  soft-tissue]
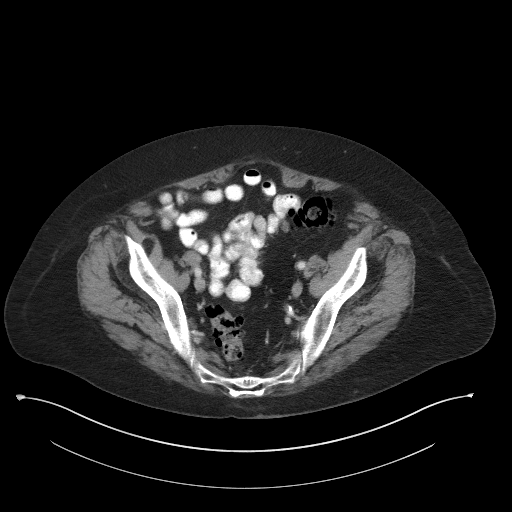
[im 40/62  bone]
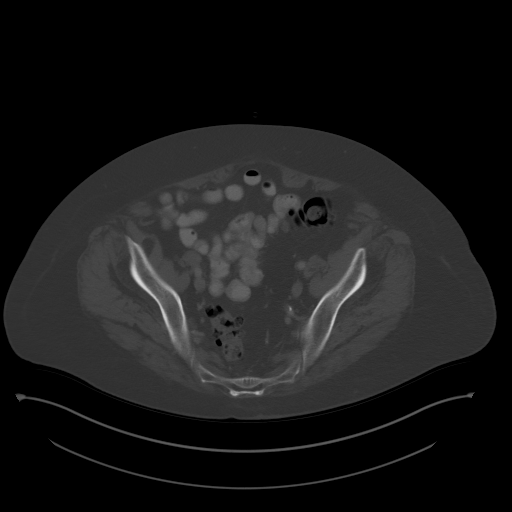
[im 44/62  soft-tissue]
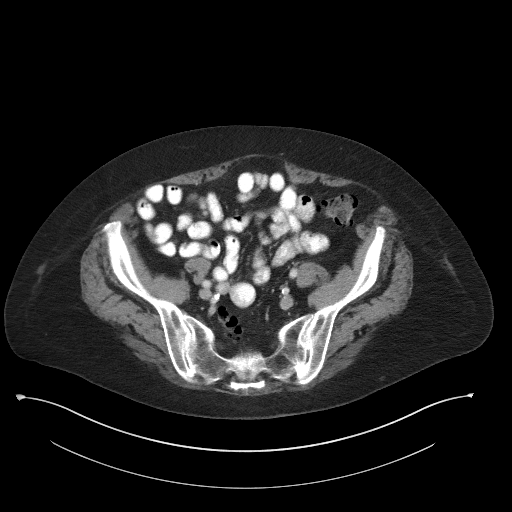
[im 50/62  soft-tissue]
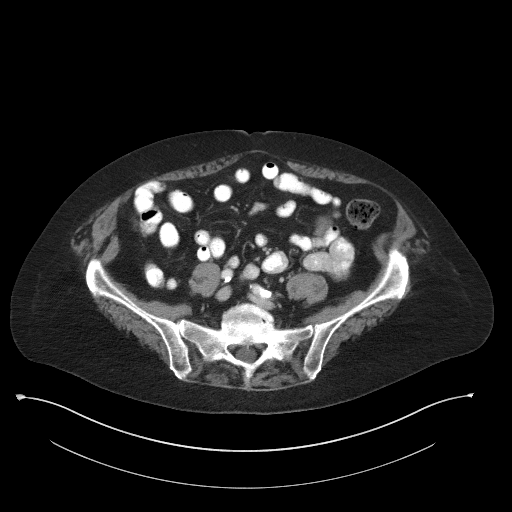
[im 54/62  soft-tissue]
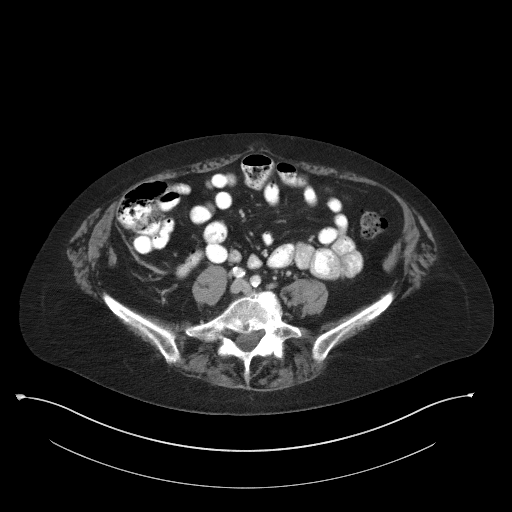
[im 58/62  soft-tissue]
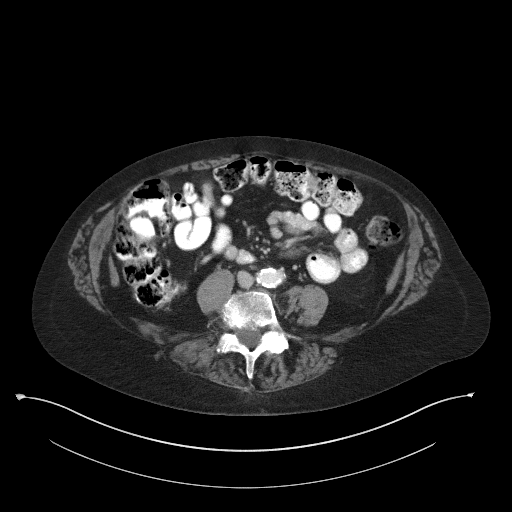

[Series 4: coronal st · coronal · 0.64mm/px · 3 of 92 slices shown]
[im 31/92  soft-tissue]
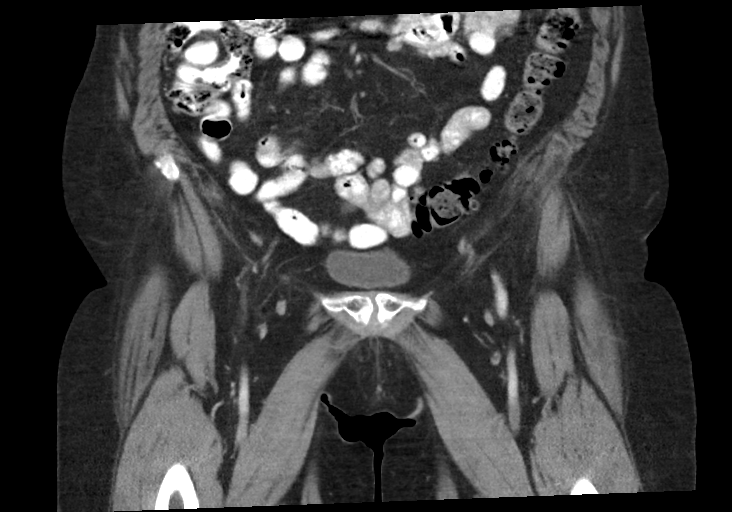
[im 41/92  soft-tissue]
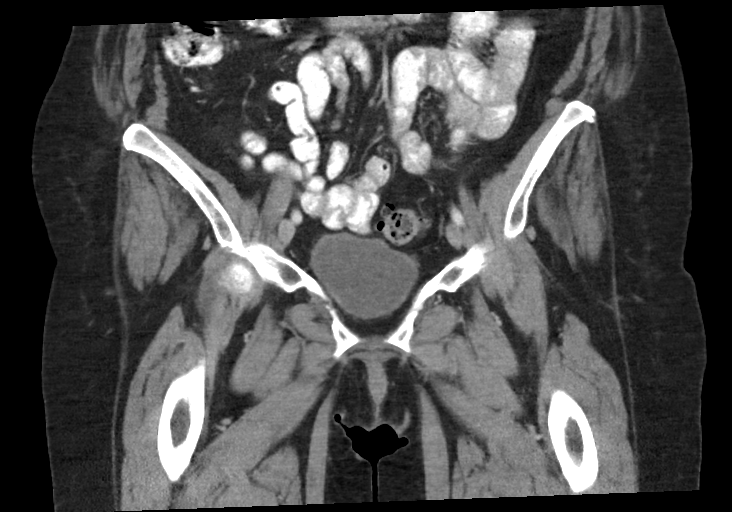
[im 51/92  soft-tissue]
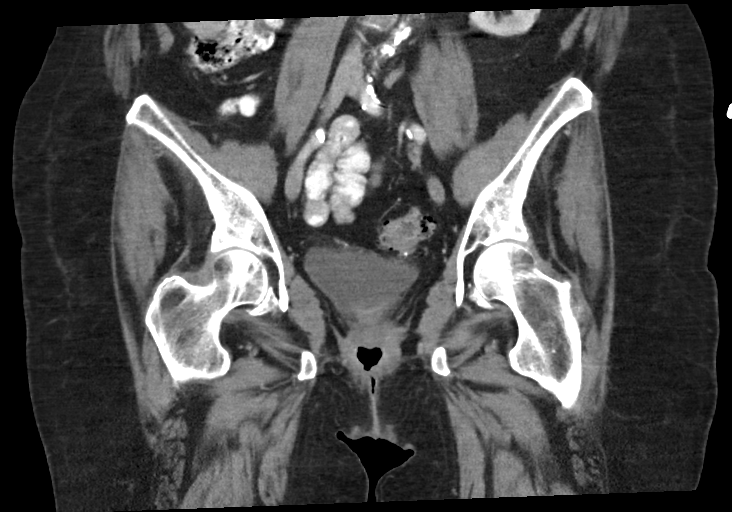

[16 of 46 positions shown; findings below may reference images not displayed]

FINDINGS: Urinary Tract: Visualized portions of the kidneys, ureters, and
bladder are unremarkable.

Bowel: No bowel obstruction or ileus. Distal colonic diverticulosis
without diverticulitis. Normal appendix right lower quadrant. No
bowel wall thickening or inflammatory change.

Vascular/Lymphatic: Extensive atherosclerosis of the aorta and its
distal branches. No pathologic adenopathy.

Reproductive:  Uterus is surgically absent.  No adnexal masses.

Other: No free fluid or free gas. There is a small fat containing
left inguinal hernia. No right-sided hernia. No bowel herniation. No
abnormality to correspond to the reported history of right inguinal
mass.

Musculoskeletal: No acute or destructive bony lesions. Reconstructed
images demonstrate prominent spondylosis at L4-5.
IMPRESSION: 1. No acute intrapelvic process.
2. Small fat containing left inguinal hernia.  No bowel herniation.
3. Distal colonic diverticulosis without diverticulitis.
4.  Aortic Atherosclerosis ([id number]).

## 2021-07-17 DIAGNOSIS — F411 Generalized anxiety disorder: Secondary | ICD-10-CM | POA: Diagnosis not present

## 2021-07-17 DIAGNOSIS — F5105 Insomnia due to other mental disorder: Secondary | ICD-10-CM | POA: Diagnosis not present

## 2021-07-17 DIAGNOSIS — F331 Major depressive disorder, recurrent, moderate: Secondary | ICD-10-CM | POA: Diagnosis not present

## 2021-08-13 DIAGNOSIS — Z79899 Other long term (current) drug therapy: Secondary | ICD-10-CM | POA: Diagnosis not present

## 2021-08-13 DIAGNOSIS — Z961 Presence of intraocular lens: Secondary | ICD-10-CM | POA: Diagnosis not present

## 2021-08-13 DIAGNOSIS — M0579 Rheumatoid arthritis with rheumatoid factor of multiple sites without organ or systems involvement: Secondary | ICD-10-CM | POA: Diagnosis not present

## 2021-08-21 DIAGNOSIS — Z79899 Other long term (current) drug therapy: Secondary | ICD-10-CM | POA: Diagnosis not present

## 2021-08-21 DIAGNOSIS — S63111A Subluxation of metacarpophalangeal joint of right thumb, initial encounter: Secondary | ICD-10-CM | POA: Diagnosis not present

## 2021-08-21 DIAGNOSIS — M0579 Rheumatoid arthritis with rheumatoid factor of multiple sites without organ or systems involvement: Secondary | ICD-10-CM | POA: Diagnosis not present

## 2021-08-21 DIAGNOSIS — S63112A Subluxation of metacarpophalangeal joint of left thumb, initial encounter: Secondary | ICD-10-CM | POA: Diagnosis not present

## 2021-10-01 ENCOUNTER — Ambulatory Visit: Payer: PPO | Admitting: Cardiology

## 2021-10-08 DIAGNOSIS — M4186 Other forms of scoliosis, lumbar region: Secondary | ICD-10-CM | POA: Diagnosis not present

## 2021-10-08 DIAGNOSIS — J9611 Chronic respiratory failure with hypoxia: Secondary | ICD-10-CM | POA: Diagnosis not present

## 2021-10-08 DIAGNOSIS — I5032 Chronic diastolic (congestive) heart failure: Secondary | ICD-10-CM | POA: Diagnosis not present

## 2021-10-08 DIAGNOSIS — F3341 Major depressive disorder, recurrent, in partial remission: Secondary | ICD-10-CM | POA: Diagnosis not present

## 2021-10-08 DIAGNOSIS — E034 Atrophy of thyroid (acquired): Secondary | ICD-10-CM | POA: Diagnosis not present

## 2021-10-08 DIAGNOSIS — R1084 Generalized abdominal pain: Secondary | ICD-10-CM | POA: Diagnosis not present

## 2021-10-08 DIAGNOSIS — R7303 Prediabetes: Secondary | ICD-10-CM | POA: Diagnosis not present

## 2021-10-08 DIAGNOSIS — M5136 Other intervertebral disc degeneration, lumbar region: Secondary | ICD-10-CM | POA: Diagnosis not present

## 2021-10-08 DIAGNOSIS — M542 Cervicalgia: Secondary | ICD-10-CM | POA: Diagnosis not present

## 2021-10-08 DIAGNOSIS — J8410 Pulmonary fibrosis, unspecified: Secondary | ICD-10-CM | POA: Diagnosis not present

## 2021-10-08 DIAGNOSIS — D692 Other nonthrombocytopenic purpura: Secondary | ICD-10-CM | POA: Diagnosis not present

## 2021-10-08 DIAGNOSIS — D329 Benign neoplasm of meninges, unspecified: Secondary | ICD-10-CM | POA: Diagnosis not present

## 2021-10-08 DIAGNOSIS — G4733 Obstructive sleep apnea (adult) (pediatric): Secondary | ICD-10-CM | POA: Diagnosis not present

## 2021-10-08 DIAGNOSIS — J439 Emphysema, unspecified: Secondary | ICD-10-CM | POA: Diagnosis not present

## 2021-10-08 DIAGNOSIS — I779 Disorder of arteries and arterioles, unspecified: Secondary | ICD-10-CM | POA: Diagnosis not present

## 2021-10-08 DIAGNOSIS — D849 Immunodeficiency, unspecified: Secondary | ICD-10-CM | POA: Diagnosis not present

## 2021-10-08 DIAGNOSIS — E7849 Other hyperlipidemia: Secondary | ICD-10-CM | POA: Diagnosis not present

## 2021-10-09 DIAGNOSIS — I779 Disorder of arteries and arterioles, unspecified: Secondary | ICD-10-CM | POA: Diagnosis not present

## 2021-10-09 DIAGNOSIS — D849 Immunodeficiency, unspecified: Secondary | ICD-10-CM | POA: Diagnosis not present

## 2021-10-09 DIAGNOSIS — D692 Other nonthrombocytopenic purpura: Secondary | ICD-10-CM | POA: Diagnosis not present

## 2021-10-09 DIAGNOSIS — I5032 Chronic diastolic (congestive) heart failure: Secondary | ICD-10-CM | POA: Diagnosis not present

## 2021-10-09 DIAGNOSIS — G4733 Obstructive sleep apnea (adult) (pediatric): Secondary | ICD-10-CM | POA: Diagnosis not present

## 2021-10-09 DIAGNOSIS — R7303 Prediabetes: Secondary | ICD-10-CM | POA: Diagnosis not present

## 2021-10-09 DIAGNOSIS — J8410 Pulmonary fibrosis, unspecified: Secondary | ICD-10-CM | POA: Diagnosis not present

## 2021-10-09 DIAGNOSIS — F3341 Major depressive disorder, recurrent, in partial remission: Secondary | ICD-10-CM | POA: Diagnosis not present

## 2021-10-09 DIAGNOSIS — E034 Atrophy of thyroid (acquired): Secondary | ICD-10-CM | POA: Diagnosis not present

## 2021-10-09 DIAGNOSIS — D329 Benign neoplasm of meninges, unspecified: Secondary | ICD-10-CM | POA: Diagnosis not present

## 2021-10-09 DIAGNOSIS — J9611 Chronic respiratory failure with hypoxia: Secondary | ICD-10-CM | POA: Diagnosis not present

## 2021-10-09 DIAGNOSIS — E7849 Other hyperlipidemia: Secondary | ICD-10-CM | POA: Diagnosis not present

## 2021-10-12 ENCOUNTER — Ambulatory Visit: Payer: PPO | Admitting: Cardiology

## 2021-10-16 ENCOUNTER — Ambulatory Visit: Payer: PPO | Admitting: Occupational Therapy

## 2021-10-29 ENCOUNTER — Ambulatory Visit: Payer: PPO | Admitting: Occupational Therapy

## 2021-11-07 ENCOUNTER — Ambulatory Visit: Payer: PPO | Attending: Internal Medicine | Admitting: Occupational Therapy

## 2021-11-12 DIAGNOSIS — F5105 Insomnia due to other mental disorder: Secondary | ICD-10-CM | POA: Diagnosis not present

## 2021-11-12 DIAGNOSIS — F3341 Major depressive disorder, recurrent, in partial remission: Secondary | ICD-10-CM | POA: Diagnosis not present

## 2021-11-12 DIAGNOSIS — E034 Atrophy of thyroid (acquired): Secondary | ICD-10-CM | POA: Diagnosis not present

## 2021-11-12 DIAGNOSIS — F411 Generalized anxiety disorder: Secondary | ICD-10-CM | POA: Diagnosis not present

## 2021-11-12 DIAGNOSIS — D849 Immunodeficiency, unspecified: Secondary | ICD-10-CM | POA: Diagnosis not present

## 2021-11-12 DIAGNOSIS — F331 Major depressive disorder, recurrent, moderate: Secondary | ICD-10-CM | POA: Diagnosis not present

## 2021-11-12 DIAGNOSIS — R7303 Prediabetes: Secondary | ICD-10-CM | POA: Diagnosis not present

## 2021-11-12 DIAGNOSIS — G4733 Obstructive sleep apnea (adult) (pediatric): Secondary | ICD-10-CM | POA: Diagnosis not present

## 2021-11-12 DIAGNOSIS — J9611 Chronic respiratory failure with hypoxia: Secondary | ICD-10-CM | POA: Diagnosis not present

## 2021-11-12 DIAGNOSIS — I779 Disorder of arteries and arterioles, unspecified: Secondary | ICD-10-CM | POA: Diagnosis not present

## 2021-11-12 DIAGNOSIS — I5032 Chronic diastolic (congestive) heart failure: Secondary | ICD-10-CM | POA: Diagnosis not present

## 2021-11-12 DIAGNOSIS — E7849 Other hyperlipidemia: Secondary | ICD-10-CM | POA: Diagnosis not present

## 2021-11-12 DIAGNOSIS — R1084 Generalized abdominal pain: Secondary | ICD-10-CM | POA: Diagnosis not present

## 2021-11-12 DIAGNOSIS — D329 Benign neoplasm of meninges, unspecified: Secondary | ICD-10-CM | POA: Diagnosis not present

## 2021-11-12 DIAGNOSIS — J8410 Pulmonary fibrosis, unspecified: Secondary | ICD-10-CM | POA: Diagnosis not present

## 2021-11-14 ENCOUNTER — Other Ambulatory Visit (HOSPITAL_COMMUNITY): Payer: Self-pay | Admitting: Internal Medicine

## 2021-11-15 ENCOUNTER — Other Ambulatory Visit: Payer: Self-pay | Admitting: Internal Medicine

## 2021-11-15 DIAGNOSIS — R1084 Generalized abdominal pain: Secondary | ICD-10-CM

## 2021-11-23 ENCOUNTER — Ambulatory Visit
Admission: RE | Admit: 2021-11-23 | Discharge: 2021-11-23 | Disposition: A | Discharge status: Home / Self Care | Payer: PPO | Source: Ambulatory Visit | Attending: Internal Medicine | Admitting: Internal Medicine

## 2021-11-23 DIAGNOSIS — R1084 Generalized abdominal pain: Secondary | ICD-10-CM | POA: Insufficient documentation

## 2021-11-23 DIAGNOSIS — N281 Cyst of kidney, acquired: Secondary | ICD-10-CM | POA: Diagnosis not present

## 2021-11-23 DIAGNOSIS — K7689 Other specified diseases of liver: Secondary | ICD-10-CM | POA: Diagnosis not present

## 2021-11-23 DIAGNOSIS — N210 Calculus in bladder: Secondary | ICD-10-CM | POA: Diagnosis not present

## 2021-11-23 DIAGNOSIS — K8020 Calculus of gallbladder without cholecystitis without obstruction: Secondary | ICD-10-CM | POA: Diagnosis not present

## 2021-11-23 MED ORDER — IOHEXOL 300 MG/ML  SOLN
100.0000 mL | Freq: Once | INTRAMUSCULAR | Status: AC | PRN
  Administered 2021-11-23: 100 mL via INTRAVENOUS

## 2021-12-13 ENCOUNTER — Encounter: Payer: Self-pay | Admitting: Urology

## 2021-12-13 ENCOUNTER — Ambulatory Visit: Payer: PPO | Admitting: Urology

## 2021-12-13 VITALS — BP 150/83 | HR 78 | Ht 65.0 in | Wt 172.0 lb

## 2021-12-13 DIAGNOSIS — N210 Calculus in bladder: Secondary | ICD-10-CM

## 2021-12-13 DIAGNOSIS — N3289 Other specified disorders of bladder: Secondary | ICD-10-CM | POA: Diagnosis not present

## 2021-12-13 DIAGNOSIS — R110 Nausea: Secondary | ICD-10-CM | POA: Diagnosis not present

## 2021-12-13 DIAGNOSIS — N3000 Acute cystitis without hematuria: Secondary | ICD-10-CM

## 2021-12-13 DIAGNOSIS — R1030 Lower abdominal pain, unspecified: Secondary | ICD-10-CM | POA: Diagnosis not present

## 2021-12-13 LAB — MICROSCOPIC EXAMINATION

## 2021-12-13 LAB — URINALYSIS, COMPLETE
Bilirubin, UA: NEGATIVE
Glucose, UA: NEGATIVE
Ketones, UA: NEGATIVE
Nitrite, UA: NEGATIVE
Protein,UA: NEGATIVE
Specific Gravity, UA: 1.02 (ref 1.005–1.030)
Urobilinogen, Ur: 0.2 mg/dL (ref 0.2–1.0)
pH, UA: 6 (ref 5.0–7.5)

## 2021-12-13 NOTE — Progress Notes (Signed)
In and Out Catheterization  Patient is present today for a I & O catheterization due to recurrent UTI. Patient was cleaned and prepped in a sterile fashion with betadine. A 14FR cath was inserted no complications were noted, 27m of urine return was noted, urine was pale yellow in color. A clean urine sample was collected for urinalysis and urine culture. Bladder was drained  And catheter was removed with out difficulty.    Performed by: OGordy ClementCMA   Follow up/ Additional notes: Will call with results

## 2021-12-13 NOTE — Progress Notes (Signed)
   12/13/21 2:32 PM   [name] January 18, 1946 [id number]  CC: Bladder stones, abdominal pain, nausea  HPI: 76 year old comorbid and frail female referred for small bladder stone seen on CT in addition to abdominal pain and nausea over the last few months.  CT abdomen pelvis with contrast dated 11/23/2021 shows some very small stones at the base of the bladder, no hydronephrosis, no evidence of ureteral stones, no other significant abnormalities that would explain her lower abdominal pain and nausea aside from constipation.  She denies any gross hematuria or dysuria.  Urinalysis today is pending.  She denies any urinary complaints aside from frequency and some urgency.  She had a CT pelvis in April 2022 where no stones were seen at that time.  Most recent urinalysis from July 2023 was benign  PMH: Past Medical History:  Diagnosis Date   Collagen vascular disease ([facility])    Depression    Hyperlipidemia    Muscle pain    Neuropathy    RA (rheumatoid arthritis) ([facility])    Thyroid disease    Vitamin B 12 deficiency     Surgical History: Past Surgical History:  Procedure Laterality Date   ABDOMINAL HYSTERECTOMY     BREAST BIOPSY Left    2 benign biopsies   ECTOPIC PREGNANCY SURGERY     KNEE SURGERY Right    LAMINECTOMY     leg vein stripping Bilateral    TONSILLECTOMY     Family History: Family History  Problem Relation Age of Onset   COPD Father    Cancer Father    Breast cancer Cousin    Breast cancer Maternal Aunt    Breast cancer Maternal Aunt     Social History:  reports that she has never smoked. She has never been exposed to tobacco smoke. She has never used smokeless tobacco. No history on file for alcohol use and drug use.  Physical Exam: BP (!) 150/83   Pulse 78   Ht '5\' 5"'$  (1.651 m)   Wt 172 lb (78 kg)   BMI 28.62 kg/m    Constitutional:  Alert and oriented, No acute distress.  Frail-appearing, on oxygen Cardiovascular: No clubbing, cyanosis, or  edema. Respiratory: Normal respiratory effort, no increased work of breathing.  On oxygen GI: Abdomen is soft, nontender, nondistended, no abdominal masses   Laboratory Data: Reviewed  Pertinent Imaging: I have personally viewed and interpreted the CT abdomen and pelvis showing small punctate stones at the base of the bladder, no ureteral stones or hydronephrosis.  Assessment & Plan:   76 year old female with lower abdominal pain and nausea of unclear etiology, potentially secondary to constipation, incidentally found small stones in the bladder on CT abdomen and pelvis with some subtle bladder wall thickening.  I do not think the small stones would be related to her lower abdominal pain or nausea.  We reviewed possible etiologies including UTI, bladder stones from incomplete emptying, spontaneously passed renal stones, and even malignancy with calcification.  I recommended cystoscopy for further evaluation.  Urinalysis and culture today, follow-up results RTC for cystoscopy  Nickolas Madrid, MD 12/13/2021  [facility] [address], [place], [place] [address] [phone]

## 2021-12-17 LAB — CULTURE, URINE COMPREHENSIVE

## 2022-01-03 ENCOUNTER — Encounter: Payer: Self-pay | Admitting: Urology

## 2022-01-03 ENCOUNTER — Ambulatory Visit: Payer: PPO | Admitting: Urology

## 2022-01-03 VITALS — BP 147/88 | HR 87 | Ht 65.0 in | Wt 170.0 lb

## 2022-01-03 DIAGNOSIS — N3000 Acute cystitis without hematuria: Secondary | ICD-10-CM | POA: Diagnosis not present

## 2022-01-03 DIAGNOSIS — R9341 Abnormal radiologic findings on diagnostic imaging of renal pelvis, ureter, or bladder: Secondary | ICD-10-CM

## 2022-01-03 DIAGNOSIS — R300 Dysuria: Secondary | ICD-10-CM | POA: Diagnosis not present

## 2022-01-03 DIAGNOSIS — N3289 Other specified disorders of bladder: Secondary | ICD-10-CM

## 2022-01-03 MED ORDER — CEPHALEXIN 250 MG PO CAPS
500.0000 mg | ORAL_CAPSULE | Freq: Once | ORAL | Status: AC
  Administered 2022-01-03: 500 mg via ORAL

## 2022-01-03 MED ORDER — CEPHALEXIN 500 MG PO CAPS: 500.0000 mg | ORAL_CAPSULE | Freq: Two times a day (BID) | ORAL | 0 refills | Status: AC

## 2022-01-03 NOTE — Progress Notes (Signed)
Cystoscopy Procedure Note:  Indication: Bladder wall thickening, possible calcifications on CT  Keflex given for prophylaxis  After informed consent and discussion of the procedure and its risks, AYAN YANKEY was positioned and prepped in the standard fashion. Cystoscopy was performed with a flexible cystoscope. The urethra, bladder neck and entire bladder was visualized in a standard fashion. The ureteral orifices were visualized in their normal location and orientation.  Vision was somewhat limited by cloudy urine.  Thorough cystoscopy performed and no definite lesions.  Findings: Cystoscopy limited by cloudy vision, no tumor seen  Assessment and Plan: -Call with cytology and culture results -Will treat for possible UTI with her intermittent dysuria despite prior negative culture with Keflex 500 mg twice daily x5 days  Nickolas Madrid, MD 01/03/2022

## 2022-01-03 NOTE — Addendum Note (Signed)
Addended by: Tyrone Apple on: 01/03/2022 03:49 PM   Modules accepted: Orders

## 2022-01-03 NOTE — Addendum Note (Signed)
Addended by: Despina Hidden on: 01/03/2022 04:01 PM   Modules accepted: Orders

## 2022-01-04 LAB — URINALYSIS, COMPLETE
Bilirubin, UA: NEGATIVE
Glucose, UA: NEGATIVE
Ketones, UA: NEGATIVE
Nitrite, UA: NEGATIVE
Protein,UA: NEGATIVE
RBC, UA: NEGATIVE
Specific Gravity, UA: 1.02 (ref 1.005–1.030)
Urobilinogen, Ur: 0.2 mg/dL (ref 0.2–1.0)
pH, UA: 7.5 (ref 5.0–7.5)

## 2022-01-04 LAB — MICROSCOPIC EXAMINATION

## 2022-01-06 LAB — CULTURE, URINE COMPREHENSIVE

## 2022-01-07 LAB — CYTOLOGY - NON PAP

## 2022-01-10 LAB — MYCOPLASMA / UREAPLASMA CULTURE
Mycoplasma hominis Culture: NEGATIVE
Ureaplasma urealyticum: NEGATIVE

## 2022-01-14 ENCOUNTER — Ambulatory Visit: Payer: PPO | Admitting: Urology

## 2022-01-14 ENCOUNTER — Encounter: Payer: Self-pay | Admitting: Urology

## 2022-01-14 VITALS — BP 138/83 | HR 76 | Ht 65.0 in

## 2022-01-14 DIAGNOSIS — N3000 Acute cystitis without hematuria: Secondary | ICD-10-CM | POA: Diagnosis not present

## 2022-01-14 DIAGNOSIS — R3989 Other symptoms and signs involving the genitourinary system: Secondary | ICD-10-CM

## 2022-01-14 DIAGNOSIS — R350 Frequency of micturition: Secondary | ICD-10-CM | POA: Diagnosis not present

## 2022-01-14 DIAGNOSIS — R309 Painful micturition, unspecified: Secondary | ICD-10-CM | POA: Diagnosis not present

## 2022-01-14 DIAGNOSIS — R1030 Lower abdominal pain, unspecified: Secondary | ICD-10-CM | POA: Diagnosis not present

## 2022-01-14 DIAGNOSIS — R8289 Other abnormal findings on cytological and histological examination of urine: Secondary | ICD-10-CM | POA: Diagnosis not present

## 2022-01-14 LAB — URINALYSIS, COMPLETE
Bilirubin, UA: NEGATIVE
Glucose, UA: NEGATIVE
Ketones, UA: NEGATIVE
Nitrite, UA: NEGATIVE
Specific Gravity, UA: 1.025 (ref 1.005–1.030)
Urobilinogen, Ur: 1 mg/dL (ref 0.2–1.0)
pH, UA: 6 (ref 5.0–7.5)

## 2022-01-14 LAB — MICROSCOPIC EXAMINATION: WBC, UA: >30 /hpf — AB (ref 0–5)

## 2022-01-14 MED ORDER — NITROFURANTOIN MONOHYD MACRO 100 MG PO CAPS: 100.0000 mg | ORAL_CAPSULE | Freq: Two times a day (BID) | ORAL | 0 refills | Status: DC

## 2022-01-14 NOTE — Progress Notes (Signed)
01/14/2022 9:31 PM   [name] December 04, 1945 [id number]  Referring provider: Adin Hector, MD [place] [facility] [place],   [address]  Urological history: 1. Bladder stone -CT (10/2021) - 3 mm and 4 mm stones in the right posterior bladder lumen -cysto (12/2021) - cloudy urine -urine cytology (12/2021) - negative for high-grade urothelial carcinoma  Chief Complaint  Patient presents with   Dysuria    HPI: [name] is a 76 y.o. female who presents today for burning with her husband, [name].   She states that she started to experience intense burning and frequency associated w/ lower abdominal pain.  Patient denies any modifying or aggravating factors.  Patient denies any gross hematuria or suprapubic/flank pain.  Patient denies any fevers, chills, nausea or vomiting.    UA yellow cloudy, SG 1.025, pH 6.0, +2 leukocytes, +protein, 1+RBC's, >30 WBC's and many bacteria  PMH: Past Medical History:  Diagnosis Date   Collagen vascular disease ([place])    Depression    Hyperlipidemia    Muscle pain    Neuropathy    RA (rheumatoid arthritis) ([place])    Thyroid disease    Vitamin B 12 deficiency     Surgical History: Past Surgical History:  Procedure Laterality Date   ABDOMINAL HYSTERECTOMY     BREAST BIOPSY Left    2 benign biopsies   ECTOPIC PREGNANCY SURGERY     KNEE SURGERY Right    LAMINECTOMY     leg vein stripping Bilateral    TONSILLECTOMY      Home Medications:  Allergies as of 01/14/2022       Reactions   Codeine Nausea And Vomiting, Other (See Comments)   Demerol [meperidine] Nausea And Vomiting        Medication List        Accurate as of January 14, 2022  9:31 PM. If you have any questions, ask your nurse or doctor.          Adalimumab 40 MG/0.8ML Pskt Inject 40 mg into the skin every 14 (fourteen) days.   albuterol 108 (90 Base) MCG/ACT inhaler Commonly known as: VENTOLIN HFA Inhale 1-2 puffs  into the lungs every 6 (six) hours as needed for wheezing or shortness of breath.   aspirin EC 81 MG tablet Take 81 mg by mouth daily.   atorvastatin 40 MG tablet Commonly known as: LIPITOR Take 1 tablet (40 mg total) by mouth daily at 6 PM.   azithromycin 250 MG tablet Commonly known as: ZITHROMAX Take 250 mg by mouth every Monday, Wednesday, and Friday.   clonazePAM 0.5 MG tablet Commonly known as: KLONOPIN Take 0.5 mg by mouth 2 (two) times daily as needed for anxiety.   clopidogrel 75 MG tablet Commonly known as: PLAVIX Take 1 tablet (75 mg total) by mouth daily.   folic acid 1 MG tablet Commonly known as: FOLVITE Take 1 mg by mouth daily.   ipratropium-albuterol 0.5-2.5 (3) MG/3ML Soln Commonly known as: DUONEB Inhale 3 mLs into the lungs 2 (two) times daily as needed (wheezing or shortness of breath).   lamoTRIgine 100 MG tablet Commonly known as: LAMICTAL Take 100 mg by mouth daily.   levothyroxine 112 MCG tablet Commonly known as: SYNTHROID Take 1 tablet (112 mcg total) by mouth daily.   methotrexate 2.5 MG tablet Commonly known as: RHEUMATREX Take 20 mg by mouth every Saturday.   mirtazapine 15 MG tablet Commonly known as: REMERON Take 15 mg  by mouth at bedtime.   nitrofurantoin (macrocrystal-monohydrate) 100 MG capsule Commonly known as: MACROBID Take 1 capsule (100 mg total) by mouth every 12 (twelve) hours. Started by: Zara Council, PA-C   pantoprazole 40 MG tablet Commonly known as: PROTONIX Take 40 mg by mouth.   potassium chloride 8 MEQ tablet Commonly known as: KLOR-CON Take 8 mEq by mouth daily.   predniSONE 5 MG tablet Commonly known as: DELTASONE Take 2.5 mg by mouth daily.   torsemide 20 MG tablet Commonly known as: DEMADEX Take 2 tablets (40 mg total) by mouth daily.   venlafaxine XR 150 MG 24 hr capsule Commonly known as: EFFEXOR-XR Take 150 mg by mouth daily.   Vitamin D (Ergocalciferol) 1.25 MG (50000 UNIT) Caps  capsule Commonly known as: DRISDOL Take 50,000 Int'l Units by mouth every 7 (seven) days.        Allergies:  Allergies  Allergen Reactions   Codeine Nausea And Vomiting and Other (See Comments)   Demerol [Meperidine] Nausea And Vomiting    Family History: Family History  Problem Relation Age of Onset   COPD Father    Cancer Father    Breast cancer Cousin    Breast cancer Maternal Aunt    Breast cancer Maternal Aunt     Social History:  reports that she has never smoked. She has never been exposed to tobacco smoke. She has never used smokeless tobacco. No history on file for alcohol use and drug use.  ROS: Pertinent ROS in HPI  Physical Exam: BP 138/83   Pulse 76   Ht '5\' 5"'$  (1.651 m)   BMI 28.29 kg/m   Constitutional:  Well nourished. Alert and oriented, No acute distress. HEENT: [place] AT, moist mucus membranes.  Trachea midline Cardiovascular: No clubbing, cyanosis, or edema. Respiratory: Normal respiratory effort, no increased work of breathing. Neurologic: Grossly intact, no focal deficits, moving all 4 extremities. In wheelchair Psychiatric: Normal mood and affect.    Laboratory Data: Urinalysis Component     Latest Ref Rng 01/14/2022  Specific Gravity, UA     1.005 - 1.030  1.025   pH, UA     5.0 - 7.5  6.0   Color, UA     Yellow  Yellow   Appearance Ur     Clear  Cloudy !   Leukocytes,UA     Negative  2+ !   Protein,UA     Negative/Trace  2+ !   Glucose, UA     Negative  Negative   Ketones, UA     Negative  Negative   RBC, UA     Negative  1+ !   Bilirubin, UA     Negative  Negative   Urobilinogen, Ur     0.2 - 1.0 mg/dL 1.0   Nitrite, UA     Negative  Negative   Microscopic Examination See below:     Legend: ! Abnormal  Component     Latest Ref Rng 01/14/2022  WBC, UA     0 - 5 /hpf >30 !   RBC, Urine     0 - 2 /hpf 0-2   Epithelial Cells (non renal)     0 - 10 /hpf 0-10   Bacteria, UA     None seen/Few  Many !     Legend: !  Abnormal I have reviewed the labs.   Pertinent Imaging: N/A  Assessment & Plan:    1. Suspected UTI -UA grossly infected -urine culture pending -started  on Macrobid 100 mg, twice daily for seven days   Return in about 1 month (around 02/14/2022) for PVR and OAB questionnaire.  These notes generated with voice recognition software. I apologize for typographical errors.  [place], [place] [address]  [place], [place] [address] [phone]

## 2022-01-17 ENCOUNTER — Telehealth: Payer: Self-pay

## 2022-01-17 LAB — CULTURE, URINE COMPREHENSIVE

## 2022-01-17 NOTE — Telephone Encounter (Signed)
-----   Message from Nori Riis, PA-C sent at 01/17/2022  1:55 PM EDT ----- Please let [name] know that her urine culture has resulted and it is positive for infection.  The nitrofurantoin (Macrobid ) is the correct antibiotic.

## 2022-01-17 NOTE — Telephone Encounter (Signed)
Notified pt as advised, pt expressed understanding.  ?

## 2022-01-23 ENCOUNTER — Telehealth: Payer: Self-pay

## 2022-01-23 NOTE — Telephone Encounter (Signed)
Pt states she would be able to come to [facility] on Monday.

## 2022-01-23 NOTE — Telephone Encounter (Signed)
Patient scheduled for [place] on Monday, pt confirmed.

## 2022-01-23 NOTE — Telephone Encounter (Signed)
Incoming call on triage line from pt stating she has one more dose of her Abx left and she is still burning. Pt states she is not anywhere near the discomfort she experienced prior to receiving abx. Pt is wondering if she needs her Abx extended. She states she has no transportation so she will not be able to come in for appt or drop off a urine.

## 2022-01-25 NOTE — Progress Notes (Deleted)
01/28/2022 3:53 PM   [name] 11/06/1945 [id number]  Referring provider: Adin Hector, MD [facility] [place],  [place] [address]  Urological history: 1. Bladder stone -CT (10/2021) - 3 mm and 4 mm stones in the right posterior bladder lumen -cysto (12/2021) - cloudy urine -urine cytology (12/2021) - negative for high-grade urothelial carcinoma  No chief complaint on file.   HPI: [name] is a 76 y.o. female who presents today for burning with her husband, [name].   At her visit on 01/14/2022, she stated that she started to experience intense burning and frequency associated w/ lower abdominal pain.  UA yellow cloudy, SG 1.025, pH 6.0, +2 leukocytes, +protein, 1+RBC's, >30 WBC's and many bacteria.  Urine culture was positive for citrobacter freundii.  She was treated with seven days of culture appropriate antibiotics.    She presents today stating she is still experiencing ***  CATH UA ***  PMH: Past Medical History:  Diagnosis Date   Collagen vascular disease (HCC)    Depression    Hyperlipidemia    Muscle pain    Neuropathy    RA (rheumatoid arthritis) ([place])    Thyroid disease    Vitamin B 12 deficiency     Surgical History: Past Surgical History:  Procedure Laterality Date   ABDOMINAL HYSTERECTOMY     BREAST BIOPSY Left    2 benign biopsies   ECTOPIC PREGNANCY SURGERY     KNEE SURGERY Right    LAMINECTOMY     leg vein stripping Bilateral    TONSILLECTOMY      Home Medications:  Allergies as of 01/28/2022       Reactions   Codeine Nausea And Vomiting, Other (See Comments)   Demerol [meperidine] Nausea And Vomiting        Medication List        Accurate as of January 25, 2022  3:53 PM. If you have any questions, ask your nurse or doctor.          Adalimumab 40 MG/0.8ML Pskt Inject 40 mg into the skin every 14 (fourteen) days.   albuterol 108 (90 Base) MCG/ACT inhaler Commonly known as:  VENTOLIN HFA Inhale 1-2 puffs into the lungs every 6 (six) hours as needed for wheezing or shortness of breath.   aspirin EC 81 MG tablet Take 81 mg by mouth daily.   atorvastatin 40 MG tablet Commonly known as: LIPITOR Take 1 tablet (40 mg total) by mouth daily at 6 PM.   azithromycin 250 MG tablet Commonly known as: ZITHROMAX Take 250 mg by mouth every Monday, Wednesday, and Friday.   clonazePAM 0.5 MG tablet Commonly known as: KLONOPIN Take 0.5 mg by mouth 2 (two) times daily as needed for anxiety.   clopidogrel 75 MG tablet Commonly known as: PLAVIX Take 1 tablet (75 mg total) by mouth daily.   folic acid 1 MG tablet Commonly known as: FOLVITE Take 1 mg by mouth daily.   ipratropium-albuterol 0.5-2.5 (3) MG/3ML Soln Commonly known as: DUONEB Inhale 3 mLs into the lungs 2 (two) times daily as needed (wheezing or shortness of breath).   lamoTRIgine 100 MG tablet Commonly known as: LAMICTAL Take 100 mg by mouth daily.   levothyroxine 112 MCG tablet Commonly known as: SYNTHROID Take 1 tablet (112 mcg total) by mouth daily.   methotrexate 2.5 MG tablet Commonly known as: RHEUMATREX Take 20 mg by mouth every Saturday.   mirtazapine 15 MG tablet  Commonly known as: REMERON Take 15 mg by mouth at bedtime.   nitrofurantoin (macrocrystal-monohydrate) 100 MG capsule Commonly known as: MACROBID Take 1 capsule (100 mg total) by mouth every 12 (twelve) hours.   pantoprazole 40 MG tablet Commonly known as: PROTONIX Take 40 mg by mouth.   potassium chloride 8 MEQ tablet Commonly known as: KLOR-CON Take 8 mEq by mouth daily.   predniSONE 5 MG tablet Commonly known as: DELTASONE Take 2.5 mg by mouth daily.   torsemide 20 MG tablet Commonly known as: DEMADEX Take 2 tablets (40 mg total) by mouth daily.   venlafaxine XR 150 MG 24 hr capsule Commonly known as: EFFEXOR-XR Take 150 mg by mouth daily.   Vitamin D (Ergocalciferol) 1.25 MG (50000 UNIT) Caps  capsule Commonly known as: DRISDOL Take 50,000 Int'l Units by mouth every 7 (seven) days.        Allergies:  Allergies  Allergen Reactions   Codeine Nausea And Vomiting and Other (See Comments)   Demerol [Meperidine] Nausea And Vomiting    Family History: Family History  Problem Relation Age of Onset   COPD Father    Cancer Father    Breast cancer Cousin    Breast cancer Maternal Aunt    Breast cancer Maternal Aunt     Social History:  reports that she has never smoked. She has never been exposed to tobacco smoke. She has never used smokeless tobacco. No history on file for alcohol use and drug use.  ROS: Pertinent ROS in HPI  Physical Exam: There were no vitals taken for this visit.  Constitutional:  Well nourished. Alert and oriented, No acute distress. HEENT: [place] AT, moist mucus membranes.  Trachea midline, no masses. Cardiovascular: No clubbing, cyanosis, or edema. Respiratory: Normal respiratory effort, no increased work of breathing. GU: No CVA tenderness.  No bladder fullness or masses. Vulvovaginal atrophy w/ pallor, loss of rugae, introital retraction, excoriations.  Vulvar thinning, fusion of labia, clitoral hood retraction, prominent urethral meatus.   *** external genitalia, *** pubic hair distribution, no lesions.  Normal urethral meatus, no lesions, no prolapse, no discharge.   No urethral masses, tenderness and/or tenderness. No bladder fullness, tenderness or masses. *** vagina mucosa, *** estrogen effect, no discharge, no lesions, *** pelvic support, *** cystocele and *** rectocele noted.  No cervical motion tenderness.  Uterus is freely mobile and non-fixed.  No adnexal/parametria masses or tenderness noted.  Anus and perineum are without rashes or lesions.   ***  Neurologic: Grossly intact, no focal deficits, moving all 4 extremities. Psychiatric: Normal mood and affect.    Laboratory Data: Urinalysis CATH UA  *** I have reviewed the labs.   Pertinent  Imaging: N/A  Assessment & Plan:    1. Suspected UTI -UA grossly infected -urine culture pending -started on Macrobid 100 mg, twice daily for seven days   No follow-ups on file.  These notes generated with voice recognition software. I apologize for typographical errors.  [place], [place] [address]  [place], [place] [address] [phone]

## 2022-01-28 ENCOUNTER — Other Ambulatory Visit: Payer: Self-pay

## 2022-01-28 ENCOUNTER — Ambulatory Visit: Payer: PPO | Admitting: Urology

## 2022-01-28 DIAGNOSIS — N3000 Acute cystitis without hematuria: Secondary | ICD-10-CM

## 2022-01-28 DIAGNOSIS — R3989 Other symptoms and signs involving the genitourinary system: Secondary | ICD-10-CM

## 2022-02-04 NOTE — Progress Notes (Deleted)
02/05/2022 4:27 PM   [name] Feb 25, 1946 [id number]  Referring provider: Adin Hector, MD [facility] [place],  [place] [address]  Urological history: 1. Bladder stone -CT (10/2021) - 3 mm and 4 mm stones in the right posterior bladder lumen -cysto (12/2021) - cloudy urine -urine cytology (12/2021) - negative for high-grade urothelial carcinoma  No chief complaint on file.   HPI: ADRINNE SZE is a 76 y.o. female who presents today for burning with her husband, [name].   At her visit on 01/14/2022, she stated that she started to experience intense burning and frequency associated w/ lower abdominal pain.  UA yellow cloudy, SG 1.025, pH 6.0, +2 leukocytes, +protein, 1+RBC's, >30 WBC's and many bacteria.  Urine culture was positive for citrobacter freundii.  She was treated with seven days of culture appropriate antibiotics.    She presents today stating she is still experiencing ***  CATH UA ***  PMH: Past Medical History:  Diagnosis Date   Collagen vascular disease (HCC)    Depression    Hyperlipidemia    Muscle pain    Neuropathy    RA (rheumatoid arthritis) ([place])    Thyroid disease    Vitamin B 12 deficiency     Surgical History: Past Surgical History:  Procedure Laterality Date   ABDOMINAL HYSTERECTOMY     BREAST BIOPSY Left    2 benign biopsies   ECTOPIC PREGNANCY SURGERY     KNEE SURGERY Right    LAMINECTOMY     leg vein stripping Bilateral    TONSILLECTOMY      Home Medications:  Allergies as of 02/05/2022       Reactions   Codeine Nausea And Vomiting, Other (See Comments)   Demerol [meperidine] Nausea And Vomiting        Medication List        Accurate as of February 04, 2022  4:27 PM. If you have any questions, ask your nurse or doctor.          Adalimumab 40 MG/0.8ML Pskt Inject 40 mg into the skin every 14 (fourteen) days.   albuterol 108 (90 Base) MCG/ACT inhaler Commonly known as:  VENTOLIN HFA Inhale 1-2 puffs into the lungs every 6 (six) hours as needed for wheezing or shortness of breath.   aspirin EC 81 MG tablet Take 81 mg by mouth daily.   atorvastatin 40 MG tablet Commonly known as: LIPITOR Take 1 tablet (40 mg total) by mouth daily at 6 PM.   azithromycin 250 MG tablet Commonly known as: ZITHROMAX Take 250 mg by mouth every Monday, Wednesday, and Friday.   clonazePAM 0.5 MG tablet Commonly known as: KLONOPIN Take 0.5 mg by mouth 2 (two) times daily as needed for anxiety.   clopidogrel 75 MG tablet Commonly known as: PLAVIX Take 1 tablet (75 mg total) by mouth daily.   folic acid 1 MG tablet Commonly known as: FOLVITE Take 1 mg by mouth daily.   ipratropium-albuterol 0.5-2.5 (3) MG/3ML Soln Commonly known as: DUONEB Inhale 3 mLs into the lungs 2 (two) times daily as needed (wheezing or shortness of breath).   lamoTRIgine 100 MG tablet Commonly known as: LAMICTAL Take 100 mg by mouth daily.   levothyroxine 112 MCG tablet Commonly known as: SYNTHROID Take 1 tablet (112 mcg total) by mouth daily.   methotrexate 2.5 MG tablet Commonly known as: RHEUMATREX Take 20 mg by mouth every Saturday.   mirtazapine 15 MG tablet  Commonly known as: REMERON Take 15 mg by mouth at bedtime.   nitrofurantoin (macrocrystal-monohydrate) 100 MG capsule Commonly known as: MACROBID Take 1 capsule (100 mg total) by mouth every 12 (twelve) hours.   pantoprazole 40 MG tablet Commonly known as: PROTONIX Take 40 mg by mouth.   potassium chloride 8 MEQ tablet Commonly known as: KLOR-CON Take 8 mEq by mouth daily.   predniSONE 5 MG tablet Commonly known as: DELTASONE Take 2.5 mg by mouth daily.   torsemide 20 MG tablet Commonly known as: DEMADEX Take 2 tablets (40 mg total) by mouth daily.   venlafaxine XR 150 MG 24 hr capsule Commonly known as: EFFEXOR-XR Take 150 mg by mouth daily.   Vitamin D (Ergocalciferol) 1.25 MG (50000 UNIT) Caps  capsule Commonly known as: DRISDOL Take 50,000 Int'l Units by mouth every 7 (seven) days.        Allergies:  Allergies  Allergen Reactions   Codeine Nausea And Vomiting and Other (See Comments)   Demerol [Meperidine] Nausea And Vomiting    Family History: Family History  Problem Relation Age of Onset   COPD Father    Cancer Father    Breast cancer Cousin    Breast cancer Maternal Aunt    Breast cancer Maternal Aunt     Social History:  reports that she has never smoked. She has never been exposed to tobacco smoke. She has never used smokeless tobacco. No history on file for alcohol use and drug use.  ROS: Pertinent ROS in HPI  Physical Exam: There were no vitals taken for this visit.  Constitutional:  Well nourished. Alert and oriented, No acute distress. HEENT: [place] AT, moist mucus membranes.  Trachea midline, no masses. Cardiovascular: No clubbing, cyanosis, or edema. Respiratory: Normal respiratory effort, no increased work of breathing. GU: No CVA tenderness.  No bladder fullness or masses. Vulvovaginal atrophy w/ pallor, loss of rugae, introital retraction, excoriations.  Vulvar thinning, fusion of labia, clitoral hood retraction, prominent urethral meatus.   *** external genitalia, *** pubic hair distribution, no lesions.  Normal urethral meatus, no lesions, no prolapse, no discharge.   No urethral masses, tenderness and/or tenderness. No bladder fullness, tenderness or masses. *** vagina mucosa, *** estrogen effect, no discharge, no lesions, *** pelvic support, *** cystocele and *** rectocele noted.  No cervical motion tenderness.  Uterus is freely mobile and non-fixed.  No adnexal/parametria masses or tenderness noted.  Anus and perineum are without rashes or lesions.   ***  Neurologic: Grossly intact, no focal deficits, moving all 4 extremities. Psychiatric: Normal mood and affect.    Laboratory Data: Urinalysis CATH UA  *** I have reviewed the labs.   Pertinent  Imaging: N/A  Assessment & Plan:    1. Suspected UTI -UA grossly infected -urine culture pending -started on Macrobid 100 mg, twice daily for seven days   No follow-ups on file.  These notes generated with voice recognition software. I apologize for typographical errors.  [place], [place] [address]  [place], [place] [address] [phone]

## 2022-02-05 ENCOUNTER — Ambulatory Visit: Payer: PPO | Admitting: Urology

## 2022-02-11 NOTE — Progress Notes (Deleted)
02/12/2022 3:04 PM   [name] Jun 22, 1945 [id number]  Referring provider: Adin Hector, MD [facility] [place],  [place] [address]  Urological history: 1. Bladder stone -CT (10/2021) - 3 mm and 4 mm stones in the right posterior bladder lumen -cysto (12/2021) - cloudy urine -urine cytology (12/2021) - negative for high-grade urothelial carcinoma  No chief complaint on file.   HPI: BRESHAY ILG is a 76 y.o. female who presents today for burning with her husband, [name].   At her visit on 01/14/2022, she stated that she started to experience intense burning and frequency associated w/ lower abdominal pain.  UA yellow cloudy, SG 1.025, pH 6.0, +2 leukocytes, +protein, 1+RBC's, >30 WBC's and many bacteria.  Urine culture was positive for citrobacter freundii.  She was treated with seven days of culture appropriate antibiotics.    She presents today stating she is still experiencing ***  CATH UA ***  PMH: Past Medical History:  Diagnosis Date   Collagen vascular disease (HCC)    Depression    Hyperlipidemia    Muscle pain    Neuropathy    RA (rheumatoid arthritis) ([place])    Thyroid disease    Vitamin B 12 deficiency     Surgical History: Past Surgical History:  Procedure Laterality Date   ABDOMINAL HYSTERECTOMY     BREAST BIOPSY Left    2 benign biopsies   ECTOPIC PREGNANCY SURGERY     KNEE SURGERY Right    LAMINECTOMY     leg vein stripping Bilateral    TONSILLECTOMY      Home Medications:  Allergies as of 02/12/2022       Reactions   Codeine Nausea And Vomiting, Other (See Comments)   Demerol [meperidine] Nausea And Vomiting        Medication List        Accurate as of February 11, 2022  3:04 PM. If you have any questions, ask your nurse or doctor.          Adalimumab 40 MG/0.8ML Pskt Inject 40 mg into the skin every 14 (fourteen) days.   albuterol 108 (90 Base) MCG/ACT inhaler Commonly known as:  VENTOLIN HFA Inhale 1-2 puffs into the lungs every 6 (six) hours as needed for wheezing or shortness of breath.   aspirin EC 81 MG tablet Take 81 mg by mouth daily.   atorvastatin 40 MG tablet Commonly known as: LIPITOR Take 1 tablet (40 mg total) by mouth daily at 6 PM.   azithromycin 250 MG tablet Commonly known as: ZITHROMAX Take 250 mg by mouth every Monday, Wednesday, and Friday.   clonazePAM 0.5 MG tablet Commonly known as: KLONOPIN Take 0.5 mg by mouth 2 (two) times daily as needed for anxiety.   clopidogrel 75 MG tablet Commonly known as: PLAVIX Take 1 tablet (75 mg total) by mouth daily.   folic acid 1 MG tablet Commonly known as: FOLVITE Take 1 mg by mouth daily.   ipratropium-albuterol 0.5-2.5 (3) MG/3ML Soln Commonly known as: DUONEB Inhale 3 mLs into the lungs 2 (two) times daily as needed (wheezing or shortness of breath).   lamoTRIgine 100 MG tablet Commonly known as: LAMICTAL Take 100 mg by mouth daily.   levothyroxine 112 MCG tablet Commonly known as: SYNTHROID Take 1 tablet (112 mcg total) by mouth daily.   methotrexate 2.5 MG tablet Commonly known as: RHEUMATREX Take 20 mg by mouth every Saturday.   mirtazapine 15 MG tablet  Commonly known as: REMERON Take 15 mg by mouth at bedtime.   nitrofurantoin (macrocrystal-monohydrate) 100 MG capsule Commonly known as: MACROBID Take 1 capsule (100 mg total) by mouth every 12 (twelve) hours.   pantoprazole 40 MG tablet Commonly known as: PROTONIX Take 40 mg by mouth.   potassium chloride 8 MEQ tablet Commonly known as: KLOR-CON Take 8 mEq by mouth daily.   predniSONE 5 MG tablet Commonly known as: DELTASONE Take 2.5 mg by mouth daily.   torsemide 20 MG tablet Commonly known as: DEMADEX Take 2 tablets (40 mg total) by mouth daily.   venlafaxine XR 150 MG 24 hr capsule Commonly known as: EFFEXOR-XR Take 150 mg by mouth daily.   Vitamin D (Ergocalciferol) 1.25 MG (50000 UNIT) Caps  capsule Commonly known as: DRISDOL Take 50,000 Int'l Units by mouth every 7 (seven) days.        Allergies:  Allergies  Allergen Reactions   Codeine Nausea And Vomiting and Other (See Comments)   Demerol [Meperidine] Nausea And Vomiting    Family History: Family History  Problem Relation Age of Onset   COPD Father    Cancer Father    Breast cancer Cousin    Breast cancer Maternal Aunt    Breast cancer Maternal Aunt     Social History:  reports that she has never smoked. She has never been exposed to tobacco smoke. She has never used smokeless tobacco. No history on file for alcohol use and drug use.  ROS: Pertinent ROS in HPI  Physical Exam: There were no vitals taken for this visit.  Constitutional:  Well nourished. Alert and oriented, No acute distress. HEENT: [place] AT, moist mucus membranes.  Trachea midline, no masses. Cardiovascular: No clubbing, cyanosis, or edema. Respiratory: Normal respiratory effort, no increased work of breathing. GU: No CVA tenderness.  No bladder fullness or masses. Vulvovaginal atrophy w/ pallor, loss of rugae, introital retraction, excoriations.  Vulvar thinning, fusion of labia, clitoral hood retraction, prominent urethral meatus.   *** external genitalia, *** pubic hair distribution, no lesions.  Normal urethral meatus, no lesions, no prolapse, no discharge.   No urethral masses, tenderness and/or tenderness. No bladder fullness, tenderness or masses. *** vagina mucosa, *** estrogen effect, no discharge, no lesions, *** pelvic support, *** cystocele and *** rectocele noted.  No cervical motion tenderness.  Uterus is freely mobile and non-fixed.  No adnexal/parametria masses or tenderness noted.  Anus and perineum are without rashes or lesions.   ***  Neurologic: Grossly intact, no focal deficits, moving all 4 extremities. Psychiatric: Normal mood and affect.    Laboratory Data: Urinalysis CATH UA  *** I have reviewed the labs.   Pertinent  Imaging: N/A  Assessment & Plan:    1. Suspected UTI -UA grossly infected -urine culture pending -started on Macrobid 100 mg, twice daily for seven days   No follow-ups on file.  These notes generated with voice recognition software. I apologize for typographical errors.  [place], [place] [address]  [place], [place] [address] [phone]

## 2022-02-12 ENCOUNTER — Ambulatory Visit: Payer: PPO | Admitting: Urology

## 2022-02-12 DIAGNOSIS — R3989 Other symptoms and signs involving the genitourinary system: Secondary | ICD-10-CM

## 2022-02-18 NOTE — Progress Notes (Deleted)
02/19/2022 1:00 PM   [name] 01-18-1946 [id number]  Referring provider: Adin Hector, MD [place] [facility] [place],  [place] [address]  Urological history: 1. Bladder stone -CT (10/2021) - 3 mm and 4 mm stones in the right posterior bladder lumen -cysto (12/2021) - cloudy urine -urine cytology (12/2021) - negative for high-grade urothelial carcinoma  No chief complaint on file.   HPI: [name] is a 76 y.o. female who presents today for burning with her husband, [name].   At her visit on 01/14/2022, she stated that she started to experience intense burning and frequency associated w/ lower abdominal pain.  UA yellow cloudy, SG 1.025, pH 6.0, +2 leukocytes, +protein, 1+RBC's, >30 WBC's and many bacteria.  Urine culture was positive for citrobacter freundii.  She was treated with seven days of culture appropriate antibiotics.    She presents today stating she is still experiencing ***  CATH UA ***  PMH: Past Medical History:  Diagnosis Date   Collagen vascular disease (HCC)    Depression    Hyperlipidemia    Muscle pain    Neuropathy    RA (rheumatoid arthritis) ([place])    Thyroid disease    Vitamin B 12 deficiency     Surgical History: Past Surgical History:  Procedure Laterality Date   ABDOMINAL HYSTERECTOMY     BREAST BIOPSY Left    2 benign biopsies   ECTOPIC PREGNANCY SURGERY     KNEE SURGERY Right    LAMINECTOMY     leg vein stripping Bilateral    TONSILLECTOMY      Home Medications:  Allergies as of 02/19/2022       Reactions   Codeine Nausea And Vomiting, Other (See Comments)   Demerol [meperidine] Nausea And Vomiting        Medication List        Accurate as of February 18, 2022  1:00 PM. If you have any questions, ask your nurse or doctor.          Adalimumab 40 MG/0.8ML Pskt Inject 40 mg into the skin every 14 (fourteen) days.   albuterol 108 (90 Base) MCG/ACT inhaler Commonly known as:  VENTOLIN HFA Inhale 1-2 puffs into the lungs every 6 (six) hours as needed for wheezing or shortness of breath.   aspirin EC 81 MG tablet Take 81 mg by mouth daily.   atorvastatin 40 MG tablet Commonly known as: LIPITOR Take 1 tablet (40 mg total) by mouth daily at 6 PM.   azithromycin 250 MG tablet Commonly known as: ZITHROMAX Take 250 mg by mouth every Monday, Wednesday, and Friday.   clonazePAM 0.5 MG tablet Commonly known as: KLONOPIN Take 0.5 mg by mouth 2 (two) times daily as needed for anxiety.   clopidogrel 75 MG tablet Commonly known as: PLAVIX Take 1 tablet (75 mg total) by mouth daily.   folic acid 1 MG tablet Commonly known as: FOLVITE Take 1 mg by mouth daily.   ipratropium-albuterol 0.5-2.5 (3) MG/3ML Soln Commonly known as: DUONEB Inhale 3 mLs into the lungs 2 (two) times daily as needed (wheezing or shortness of breath).   lamoTRIgine 100 MG tablet Commonly known as: LAMICTAL Take 100 mg by mouth daily.   levothyroxine 112 MCG tablet Commonly known as: SYNTHROID Take 1 tablet (112 mcg total) by mouth daily.   methotrexate 2.5 MG tablet Commonly known as: RHEUMATREX Take 20 mg by mouth every Saturday.   mirtazapine 15 MG tablet  Commonly known as: REMERON Take 15 mg by mouth at bedtime.   nitrofurantoin (macrocrystal-monohydrate) 100 MG capsule Commonly known as: MACROBID Take 1 capsule (100 mg total) by mouth every 12 (twelve) hours.   pantoprazole 40 MG tablet Commonly known as: PROTONIX Take 40 mg by mouth.   potassium chloride 8 MEQ tablet Commonly known as: KLOR-CON Take 8 mEq by mouth daily.   predniSONE 5 MG tablet Commonly known as: DELTASONE Take 2.5 mg by mouth daily.   torsemide 20 MG tablet Commonly known as: DEMADEX Take 2 tablets (40 mg total) by mouth daily.   venlafaxine XR 150 MG 24 hr capsule Commonly known as: EFFEXOR-XR Take 150 mg by mouth daily.   Vitamin D (Ergocalciferol) 1.25 MG (50000 UNIT) Caps  capsule Commonly known as: DRISDOL Take 50,000 Int'l Units by mouth every 7 (seven) days.        Allergies:  Allergies  Allergen Reactions   Codeine Nausea And Vomiting and Other (See Comments)   Demerol [Meperidine] Nausea And Vomiting    Family History: Family History  Problem Relation Age of Onset   COPD Father    Cancer Father    Breast cancer Cousin    Breast cancer Maternal Aunt    Breast cancer Maternal Aunt     Social History:  reports that she has never smoked. She has never been exposed to tobacco smoke. She has never used smokeless tobacco. No history on file for alcohol use and drug use.  ROS: Pertinent ROS in HPI  Physical Exam: There were no vitals taken for this visit.  Constitutional:  Well nourished. Alert and oriented, No acute distress. HEENT: [place] AT, moist mucus membranes.  Trachea midline, no masses. Cardiovascular: No clubbing, cyanosis, or edema. Respiratory: Normal respiratory effort, no increased work of breathing. GU: No CVA tenderness.  No bladder fullness or masses. Vulvovaginal atrophy w/ pallor, loss of rugae, introital retraction, excoriations.  Vulvar thinning, fusion of labia, clitoral hood retraction, prominent urethral meatus.   *** external genitalia, *** pubic hair distribution, no lesions.  Normal urethral meatus, no lesions, no prolapse, no discharge.   No urethral masses, tenderness and/or tenderness. No bladder fullness, tenderness or masses. *** vagina mucosa, *** estrogen effect, no discharge, no lesions, *** pelvic support, *** cystocele and *** rectocele noted.  No cervical motion tenderness.  Uterus is freely mobile and non-fixed.  No adnexal/parametria masses or tenderness noted.  Anus and perineum are without rashes or lesions.   ***  Neurologic: Grossly intact, no focal deficits, moving all 4 extremities. Psychiatric: Normal mood and affect.    Laboratory Data: Urinalysis CATH UA  *** I have reviewed the labs.   Pertinent  Imaging: N/A  Assessment & Plan:    1. Suspected UTI -UA grossly infected -urine culture pending -started on Macrobid 100 mg, twice daily for seven days   No follow-ups on file.  These notes generated with voice recognition software. I apologize for typographical errors.  [place], [place] [address]  [place],  [address] [phone]

## 2022-02-19 ENCOUNTER — Ambulatory Visit: Payer: PPO | Admitting: Urology

## 2022-03-13 DIAGNOSIS — F331 Major depressive disorder, recurrent, moderate: Secondary | ICD-10-CM | POA: Diagnosis not present

## 2022-03-13 DIAGNOSIS — F5105 Insomnia due to other mental disorder: Secondary | ICD-10-CM | POA: Diagnosis not present

## 2022-03-13 DIAGNOSIS — F411 Generalized anxiety disorder: Secondary | ICD-10-CM | POA: Diagnosis not present

## 2022-03-24 DIAGNOSIS — S199XXA Unspecified injury of neck, initial encounter: Secondary | ICD-10-CM | POA: Diagnosis not present

## 2022-03-24 DIAGNOSIS — M542 Cervicalgia: Secondary | ICD-10-CM | POA: Diagnosis not present

## 2022-03-24 DIAGNOSIS — G319 Degenerative disease of nervous system, unspecified: Secondary | ICD-10-CM | POA: Diagnosis not present

## 2022-03-24 DIAGNOSIS — I6782 Cerebral ischemia: Secondary | ICD-10-CM | POA: Diagnosis not present

## 2022-03-24 DIAGNOSIS — Z5321 Procedure and treatment not carried out due to patient leaving prior to being seen by health care provider: Secondary | ICD-10-CM | POA: Diagnosis not present

## 2022-03-24 DIAGNOSIS — M47812 Spondylosis without myelopathy or radiculopathy, cervical region: Secondary | ICD-10-CM | POA: Diagnosis not present

## 2022-03-24 DIAGNOSIS — R112 Nausea with vomiting, unspecified: Secondary | ICD-10-CM | POA: Diagnosis not present

## 2022-03-24 DIAGNOSIS — S1980XA Other specified injuries of unspecified part of neck, initial encounter: Secondary | ICD-10-CM | POA: Diagnosis not present

## 2022-03-24 DIAGNOSIS — S0990XA Unspecified injury of head, initial encounter: Secondary | ICD-10-CM | POA: Diagnosis not present

## 2022-03-24 DIAGNOSIS — S0003XA Contusion of scalp, initial encounter: Secondary | ICD-10-CM | POA: Diagnosis not present

## 2022-04-25 ENCOUNTER — Telehealth: Payer: Self-pay | Admitting: *Deleted

## 2022-04-25 NOTE — Telephone Encounter (Signed)
Pt calling asking for a prescription be called in for itching and burning. Pt states the itching and burning has been going on for 2 days and she has no transportation to come in. Pt states her husband is having hip surgery sometime and he can't drive her. Pt last seen 12/2021, pt has canceled last appts.

## 2022-04-26 NOTE — Telephone Encounter (Signed)
She has to be seen prior to prescribing medication. Per shannon   Notified patient as instructed, Patient states she can't come in . I advised her to call and scheduled when she can come in for a appt.

## 2022-05-03 NOTE — Progress Notes (Unsigned)
01/14/2022 9:31 PM   [name] 10-05-1945 [id number]  Referring provider: Adin Hector, MD [place] [facility] [place],  [place] [address]  Urological history: 1. Bladder stone -CT (10/2021) - 3 mm and 4 mm stones in the right posterior bladder lumen -cysto (12/2021) - cloudy urine -urine cytology (12/2021) - negative for high-grade urothelial carcinoma  Chief Complaint  Patient presents with   Dysuria    HPI: ALITA WALDREN is a 77 y.o. female who presents today for burning with her husband, [name].   CATH UA    PMH: Past Medical History:  Diagnosis Date   Collagen vascular disease ([place])    Depression    Hyperlipidemia    Muscle pain    Neuropathy    RA (rheumatoid arthritis) ([place])    Thyroid disease    Vitamin B 12 deficiency     Surgical History: Past Surgical History:  Procedure Laterality Date   ABDOMINAL HYSTERECTOMY     BREAST BIOPSY Left    2 benign biopsies   ECTOPIC PREGNANCY SURGERY     KNEE SURGERY Right    LAMINECTOMY     leg vein stripping Bilateral    TONSILLECTOMY      Home Medications:  Allergies as of 01/14/2022       Reactions   Codeine Nausea And Vomiting, Other (See Comments)   Demerol [meperidine] Nausea And Vomiting        Medication List        Accurate as of January 14, 2022  9:31 PM. If you have any questions, ask your nurse or doctor.          Adalimumab 40 MG/0.8ML Pskt Inject 40 mg into the skin every 14 (fourteen) days.   albuterol 108 (90 Base) MCG/ACT inhaler Commonly known as: VENTOLIN HFA Inhale 1-2 puffs into the lungs every 6 (six) hours as needed for wheezing or shortness of breath.   aspirin EC 81 MG tablet Take 81 mg by mouth daily.   atorvastatin 40 MG tablet Commonly known as: LIPITOR Take 1 tablet (40 mg total) by mouth daily at 6 PM.   azithromycin 250 MG tablet Commonly known as: ZITHROMAX Take 250 mg by mouth every Monday, Wednesday, and Friday.    clonazePAM 0.5 MG tablet Commonly known as: KLONOPIN Take 0.5 mg by mouth 2 (two) times daily as needed for anxiety.   clopidogrel 75 MG tablet Commonly known as: PLAVIX Take 1 tablet (75 mg total) by mouth daily.   folic acid 1 MG tablet Commonly known as: FOLVITE Take 1 mg by mouth daily.   ipratropium-albuterol 0.5-2.5 (3) MG/3ML Soln Commonly known as: DUONEB Inhale 3 mLs into the lungs 2 (two) times daily as needed (wheezing or shortness of breath).   lamoTRIgine 100 MG tablet Commonly known as: LAMICTAL Take 100 mg by mouth daily.   levothyroxine 112 MCG tablet Commonly known as: SYNTHROID Take 1 tablet (112 mcg total) by mouth daily.   methotrexate 2.5 MG tablet Commonly known as: RHEUMATREX Take 20 mg by mouth every Saturday.   mirtazapine 15 MG tablet Commonly known as: REMERON Take 15 mg by mouth at bedtime.   nitrofurantoin (macrocrystal-monohydrate) 100 MG capsule Commonly known as: MACROBID Take 1 capsule (100 mg total) by mouth every 12 (twelve) hours. Started by: Zara Council, PA-C   pantoprazole 40 MG tablet Commonly known as: PROTONIX Take 40 mg by mouth.   potassium chloride 8 MEQ tablet Commonly known as:  KLOR-CON Take 8 mEq by mouth daily.   predniSONE 5 MG tablet Commonly known as: DELTASONE Take 2.5 mg by mouth daily.   torsemide 20 MG tablet Commonly known as: DEMADEX Take 2 tablets (40 mg total) by mouth daily.   venlafaxine XR 150 MG 24 hr capsule Commonly known as: EFFEXOR-XR Take 150 mg by mouth daily.   Vitamin D (Ergocalciferol) 1.25 MG (50000 UNIT) Caps capsule Commonly known as: DRISDOL Take 50,000 Int'l Units by mouth every 7 (seven) days.        Allergies:  Allergies  Allergen Reactions   Codeine Nausea And Vomiting and Other (See Comments)   Demerol [Meperidine] Nausea And Vomiting    Family History: Family History  Problem Relation Age of Onset   COPD Father    Cancer Father    Breast cancer Cousin     Breast cancer Maternal Aunt    Breast cancer Maternal Aunt     Social History:  reports that she has never smoked. She has never been exposed to tobacco smoke. She has never used smokeless tobacco. No history on file for alcohol use and drug use.  ROS: Pertinent ROS in HPI  Physical Exam: BP 138/83   Pulse 76   Ht '5\' 5"'$  (1.651 m)   BMI 28.29 kg/m   Constitutional:  Well nourished. Alert and oriented, No acute distress. HEENT: [place] AT, moist mucus membranes.  Trachea midline, no masses. Cardiovascular: No clubbing, cyanosis, or edema. Respiratory: Normal respiratory effort, no increased work of breathing. GU: No CVA tenderness.  No bladder fullness or masses. Vulvovaginal atrophy w/ pallor, loss of rugae, introital retraction, excoriations.  Vulvar thinning, fusion of labia, clitoral hood retraction, prominent urethral meatus.   *** external genitalia, *** pubic hair distribution, no lesions.  Normal urethral meatus, no lesions, no prolapse, no discharge.   No urethral masses, tenderness and/or tenderness. No bladder fullness, tenderness or masses. *** vagina mucosa, *** estrogen effect, no discharge, no lesions, *** pelvic support, *** cystocele and *** rectocele noted.  No cervical motion tenderness.  Uterus is freely mobile and non-fixed.  No adnexal/parametria masses or tenderness noted.  Anus and perineum are without rashes or lesions.   ***  Neurologic: Grossly intact, no focal deficits, moving all 4 extremities. Psychiatric: Normal mood and affect.    Laboratory Data: Urinalysis See HPI and EPIC I have reviewed the labs.   Pertinent Imaging: N/A  Assessment & Plan:    1. Suspected UTI -CATH UA *** -urine culture pending  Return in about 1 month (around 02/14/2022) for PVR and OAB questionnaire.  These notes generated with voice recognition software. I apologize for typographical errors.  [place], [place] [address]  [place], [place] [address] [phone]

## 2022-05-06 ENCOUNTER — Telehealth: Payer: Self-pay

## 2022-05-06 ENCOUNTER — Other Ambulatory Visit
Admission: RE | Admit: 2022-05-06 | Discharge: 2022-05-06 | Disposition: A | Discharge status: Home / Self Care | Payer: PPO | Attending: Urology | Admitting: Urology

## 2022-05-06 ENCOUNTER — Ambulatory Visit (INDEPENDENT_AMBULATORY_CARE_PROVIDER_SITE_OTHER): Payer: PPO | Admitting: Urology

## 2022-05-06 ENCOUNTER — Encounter: Payer: Self-pay | Admitting: Urology

## 2022-05-06 VITALS — BP 172/91 | HR 66

## 2022-05-06 DIAGNOSIS — R3989 Other symptoms and signs involving the genitourinary system: Secondary | ICD-10-CM | POA: Diagnosis not present

## 2022-05-06 LAB — URINALYSIS, COMPLETE (UACMP) WITH MICROSCOPIC
Bilirubin Urine: NEGATIVE
Glucose, UA: NEGATIVE mg/dL
Ketones, ur: NEGATIVE mg/dL
Leukocytes,Ua: NEGATIVE
Nitrite: NEGATIVE
Protein, ur: 30 mg/dL — AB
Specific Gravity, Urine: 1.02 (ref 1.005–1.030)
pH: 5.5 (ref 5.0–8.0)

## 2022-05-06 MED ORDER — NITROFURANTOIN MONOHYD MACRO 100 MG PO CAPS: 100.0000 mg | ORAL_CAPSULE | Freq: Two times a day (BID) | ORAL | 0 refills | Status: DC

## 2022-05-06 NOTE — Telephone Encounter (Signed)
-----   Message from Nori Riis, PA-C sent at 05/06/2022 11:30 AM EST ----- Would you please let [name] know that her urine does look infected and we will send in Macrobid 100 milligrams twice daily for 7 days to [place]?

## 2022-05-06 NOTE — Telephone Encounter (Signed)
Called pt informed her of the information below. Pt voiced understanding.

## 2022-05-08 ENCOUNTER — Telehealth: Payer: Self-pay | Admitting: Family Medicine

## 2022-05-08 LAB — URINE CULTURE: Culture: 50000 — AB

## 2022-05-08 MED ORDER — SULFAMETHOXAZOLE-TRIMETHOPRIM 800-160 MG PO TABS: 1.0000 | ORAL_TABLET | Freq: Two times a day (BID) | ORAL | 0 refills | Status: DC

## 2022-05-08 NOTE — Telephone Encounter (Signed)
Patient notified and ABX sent to pharmacy.  

## 2022-05-08 NOTE — Telephone Encounter (Signed)
-----   Message from Nori Riis, PA-C sent at 05/08/2022  9:16 AM EST ----- Please let Mrs. Mcgeachy know that her urine culture is positive for infection and we need to change her antibiotic to Septra DS twice daily for seven days.

## 2022-07-01 DIAGNOSIS — E7849 Other hyperlipidemia: Secondary | ICD-10-CM | POA: Diagnosis not present

## 2022-07-01 DIAGNOSIS — I5032 Chronic diastolic (congestive) heart failure: Secondary | ICD-10-CM | POA: Diagnosis not present

## 2022-07-01 DIAGNOSIS — D849 Immunodeficiency, unspecified: Secondary | ICD-10-CM | POA: Diagnosis not present

## 2022-07-01 DIAGNOSIS — R7303 Prediabetes: Secondary | ICD-10-CM | POA: Diagnosis not present

## 2022-07-01 DIAGNOSIS — G4733 Obstructive sleep apnea (adult) (pediatric): Secondary | ICD-10-CM | POA: Diagnosis not present

## 2022-07-01 DIAGNOSIS — J8410 Pulmonary fibrosis, unspecified: Secondary | ICD-10-CM | POA: Diagnosis not present

## 2022-07-01 DIAGNOSIS — J9611 Chronic respiratory failure with hypoxia: Secondary | ICD-10-CM | POA: Diagnosis not present

## 2022-07-01 DIAGNOSIS — Z0000 Encounter for general adult medical examination without abnormal findings: Secondary | ICD-10-CM | POA: Diagnosis not present

## 2022-07-01 DIAGNOSIS — F3341 Major depressive disorder, recurrent, in partial remission: Secondary | ICD-10-CM | POA: Diagnosis not present

## 2022-07-01 DIAGNOSIS — M0579 Rheumatoid arthritis with rheumatoid factor of multiple sites without organ or systems involvement: Secondary | ICD-10-CM | POA: Diagnosis not present

## 2022-07-01 DIAGNOSIS — E034 Atrophy of thyroid (acquired): Secondary | ICD-10-CM | POA: Diagnosis not present

## 2022-07-01 DIAGNOSIS — D329 Benign neoplasm of meninges, unspecified: Secondary | ICD-10-CM | POA: Diagnosis not present

## 2022-07-01 DIAGNOSIS — I779 Disorder of arteries and arterioles, unspecified: Secondary | ICD-10-CM | POA: Diagnosis not present

## 2022-07-02 DIAGNOSIS — R7303 Prediabetes: Secondary | ICD-10-CM | POA: Diagnosis not present

## 2022-07-02 DIAGNOSIS — E7849 Other hyperlipidemia: Secondary | ICD-10-CM | POA: Diagnosis not present

## 2022-07-02 DIAGNOSIS — Z0000 Encounter for general adult medical examination without abnormal findings: Secondary | ICD-10-CM | POA: Diagnosis not present

## 2022-07-02 DIAGNOSIS — I779 Disorder of arteries and arterioles, unspecified: Secondary | ICD-10-CM | POA: Diagnosis not present

## 2022-07-02 DIAGNOSIS — F3341 Major depressive disorder, recurrent, in partial remission: Secondary | ICD-10-CM | POA: Diagnosis not present

## 2022-07-02 DIAGNOSIS — D849 Immunodeficiency, unspecified: Secondary | ICD-10-CM | POA: Diagnosis not present

## 2022-07-02 DIAGNOSIS — J8410 Pulmonary fibrosis, unspecified: Secondary | ICD-10-CM | POA: Diagnosis not present

## 2022-07-02 DIAGNOSIS — J9611 Chronic respiratory failure with hypoxia: Secondary | ICD-10-CM | POA: Diagnosis not present

## 2022-07-02 DIAGNOSIS — G4733 Obstructive sleep apnea (adult) (pediatric): Secondary | ICD-10-CM | POA: Diagnosis not present

## 2022-07-02 DIAGNOSIS — E034 Atrophy of thyroid (acquired): Secondary | ICD-10-CM | POA: Diagnosis not present

## 2022-07-02 DIAGNOSIS — I5032 Chronic diastolic (congestive) heart failure: Secondary | ICD-10-CM | POA: Diagnosis not present

## 2022-07-02 DIAGNOSIS — D329 Benign neoplasm of meninges, unspecified: Secondary | ICD-10-CM | POA: Diagnosis not present

## 2022-07-04 DIAGNOSIS — Z79899 Other long term (current) drug therapy: Secondary | ICD-10-CM | POA: Diagnosis not present

## 2022-07-04 DIAGNOSIS — M0579 Rheumatoid arthritis with rheumatoid factor of multiple sites without organ or systems involvement: Secondary | ICD-10-CM | POA: Diagnosis not present

## 2022-07-10 DIAGNOSIS — F411 Generalized anxiety disorder: Secondary | ICD-10-CM | POA: Diagnosis not present

## 2022-07-10 DIAGNOSIS — F331 Major depressive disorder, recurrent, moderate: Secondary | ICD-10-CM | POA: Diagnosis not present

## 2022-07-10 DIAGNOSIS — F5105 Insomnia due to other mental disorder: Secondary | ICD-10-CM | POA: Diagnosis not present

## 2022-07-11 DIAGNOSIS — R319 Hematuria, unspecified: Secondary | ICD-10-CM | POA: Diagnosis not present

## 2022-07-15 DIAGNOSIS — R490 Dysphonia: Secondary | ICD-10-CM | POA: Diagnosis not present

## 2022-07-16 ENCOUNTER — Other Ambulatory Visit: Payer: Self-pay | Admitting: Pulmonary Disease

## 2022-07-16 DIAGNOSIS — R490 Dysphonia: Secondary | ICD-10-CM

## 2022-07-22 ENCOUNTER — Ambulatory Visit
Admission: RE | Admit: 2022-07-22 | Discharge: 2022-07-22 | Disposition: A | Discharge status: Home / Self Care | Payer: PPO | Source: Ambulatory Visit | Attending: Pulmonary Disease | Admitting: Pulmonary Disease

## 2022-07-22 DIAGNOSIS — R490 Dysphonia: Secondary | ICD-10-CM | POA: Diagnosis not present

## 2022-07-22 DIAGNOSIS — Z043 Encounter for examination and observation following other accident: Secondary | ICD-10-CM | POA: Diagnosis not present

## 2022-07-22 MED ORDER — GADOBUTROL 1 MMOL/ML IV SOLN
7.0000 mL | Freq: Once | INTRAVENOUS | Status: AC | PRN
  Administered 2022-07-22: 7 mL via INTRAVENOUS

## 2022-07-23 ENCOUNTER — Ambulatory Visit: Payer: PPO | Attending: Pulmonary Disease | Admitting: Speech Pathology

## 2022-07-23 ENCOUNTER — Encounter: Payer: Self-pay | Admitting: Speech Pathology

## 2022-07-23 DIAGNOSIS — R41841 Cognitive communication deficit: Secondary | ICD-10-CM | POA: Insufficient documentation

## 2022-07-23 DIAGNOSIS — R471 Dysarthria and anarthria: Secondary | ICD-10-CM | POA: Insufficient documentation

## 2022-07-23 NOTE — Patient Instructions (Signed)
We will do speech therapy 2 times a week to see if we can improve your loudness and clarity so that people can understand you better. I recommend having your husband or a caregiver join you so that they can learn how to help you practice and how to help remind you to use your louder voice at home.   I also strongly recommend seeing a neurologist, like Dr. Karna Christmas recommended. Dr. Cristopher Peru at [facility] works with many of my patients.   I will also reach out to Dr. Karna Christmas to ask if we can get orders to do a swallowing test (Modified Barium Swallow) since you mentioned you are getting choked when drinking liquids. This will help [place] know if foods or liquids are going the right way, or if you have food left over in your throat after you swallow.

## 2022-07-23 NOTE — Therapy (Unsigned)
OUTPATIENT SPEECH LANGUAGE PATHOLOGY  MOTOR SPEECH EVALUATION   Patient Name: [name] MRN: [id number] DOB:Aug 19, 1945, 77 y.o., female Today's Date: 07/23/2022  PCP: Mckenzie Ferrier, MD  REFERRING PROVIDER: Vida Rigger, MD    End of Session - 07/23/22 1358     Visit Number 1    Number of Visits 25    Date for SLP Re-Evaluation 10/21/22    SLP Start Time 1350    SLP Stop Time  1450    SLP Time Calculation (min) 60 min    Activity Tolerance Patient tolerated treatment well             No past medical history on file.  Patient Active Problem List   Diagnosis Date Noted   Meningioma 09/15/2020   Chronic diastolic CHF (congestive heart failure) 09/15/2020   Memory difficulty 08/31/2020   History of stroke 08/31/2020   Falls frequently 08/31/2020   Ataxic dysarthria 08/31/2020   Acute CHF (congestive heart failure) 07/15/2020   Hypertension    Anterior epistaxis    Senile purpura 06/15/2020   Ganglion of joint 06/12/2020   Hyperlipidemia 06/12/2020   Hypothyroidism due to acquired atrophy of thyroid 06/12/2020   Recurrent major depressive disorder, in partial remission 06/12/2020   Rheumatoid nodulosis 06/12/2020   Sleep apnea 06/12/2020   Elevated troponin 02/06/2020   Chronic respiratory failure with hypoxia 12/22/2018   Immunosuppression 12/22/2018   Pulmonary fibrosis 12/21/2018   Bilateral carotid artery disease 07/31/2016   Acute CVA (cerebrovascular accident) 07/17/2016   Other abnormal glucose 11/25/2013   Prediabetes 11/25/2013   Rheumatoid arthritis with rheumatoid factor 09/20/2013    ONSET DATE: referral date 07/15/22; patient reports gradual onset over past several years   REFERRING DIAG: dysphonia  THERAPY DIAG:  Dysarthria and anarthria  Cognitive communication deficit  Rationale for Evaluation and Treatment Rehabilitation  SUBJECTIVE:   SUBJECTIVE STATEMENT: Patient reports needing to get better for two of her  grandchildren's weddings this year.   Pt accompanied by: self  PERTINENT HISTORY: [name] is a 77 y.o. female with PMH including rheumatoid arthritis, hyperlipidemia, meningioma, OSA, CAD, memory changes, CVA, Acute on chronic hypoxemic respiratory failure, emphysema and pulmonary fibrosis, lingual swelling, CHF, and depression. 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 Patients husband reported speech problem is worse in PM with significant problems communicating. Patient reported changes in her voice quality and word-finding difficulty. Patient has reported speech and short-term memory difficulty, sometimes difficulty articulating and sometimes difficulty word finding. At this time, patients husband reported she is speaking more softly during the last month. He feels like it sounds as if her teeth are closed when she is trying to talk.  Patient had an MRI in June of 2022 with the following impressions: No acute or subacute infarct. Moderate chronic small vessel ischemia without detected progression and 6 mm left parafalcine calcified meningioma with mild growth from 2018 but no brain contact.   DIAGNOSTIC FINDINGS: MRI 07/22/22 has not been read at time of this evaluation  PAIN:  Are you having pain? Yes: NPRS scale: 5/10 Pain location: both hands   FALLS: Has patient fallen in last 6 months?  Yes, Number of falls: 1  LIVING ENVIRONMENT: Lives with: lives with their spouse Lives in: House/apartment   PLOF:  Level of assistance: Needed assistance with ADLs Employment: Retired   PATIENT GOALS      "I've got to get better."  OBJECTIVE:  COGNITION: Overall  cognitive status: Impaired and not formally assessed today Functional deficits: patient reports wordfinding and short-term memory deficits. Spouse tells her she asks repetitive questions  MOTOR SPEECH: Overall motor speech: impaired Level of impairment: Word, Phrase,  Sentence, and Conversation Respiration: thoracic breathing and clavicular breathing, reduced breath support Phonation: hoarse and low vocal intensity Resonance: hypernasality Articulation: Impaired: word, phrase, sentence, and conversation Intelligibility: Intelligibility reduced Motor planning: Appears intact Motor speech errors:  unaware; pt reports she feels she is talking normally but others can't hear her Alternating Motion Rate: rapid, irregular, blurred; unable to calculate due to rapid rate. Sequential Motion Rate: rapid and Irregular Connected Speech characteristics: irregular articulatory breakdowns, short rushes of speech, Monopitch, reduced breath support, low intensity and hoarse vocal quality Intelligibility: For trained listener with context in a quiet environment, intelligibility rated as approximately 70 % Non-verbal Mckenzie apraxia: Not present Interfering components:  n/a Effective technique: slow rate and increased vocal intensity  Mckenzie MOTOR EXAMINATION: Facial : Strength impaired: Impaired left, overall reduced movement, minimal facial expression Lingual: Comment: Lingual tremor Velum: WFL Mandible: WFL Cough: WFL Voice: Hoarse, Weak, Hypernasal      Objective Voice Measurements Initial Evaluation 07/24/2022     Maximum phonation time for sustained "ah" 7.9 sec    Average fundamental frequency during sustained "ah" 157 Hz  (3.2 SD below average of 244 Hz +/- 27 for gender)     Average dB for sustained "ah" 63 dB    Habitual pitch 126 Hz    Highest dynamic pitch in conversational speech 236 Hz    Lowest dynamic pitch in conversational speech 77 Hz    Average dB in conversation 60.7 dB    /s/ :/z/ ratio(suggestive of dysfunction > 1.0) 1.7           PATIENT REPORTED OUTCOME MEASURES (PROM):  The Communication Effectiveness Survey is a patient-reported outcome measure in which the patient rates their own effectiveness in different communication situations.  A higher score indicates greater effectiveness.   Pt's self-rating was 14/32. Patient reported having a conversation with a family member or friends at home, being part of a conversation in a noisy environment, and having a conversation with someone at a distance (across the room) all as being not at all effective (score of 1). Patient reported participating in conversation with strangers in a quiet place, conversing with a familiar person over the telephone, conversing with a stranger over the telephone, speaking to a friend when you are emotionally upset or angry, and having a conversation while traveling in a car a score of 2.    TODAY'S TREATMENT: Diagnostic intervention: clinician modeled increased vocal intensity with loud sustained phonation of /a/. Patient returned demonstration with usual moderate cues, increasing avg db for /a/ from 63 to 74 dB. Subsequently, clinician modeled using same effort with word level reading task; pt increased intensity to avg 66 dB (improved from 60 dB), with subjective improvement in vocal quality and intelligibility.      PATIENT EDUCATION: Education details: strongly encourage seeing a neurologist, recommend objective evaluation of swallowing (MBSS), proposed plan of care Person educated: Patient Education method: Explanation Education comprehension: needs further education   HOME EXERCISE PROGRAM: To be provided  GOALS: Goals reviewed with patient? Yes  SHORT TERM GOALS: Target date: 10 sessions  Patient will demo HEP for dysarthria accurately with min cues. Baseline: Goal status: INITIAL  2.  Patient will maintain adequate vocal quality and intensity (>70dB) in sentence level responses 90% accuracy. Baseline:  Goal  status: INITIAL  3.  Patient will use dysarthria compensations (slow, loud, overpronounce, pause) in 2-3 sentence responses 90% accuracy with moderate cues. Baseline:  Goal status: INITIAL     LONG TERM GOALS: Target date:  10/21/22  Patient will demo HEP for dysarthria independently. Baseline:  Goal status: INITIAL  2.   Patient will maintain intensity (avg >70dB) in 5 minutes conversation with rare min cues. Baseline:  Goal status: INITIAL  3.   Patient will use dysarthria compensations (slow, loud, overpronounce, pause) for intelligibility >90% in 5-8 minutes conversation. Baseline:  Goal status: INITIAL  4.  Patient will participate in MBSS for objective assessment of swallowing function Baseline:  Goal status: INITIAL   ASSESSMENT:  CLINICAL IMPRESSION: Patient is a 77 y.o. female who was seen today for a motor speech evaluation. 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 Vocal intensity (avg 60.7 dB) is significantly below normal conversational range of 72-75 dB when measured using a sound level meter 50 cm away, and this impacted intelligibility which was perceived to be 70% for trained listener in a quiet room. Patient reports people constantly asking her to repeat herself, though she doesn't feel she is talking softly. Lingual tremor is noted, as well and tremors in both left and right hands. She also reports difficulty swallowing, including coughing/choking with liquids at least once per day. She sometimes feels there is food left over in her throat. Cognitive communication was not formally assessed, but patient reports memory difficulties and was observed to have difficulty with wordfinding in conversation. I recommend skilled ST to improve intelligibility and communication skills, as well as further assessment (MBS) of dysphagia, for improved interactions with family and friends and safety when swallowing. Reinforced Dr. Terence Lux recommendation to follow up with neurology due to suspected progressive  neurologic etiology of communication, cognitive and swallowing impairments.   OBJECTIVE IMPAIRMENTS include memory, expressive language, dysarthria, and dysphagia. These impairments are limiting patient from effectively communicating at home and in community and safety when swallowing. Factors affecting potential to achieve goals and functional outcome are severity of impairments. Patient will benefit from skilled SLP services to address above impairments and improve overall function.  REHAB POTENTIAL: Good  PLAN: SLP FREQUENCY: 2x/week  SLP DURATION: 12 weeks  PLANNED INTERVENTIONS: Aspiration precaution training, Pharyngeal strengthening exercises, Diet toleration management , Language facilitation, Trials of upgraded texture/liquids, Cueing hierachy, Internal/external aids, Functional tasks, Multimodal communication approach, SLP instruction and feedback, Compensatory strategies, Patient/family education, and Re-evaluation   Rondel Baton, MS, CCC-SLP Speech-Language Pathologist [phone]   [facility] Outpatient Rehabilitation at [facility] [address] [place], [place], [address] Phone: [phone]   Fax:  [phone]

## 2022-07-30 ENCOUNTER — Ambulatory Visit: Payer: PPO | Admitting: Speech Pathology

## 2022-07-30 DIAGNOSIS — R471 Dysarthria and anarthria: Secondary | ICD-10-CM

## 2022-07-30 NOTE — Therapy (Signed)
OUTPATIENT SPEECH LANGUAGE PATHOLOGY TREATMENT  Patient Name: [name] MRN: [id number] DOB:07-29-1945, 77 y.o., female Today's Date: 07/30/2022  PCP: Mckenzie Ferrier, MD  REFERRING PROVIDER: Vida Rigger, MD    End of Session - 07/30/22 1603     Visit Number 2    Number of Visits 25    Date for SLP Re-Evaluation 10/21/22    SLP Start Time 1600    SLP Stop Time  1700    SLP Time Calculation (min) 60 min    Activity Tolerance Patient tolerated treatment well             No past medical history on file.  Patient Active Problem List   Diagnosis Date Noted   Meningioma (HCC) 09/15/2020   Chronic diastolic CHF (congestive heart failure) (HCC) 09/15/2020   Memory difficulty 08/31/2020   History of stroke 08/31/2020   Falls frequently 08/31/2020   Ataxic dysarthria 08/31/2020   Acute CHF (congestive heart failure) (HCC) 07/15/2020   Hypertension    Anterior epistaxis    Senile purpura (HCC) 06/15/2020   Ganglion of joint 06/12/2020   Hyperlipidemia 06/12/2020   Hypothyroidism due to acquired atrophy of thyroid 06/12/2020   Recurrent major depressive disorder, in partial remission (HCC) 06/12/2020   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/12/2020   Sleep apnea 06/12/2020   Elevated troponin 02/06/2020   Chronic respiratory failure with hypoxia (HCC) 12/22/2018   Immunosuppression (HCC) 12/22/2018   Pulmonary fibrosis (HCC) 12/21/2018   Bilateral carotid artery disease (HCC) 07/31/2016   Acute CVA (cerebrovascular accident) (HCC) 07/17/2016   Other abnormal glucose 11/25/2013   Prediabetes 11/25/2013   Rheumatoid arthritis with rheumatoid factor (HCC) 09/20/2013    ONSET DATE: referral date 07/15/22; patient reports gradual onset over past several years   REFERRING DIAG: dysphonia  THERAPY DIAG:  Dysarthria and anarthria  Rationale for Evaluation and Treatment Rehabilitation  SUBJECTIVE:   SUBJECTIVE STATEMENT: Pt needed to repeat self multiple times on way  back to tx room due to low vocal intensity.  Pt accompanied by: self  PERTINENT HISTORY: [name] is a 77 y.o. female with PMH including rheumatoid arthritis, hyperlipidemia, meningioma, OSA, CAD, memory changes, CVA, Acute on chronic hypoxemic respiratory failure, emphysema and pulmonary fibrosis, lingual swelling, CHF, and depression. 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 Patients husband reported speech problem is worse in PM with significant problems communicating. Patient reported changes in her voice quality and word-finding difficulty. Patient has reported speech and short-term memory difficulty, sometimes difficulty articulating and sometimes difficulty word finding. At this time, patients husband reported she is speaking more softly during the last month. He feels like it sounds as if her teeth are closed when she is trying to talk.  Patient had an MRI in June of 2022 with the following impressions: No acute or subacute infarct. Moderate chronic small vessel ischemia without detected progression and 6 mm left parafalcine calcified meningioma with mild growth from 2018 but no brain contact.   DIAGNOSTIC FINDINGS: MRI 07/22/22 has not been read at time of this evaluation  PAIN:  Are you having pain? No   PATIENT GOALS      "I've got to get better."  OBJECTIVE:   TODAY'S TREATMENT: Initiated training in HEP and used biofeedback for education/building awareness of low vocal intensity. Spontaneous speech averaged 61 dB before intervention tasks began. Pt completed loud /a/ x 10 average 74 dB with usual mod cues, duration avg 5.7 seconds.  Progressed to word level reading tasks; pt required usual mod cues to maintain average of 71 dB. Generated personally relevant phrases for home practice. Pt used her loud voice to greet her caregiver in the waiting room after session.      PATIENT EDUCATION: Education details: strongly  encourage seeing a neurologist, recommend objective evaluation of swallowing (MBSS), proposed plan of care Person educated: Patient Education method: Explanation Education comprehension: needs further education   HOME EXERCISE PROGRAM: To be provided  GOALS: Goals reviewed with patient? Yes  SHORT TERM GOALS: Target date: 10 sessions  Patient will demo HEP for dysarthria accurately with min cues. Baseline: Goal status: INITIAL  2.  Patient will maintain adequate vocal quality and intensity (>70dB) in sentence level responses 90% accuracy. Baseline:  Goal status: INITIAL  3.  Patient will use dysarthria compensations (slow, loud, overpronounce, pause) in 2-3 sentence responses 90% accuracy with moderate cues. Baseline:  Goal status: INITIAL     LONG TERM GOALS: Target date: 10/21/22  Patient will demo HEP for dysarthria independently. Baseline:  Goal status: INITIAL  2.   Patient will maintain intensity (avg >70dB) in 5 minutes conversation with rare min cues. Baseline:  Goal status: INITIAL  3.   Patient will use dysarthria compensations (slow, loud, overpronounce, pause) for intelligibility >90% in 5-8 minutes conversation. Baseline:  Goal status: INITIAL  4.  Patient will participate in MBSS for objective assessment of swallowing function Baseline:  Goal status: INITIAL   ASSESSMENT:  CLINICAL IMPRESSION: Patient is a 77 y.o. 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 Vocal intensity (avg 61 dB today) is significantly below normal conversational range of 72-75 dB when measured using a sound level meter 50 cm away, significantly impacting intelligibility. Patient reports people constantly asking her to repeat herself, though she doesn't feel she is talking softly.  Lingual tremor is noted, as well and tremors in both left and right hands. She also reports difficulty swallowing, including coughing/choking with liquids at least once per day. She sometimes feels there is food left over in her throat. Cognitive communication was not formally assessed, but patient reports memory difficulties and was observed to have difficulty with wordfinding in conversation. I recommend skilled ST to improve intelligibility and communication skills, as well as further assessment (MBS) of dysphagia, for improved interactions with family and friends and safety when swallowing. Suspect progressive neurologic etiology of communication, cognitive and swallowing impairments; patient has appointment with neurology in June.   OBJECTIVE IMPAIRMENTS include memory, expressive language, dysarthria, and dysphagia. These impairments are limiting patient from effectively communicating at home and in community and safety when swallowing. Factors affecting potential to achieve goals and functional outcome are severity of impairments. Patient will benefit from skilled SLP services to address above impairments and improve overall function.  REHAB POTENTIAL: Good  PLAN: SLP FREQUENCY: 2x/week  SLP DURATION: 12 weeks  PLANNED INTERVENTIONS: Aspiration precaution training, Pharyngeal strengthening exercises, Diet toleration management , Language facilitation, Trials of upgraded texture/liquids, Cueing hierachy, Internal/external aids, Functional tasks, Multimodal communication approach, SLP instruction and feedback, Compensatory strategies, Patient/family education, and Re-evaluation   Rondel Baton, MS, CCC-SLP Speech-Language Pathologist [phone]   [facility] Outpatient Rehabilitation at [facility] [address] [place], [place], [address] Phone: [phone]   Fax:  [phone]

## 2022-07-30 NOTE — Patient Instructions (Signed)
Speech Therapy Homework  If you do not feel like you are shouting, it means you are not loud enough.   You should use effort of 8 out of 10, (with 10 being the most you can possibly do).  Say "Ahhhhhhhhhhhhhh" LOUD, and hold it for as long as you can with good quality. Your effort should be 8/10. Read OUT LOUD with the same effort: it should feel like you are shouting. See the phrases below and then read the handouts OUT LOUD (shout!)  Everyday phrases I need to go to the restroom  I need something to drink  Is it time to eat lunch?  I'd like to go outside  Is the pollen bad?  What's for dinner?  Doug, check my chair  That shoe hurts  You need to get me some Depends  [name], that was good!

## 2022-08-01 ENCOUNTER — Ambulatory Visit: Payer: PPO | Attending: Pulmonary Disease | Admitting: Speech Pathology

## 2022-08-01 DIAGNOSIS — R471 Dysarthria and anarthria: Secondary | ICD-10-CM | POA: Diagnosis not present

## 2022-08-01 DIAGNOSIS — R41841 Cognitive communication deficit: Secondary | ICD-10-CM | POA: Insufficient documentation

## 2022-08-01 NOTE — Therapy (Signed)
OUTPATIENT SPEECH LANGUAGE PATHOLOGY TREATMENT  Patient Name: [name] MRN: [id number] DOB:[birth year], 77 y.o., female Today's Date: 08/01/2022  PCP: Mckenzie Ferrier, MD  REFERRING PROVIDER: Vida Rigger, MD    End of Session - 08/01/22 1408     Visit Number 3    Number of Visits 25    Date for SLP Re-Evaluation 10/21/22    SLP Start Time 1400    SLP Stop Time  1500    SLP Time Calculation (min) 60 min    Activity Tolerance Patient tolerated treatment well             No past medical history on file.  Patient Active Problem List   Diagnosis Date Noted   Meningioma (HCC) 09/15/2020   Chronic diastolic CHF (congestive heart failure) (HCC) 09/15/2020   Memory difficulty 08/31/2020   History of stroke 08/31/2020   Falls frequently 08/31/2020   Ataxic dysarthria 08/31/2020   Acute CHF (congestive heart failure) (HCC) 07/15/2020   Hypertension    Anterior epistaxis    Senile purpura (HCC) 06/15/2020   Ganglion of joint 06/12/2020   Hyperlipidemia 06/12/2020   Hypothyroidism due to acquired atrophy of thyroid 06/12/2020   Recurrent major depressive disorder, in partial remission (HCC) 06/12/2020   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/12/2020   Sleep apnea 06/12/2020   Elevated troponin 02/06/2020   Chronic respiratory failure with hypoxia (HCC) 12/22/2018   Immunosuppression (HCC) 12/22/2018   Pulmonary fibrosis (HCC) 12/21/2018   Bilateral carotid artery disease (HCC) 07/31/2016   Acute CVA (cerebrovascular accident) (HCC) 07/17/2016   Other abnormal glucose 11/25/2013   Prediabetes 11/25/2013   Rheumatoid arthritis with rheumatoid factor (HCC) 09/20/2013    ONSET DATE: referral date 07/15/22; patient reports gradual onset over past several years   REFERRING DIAG: dysphonia  THERAPY DIAG:  Dysarthria and anarthria  Cognitive communication deficit  Rationale for Evaluation and Treatment Rehabilitation  SUBJECTIVE:   SUBJECTIVE STATEMENT: Pt needed to  repeat self multiple times on way back to tx room due to low vocal intensity.  Pt accompanied by: self  PERTINENT HISTORY: [name] is a 77 y.o. female with PMH including rheumatoid arthritis, hyperlipidemia, meningioma, OSA, CAD, memory changes, CVA, Acute on chronic hypoxemic respiratory failure, emphysema and pulmonary fibrosis, lingual swelling, CHF, and depression. 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 Patients husband reported speech problem is worse in PM with significant problems communicating. Patient reported changes in her voice quality and word-finding difficulty. Patient has reported speech and short-term memory difficulty, sometimes difficulty articulating and sometimes difficulty word finding. At this time, patients husband reported she is speaking more softly during the last month. He feels like it sounds as if her teeth are closed when she is trying to talk.  Patient had an MRI in June of 2022 with the following impressions: No acute or subacute infarct. Moderate chronic small vessel ischemia without detected progression and 6 mm left parafalcine calcified meningioma with mild growth from 2018 but no brain contact.   DIAGNOSTIC FINDINGS: MRI 07/22/22 has not been read at time of this evaluation  PAIN:  Are you having pain? No   PATIENT GOALS      "I've got to get better."  OBJECTIVE:   TODAY'S TREATMENT: Continued training in HEP and building awareness of low vocal intensity. Spontaneous speech averaged 62 dB before intervention tasks began. Pt completed loud /a/ x 10 average 78 dB with occasional min cues, duration avg 8.5  seconds. Progressed to word level tasks with cognitive load; pt required usual mod cues to maintain average of 68 dB. Personally relevant phrases averaged 71 dB with occasional min-mod cues.     PATIENT EDUCATION: Education details: must feel like shouting to be loud enough Person  educated: Patient Education method: Explanation Education comprehension: needs further education   HOME EXERCISE PROGRAM: To be provided  GOALS: Goals reviewed with patient? Yes  SHORT TERM GOALS: Target date: 10 sessions  Patient will demo HEP for dysarthria accurately with min cues. Baseline: Goal status: INITIAL  2.  Patient will maintain adequate vocal quality and intensity (>70dB) in sentence level responses 90% accuracy. Baseline:  Goal status: INITIAL  3.  Patient will use dysarthria compensations (slow, loud, overpronounce, pause) in 2-3 sentence responses 90% accuracy with moderate cues. Baseline:  Goal status: INITIAL     LONG TERM GOALS: Target date: 10/21/22  Patient will demo HEP for dysarthria independently. Baseline:  Goal status: INITIAL  2.   Patient will maintain intensity (avg >70dB) in 5 minutes conversation with rare min cues. Baseline:  Goal status: INITIAL  3.   Patient will use dysarthria compensations (slow, loud, overpronounce, pause) for intelligibility >90% in 5-8 minutes conversation. Baseline:  Goal status: INITIAL  4.  Patient will participate in MBSS for objective assessment of swallowing function Baseline:  Goal status: INITIAL   ASSESSMENT:  CLINICAL IMPRESSION: Patient is a 77 y.o. 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 Vocal intensity (avg 61 dB today) is significantly below normal conversational range of 72-75 dB when measured using a sound level meter 50 cm away, significantly impacting intelligibility. Patient reports people constantly asking her to repeat herself, though she doesn't feel she is talking softly. Lingual tremor is noted, as well and tremors in both left and right hands. She also reports difficulty swallowing,  including coughing/choking with liquids at least once per day. She sometimes feels there is food left over in her throat. Cognitive communication was not formally assessed, but patient reports memory difficulties and was observed to have difficulty with wordfinding in conversation. I recommend skilled ST to improve intelligibility and communication skills, as well as further assessment (MBS) of dysphagia, for improved interactions with family and friends and safety when swallowing. Suspect progressive neurologic etiology of communication, cognitive and swallowing impairments; patient has appointment with neurology in June.   OBJECTIVE IMPAIRMENTS include memory, expressive language, dysarthria, and dysphagia. These impairments are limiting patient from effectively communicating at home and in community and safety when swallowing. Factors affecting potential to achieve goals and functional outcome are severity of impairments. Patient will benefit from skilled SLP services to address above impairments and improve overall function.  REHAB POTENTIAL: Good  PLAN: SLP FREQUENCY: 2x/week  SLP DURATION: 12 weeks  PLANNED INTERVENTIONS: Aspiration precaution training, Pharyngeal strengthening exercises, Diet toleration management , Language facilitation, Trials of upgraded texture/liquids, Cueing hierachy, Internal/external aids, Functional tasks, Multimodal communication approach, SLP instruction and feedback, Compensatory strategies, Patient/family education, and Re-evaluation   Rondel Baton, MS, CCC-SLP Speech-Language Pathologist [phone]   [facility] at [facility] [address] [place], [place], [address] Phone: [phone]   Fax:  [phone]

## 2022-08-06 ENCOUNTER — Ambulatory Visit: Payer: PPO | Admitting: Speech Pathology

## 2022-08-06 DIAGNOSIS — R41841 Cognitive communication deficit: Secondary | ICD-10-CM

## 2022-08-06 DIAGNOSIS — R471 Dysarthria and anarthria: Secondary | ICD-10-CM

## 2022-08-06 NOTE — Therapy (Signed)
OUTPATIENT SPEECH LANGUAGE PATHOLOGY TREATMENT  Patient Name: [name] MRN: [id number] DOB:12/04/1945, 77 y.o., female Today's Date: 08/06/2022  PCP: Lynnea Ferrier, MD  REFERRING PROVIDER: Vida Rigger, MD    End of Session - 08/06/22 1409     Visit Number 4    Number of Visits 25    Date for SLP Re-Evaluation 10/21/22    SLP Start Time 1405    SLP Stop Time  1500    SLP Time Calculation (min) 55 min    Activity Tolerance Patient tolerated treatment well             No past medical history on file.  Patient Active Problem List   Diagnosis Date Noted   Meningioma (HCC) 09/15/2020   Chronic diastolic CHF (congestive heart failure) (HCC) 09/15/2020   Memory difficulty 08/31/2020   History of stroke 08/31/2020   Falls frequently 08/31/2020   Ataxic dysarthria 08/31/2020   Acute CHF (congestive heart failure) (HCC) 07/15/2020   Hypertension    Anterior epistaxis    Senile purpura (HCC) 06/15/2020   Ganglion of joint 06/12/2020   Hyperlipidemia 06/12/2020   Hypothyroidism due to acquired atrophy of thyroid 06/12/2020   Recurrent major depressive disorder, in partial remission (HCC) 06/12/2020   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/12/2020   Sleep apnea 06/12/2020   Elevated troponin 02/06/2020   Chronic respiratory failure with hypoxia (HCC) 12/22/2018   Immunosuppression (HCC) 12/22/2018   Pulmonary fibrosis (HCC) 12/21/2018   Bilateral carotid artery disease (HCC) 07/31/2016   Acute CVA (cerebrovascular accident) (HCC) 07/17/2016   Other abnormal glucose 11/25/2013   Prediabetes 11/25/2013   Rheumatoid arthritis with rheumatoid factor (HCC) 09/20/2013    ONSET DATE: referral date 07/15/22; patient reports gradual onset over past several years   REFERRING DIAG: dysphonia  THERAPY DIAG:  Dysarthria and anarthria  Cognitive communication deficit  Rationale for Evaluation and Treatment Rehabilitation  SUBJECTIVE:   SUBJECTIVE STATEMENT: Pt greeted  SLP in loud voice.  Pt accompanied by: self  PERTINENT HISTORY: [name] is a 77 y.o. female with PMH including rheumatoid arthritis, hyperlipidemia, meningioma, OSA, CAD, memory changes, CVA, Acute on chronic hypoxemic respiratory failure, emphysema and pulmonary fibrosis, lingual swelling, CHF, and depression. 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 Patients husband reported speech problem is worse in PM with significant problems communicating. Patient reported changes in her voice quality and word-finding difficulty. Patient has reported speech and short-term memory difficulty, sometimes difficulty articulating and sometimes difficulty word finding. At this time, patients husband reported she is speaking more softly during the last month. He feels like it sounds as if her teeth are closed when she is trying to talk.  Patient had an MRI in June of 2022 with the following impressions: No acute or subacute infarct. Moderate chronic small vessel ischemia without detected progression and 6 mm left parafalcine calcified meningioma with mild growth from 2018 but no brain contact.   DIAGNOSTIC FINDINGS: MRI 07/22/22 has not been read at time of this evaluation  PAIN:  Are you having pain? No   PATIENT GOALS      "I've got to get better."  OBJECTIVE:   TODAY'S TREATMENT: Continued training in HEP and building awareness of low vocal intensity. Spontaneous speech averaged 67db at beginning of session. Pt completed loud /a/ x 10 average 79 dB with occasional min cues, duration avg 9.8 seconds. Progressed to sentence generation with frequent mod cues to maintain average of  69 dB. Personally relevant phrases averaged 72 dB with occasional min-mod cues. Pt noted to cough when sipping water and occasionally on her saliva. MBS orders have been requested from referring provider.     PATIENT EDUCATION: Education details: must feel like  shouting to be loud enough Person educated: Patient Education method: Explanation Education comprehension: needs further education   HOME EXERCISE PROGRAM: To be provided  GOALS: Goals reviewed with patient? Yes  SHORT TERM GOALS: Target date: 10 sessions  Patient will demo HEP for dysarthria accurately with min cues. Baseline: Goal status: INITIAL  2.  Patient will maintain adequate vocal quality and intensity (>70dB) in sentence level responses 90% accuracy. Baseline:  Goal status: INITIAL  3.  Patient will use dysarthria compensations (slow, loud, overpronounce, pause) in 2-3 sentence responses 90% accuracy with moderate cues. Baseline:  Goal status: INITIAL     LONG TERM GOALS: Target date: 10/21/22  Patient will demo HEP for dysarthria independently. Baseline:  Goal status: INITIAL  2.   Patient will maintain intensity (avg >70dB) in 5 minutes conversation with rare min cues. Baseline:  Goal status: INITIAL  3.   Patient will use dysarthria compensations (slow, loud, overpronounce, pause) for intelligibility >90% in 5-8 minutes conversation. Baseline:  Goal status: INITIAL  4.  Patient will participate in MBSS for objective assessment of swallowing function Baseline:  Goal status: INITIAL   ASSESSMENT:  CLINICAL IMPRESSION: Patient is a 77 y.o. 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 Patient reports people constantly asking her to repeat herself, though she doesn't feel she is talking softly. Lingual tremor is noted, as well and tremors in both left and right hands. She also reports difficulty swallowing, including coughing/choking with liquids at least once per day. She sometimes feels there is food left over in her throat. Cognitive communication was not  formally assessed, but patient reports memory difficulties and was observed to have difficulty with wordfinding in conversation. I recommend skilled ST to improve intelligibility and communication skills, as well as further assessment (MBS) of dysphagia, for improved interactions with family and friends and safety when swallowing. Suspect progressive neurologic etiology of communication, cognitive and swallowing impairments; patient has appointment with neurology in June.   OBJECTIVE IMPAIRMENTS include memory, expressive language, dysarthria, and dysphagia. These impairments are limiting patient from effectively communicating at home and in community and safety when swallowing. Factors affecting potential to achieve goals and functional outcome are severity of impairments. Patient will benefit from skilled SLP services to address above impairments and improve overall function.  REHAB POTENTIAL: Good  PLAN: SLP FREQUENCY: 2x/week  SLP DURATION: 12 weeks  PLANNED INTERVENTIONS: Aspiration precaution training, Pharyngeal strengthening exercises, Diet toleration management , Language facilitation, Trials of upgraded texture/liquids, Cueing hierachy, Internal/external aids, Functional tasks, Multimodal communication approach, SLP instruction and feedback, Compensatory strategies, Patient/family education, and Re-evaluation   Rondel Baton, MS, CCC-SLP Speech-Language Pathologist [phone]   [facility] at [facility] [address] [place], [place], [address] Phone: [phone]   Fax:  [phone]

## 2022-08-07 ENCOUNTER — Other Ambulatory Visit: Payer: Self-pay | Admitting: Pulmonary Disease

## 2022-08-07 DIAGNOSIS — R1310 Dysphagia, unspecified: Secondary | ICD-10-CM

## 2022-08-14 ENCOUNTER — Ambulatory Visit: Payer: PPO | Admitting: Speech Pathology

## 2022-08-14 DIAGNOSIS — R471 Dysarthria and anarthria: Secondary | ICD-10-CM | POA: Diagnosis not present

## 2022-08-14 DIAGNOSIS — R41841 Cognitive communication deficit: Secondary | ICD-10-CM

## 2022-08-14 NOTE — Therapy (Signed)
OUTPATIENT SPEECH LANGUAGE PATHOLOGY TREATMENT  Patient Name: [name] MRN: [id number] DOB:July 24, 1945, 77 y.o., female Today's Date: 08/14/2022  PCP: Lynnea Ferrier, MD  REFERRING PROVIDER: Vida Rigger, MD    End of Session - 08/14/22 1610     Visit Number 5    Number of Visits 25    Date for SLP Re-Evaluation 10/21/22    SLP Start Time 1605    SLP Stop Time  1700    SLP Time Calculation (min) 55 min    Activity Tolerance Patient tolerated treatment well             No past medical history on file.  Patient Active Problem List   Diagnosis Date Noted   Meningioma (HCC) 09/15/2020   Chronic diastolic CHF (congestive heart failure) (HCC) 09/15/2020   Memory difficulty 08/31/2020   History of stroke 08/31/2020   Falls frequently 08/31/2020   Ataxic dysarthria 08/31/2020   Acute CHF (congestive heart failure) (HCC) 07/15/2020   Hypertension    Anterior epistaxis    Senile purpura (HCC) 06/15/2020   Ganglion of joint 06/12/2020   Hyperlipidemia 06/12/2020   Hypothyroidism due to acquired atrophy of thyroid 06/12/2020   Recurrent major depressive disorder, in partial remission (HCC) 06/12/2020   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/12/2020   Sleep apnea 06/12/2020   Elevated troponin 02/06/2020   Chronic respiratory failure with hypoxia (HCC) 12/22/2018   Immunosuppression (HCC) 12/22/2018   Pulmonary fibrosis (HCC) 12/21/2018   Bilateral carotid artery disease (HCC) 07/31/2016   Acute CVA (cerebrovascular accident) (HCC) 07/17/2016   Other abnormal glucose 11/25/2013   Prediabetes 11/25/2013   Rheumatoid arthritis with rheumatoid factor (HCC) 09/20/2013    ONSET DATE: referral date 07/15/22; patient reports gradual onset over past several years   REFERRING DIAG: dysphonia  THERAPY DIAG:  Dysarthria and anarthria  Cognitive communication deficit  Rationale for Evaluation and Treatment Rehabilitation  SUBJECTIVE:   SUBJECTIVE STATEMENT: "My husband  says he still can't hear me."  Pt accompanied by: self  PERTINENT HISTORY: [name] is a 77 y.o. female with PMH including rheumatoid arthritis, hyperlipidemia, meningioma, OSA, CAD, memory changes, CVA, Acute on chronic hypoxemic respiratory failure, emphysema and pulmonary fibrosis, lingual swelling, CHF, and depression. 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 Patients husband reported speech problem is worse in PM with significant problems communicating. Patient reported changes in her voice quality and word-finding difficulty. Patient has reported speech and short-term memory difficulty, sometimes difficulty articulating and sometimes difficulty word finding. At this time, patients husband reported she is speaking more softly during the last month. He feels like it sounds as if her teeth are closed when she is trying to talk.  Patient had an MRI in June of 2022 with the following impressions: No acute or subacute infarct. Moderate chronic small vessel ischemia without detected progression and 6 mm left parafalcine calcified meningioma with mild growth from 2018 but no brain contact.   DIAGNOSTIC FINDINGS: MRI 07/22/22 IMPRESSION: 1. No acute intracranial abnormality. Chronic small ischemia in the brain with mild progression since a 2022 MRI. 2. A subcentimeter left para-falcine dural calcification or less likely meningioma appears stable and inconsequential.  PAIN:  Are you having pain? No   PATIENT GOALS      "I've got to get better."  OBJECTIVE:   TODAY'S TREATMENT: Continued training in HEP and building awareness of low vocal intensity. Spontaneous speech ranged 63-69 db at beginning of session. Pt  completed loud /a/ x 10 average 77 dB with frequent mod cues, duration avg 10.9 seconds (pt did not have O2/[place] today, which likely contributed to reduced breath support. Personally relevant phrases averaged 72 dB with occasional  min-mod cues. Progressed to spontaneous sentence responses avg 68 dB, improves to 70 dB with usual mod cues. No overt s/sx aspiration when sipping water today; MBS scheduled for next Thursday.    PATIENT EDUCATION: Education details: must feel like shouting to be loud enough Person educated: Patient Education method: Explanation Education comprehension: needs further education   HOME EXERCISE PROGRAM: To be provided  GOALS: Goals reviewed with patient? Yes  SHORT TERM GOALS: Target date: 10 sessions  Patient will demo HEP for dysarthria accurately with min cues. Baseline: Goal status: INITIAL  2.  Patient will maintain adequate vocal quality and intensity (>70dB) in sentence level responses 90% accuracy. Baseline:  Goal status: INITIAL  3.  Patient will use dysarthria compensations (slow, loud, overpronounce, pause) in 2-3 sentence responses 90% accuracy with moderate cues. Baseline:  Goal status: INITIAL     LONG TERM GOALS: Target date: 10/21/22  Patient will demo HEP for dysarthria independently. Baseline:  Goal status: INITIAL  2.   Patient will maintain intensity (avg >70dB) in 5 minutes conversation with rare min cues. Baseline:  Goal status: INITIAL  3.   Patient will use dysarthria compensations (slow, loud, overpronounce, pause) for intelligibility >90% in 5-8 minutes conversation. Baseline:  Goal status: INITIAL  4.  Patient will participate in MBSS for objective assessment of swallowing function Baseline:  Goal status: INITIAL   ASSESSMENT:  CLINICAL IMPRESSION: Patient is a 77 y.o. 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 Patient reports people constantly asking her to repeat herself, though she doesn't feel she is talking softly. Lingual tremor is  noted, as well and tremors in both left and right hands. She also reports difficulty swallowing, including coughing/choking with liquids at least once per day. She sometimes feels there is food left over in her throat. Cognitive deficits impact pt's awareness and carryover of trained strategies. I recommend skilled ST to improve intelligibility and communication skills, as well as further assessment (MBS) of dysphagia, for improved interactions with family and friends and safety when swallowing. Suspect progressive neurologic etiology of communication, cognitive and swallowing impairments; patient has appointment with neurology in June.   OBJECTIVE IMPAIRMENTS include memory, expressive language, dysarthria, and dysphagia. These impairments are limiting patient from effectively communicating at home and in community and safety when swallowing. Factors affecting potential to achieve goals and functional outcome are severity of impairments. Patient will benefit from skilled SLP services to address above impairments and improve overall function.  REHAB POTENTIAL: Good  PLAN: SLP FREQUENCY: 2x/week  SLP DURATION: 12 weeks  PLANNED INTERVENTIONS: Aspiration precaution training, Pharyngeal strengthening exercises, Diet toleration management , Language facilitation, Trials of upgraded texture/liquids, Cueing hierachy, Internal/external aids, Functional tasks, Multimodal communication approach, SLP instruction and feedback, Compensatory strategies, Patient/family education, and Re-evaluation   Rondel Baton, MS, CCC-SLP Speech-Language Pathologist [phone]   [facility] Outpatient Rehabilitation at [facility] [address] [place], [place], [address] Phone: [phone]   Fax:  [phone]

## 2022-08-15 ENCOUNTER — Ambulatory Visit: Payer: PPO | Admitting: Speech Pathology

## 2022-08-15 DIAGNOSIS — R471 Dysarthria and anarthria: Secondary | ICD-10-CM | POA: Diagnosis not present

## 2022-08-15 DIAGNOSIS — R41841 Cognitive communication deficit: Secondary | ICD-10-CM

## 2022-08-15 NOTE — Therapy (Signed)
OUTPATIENT SPEECH LANGUAGE PATHOLOGY TREATMENT  Patient Name: [name] MRN: [id number] DOB:[birth year], 77 y.o., female Today's Date: 08/15/2022  PCP: Lynnea Ferrier, MD  REFERRING PROVIDER: Vida Rigger, MD    End of Session - 08/15/22 1607     Activity Tolerance Patient tolerated treatment well             No past medical history on file.  Patient Active Problem List   Diagnosis Date Noted   Meningioma (HCC) 09/15/2020   Chronic diastolic CHF (congestive heart failure) (HCC) 09/15/2020   Memory difficulty 08/31/2020   History of stroke 08/31/2020   Falls frequently 08/31/2020   Ataxic dysarthria 08/31/2020   Acute CHF (congestive heart failure) (HCC) 07/15/2020   Hypertension    Anterior epistaxis    Senile purpura (HCC) 06/15/2020   Ganglion of joint 06/12/2020   Hyperlipidemia 06/12/2020   Hypothyroidism due to acquired atrophy of thyroid 06/12/2020   Recurrent major depressive disorder, in partial remission (HCC) 06/12/2020   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/12/2020   Sleep apnea 06/12/2020   Elevated troponin 02/06/2020   Chronic respiratory failure with hypoxia (HCC) 12/22/2018   Immunosuppression (HCC) 12/22/2018   Pulmonary fibrosis (HCC) 12/21/2018   Bilateral carotid artery disease (HCC) 07/31/2016   Acute CVA (cerebrovascular accident) (HCC) 07/17/2016   Other abnormal glucose 11/25/2013   Prediabetes 11/25/2013   Rheumatoid arthritis with rheumatoid factor (HCC) 09/20/2013    ONSET DATE: referral date 07/15/22; patient reports gradual onset over past several years   REFERRING DIAG: dysphonia  THERAPY DIAG:  Dysarthria and anarthria  Cognitive communication deficit  Rationale for Evaluation and Treatment Rehabilitation  SUBJECTIVE:   SUBJECTIVE STATEMENT: Pt alert and cooperative. Flat affect. Endorsed inconsistent practice of HEP and reported pt's husband having continued difficulty hearing patient.  Pt accompanied by:  self  PERTINENT HISTORY: [name] is a 77 y.o. female with PMH including rheumatoid arthritis, hyperlipidemia, meningioma, OSA, CAD, memory changes, CVA, Acute on chronic hypoxemic respiratory failure, emphysema and pulmonary fibrosis, lingual swelling, CHF, and depression. 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 Patients husband reported speech problem is worse in PM with significant problems communicating. Patient reported changes in her voice quality and word-finding difficulty. Patient has reported speech and short-term memory difficulty, sometimes difficulty articulating and sometimes difficulty word finding. At this time, patients husband reported she is speaking more softly during the last month. He feels like it sounds as if her teeth are closed when she is trying to talk.  Patient had an MRI in June of 2022 with the following impressions: No acute or subacute infarct. Moderate chronic small vessel ischemia without detected progression and 6 mm left parafalcine calcified meningioma with mild growth from 2018 but no brain contact.   DIAGNOSTIC FINDINGS: MRI 07/22/22 IMPRESSION: 1. No acute intracranial abnormality. Chronic small ischemia in the brain with mild progression since a 2022 MRI. 2. A subcentimeter left para-falcine dural calcification or less likely meningioma appears stable and inconsequential.  PAIN:  Are you having pain? No   PATIENT GOALS      "I've got to get better."  OBJECTIVE:   TODAY'S TREATMENT: Continued training in HEP and building awareness of low vocal intensity. Spontaneous speech ranged 60-65 db at beginning of session. Pt completed loud /a/ x 10 average 75 dB with frequent mod cues, duration avg 10.3 seconds. Of note, pt did not have O2/[place] today, which likely contributed to reduced breath support and pt  reported feeling tired. Personally relevant phrases averaged 70 dB with occasional min-mod cues.  Progressed to spontaneous sentence responses avg 68 dB, improves to 70 dB with usual mod cues. Reduced carry-over of strategies continues. Encouraged pt to continued HEP for vocal intensity. Reviewed plan for MBSS scheduled for next week as pt reported difficulty swallowing particulate items like rice and grits.   PATIENT EDUCATION: Education details: must feel like shouting to be loud enough Person educated: Patient Education method: Explanation Education comprehension: needs further education   HOME EXERCISE PROGRAM: Provided at a previous session; continue  GOALS: Goals reviewed with patient? Yes  SHORT TERM GOALS: Target date: 10 sessions  Patient will demo HEP for dysarthria accurately with min cues. Baseline: Goal status: INITIAL  2.  Patient will maintain adequate vocal quality and intensity (>70dB) in sentence level responses 90% accuracy. Baseline:  Goal status: INITIAL  3.  Patient will use dysarthria compensations (slow, loud, overpronounce, pause) in 2-3 sentence responses 90% accuracy with moderate cues. Baseline:  Goal status: INITIAL     LONG TERM GOALS: Target date: 10/21/22  Patient will demo HEP for dysarthria independently. Baseline:  Goal status: INITIAL  2.   Patient will maintain intensity (avg >70dB) in 5 minutes conversation with rare min cues. Baseline:  Goal status: INITIAL  3.   Patient will use dysarthria compensations (slow, loud, overpronounce, pause) for intelligibility >90% in 5-8 minutes conversation. Baseline:  Goal status: INITIAL  4.  Patient will participate in MBSS for objective assessment of swallowing function Baseline:  Goal status: INITIAL   ASSESSMENT:  CLINICAL IMPRESSION: Patient is a 77 y.o. 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 Patient reports people constantly asking her to repeat herself, though she doesn't feel she is talking softly. Lingual tremor is noted, as well and tremors in both left and right hands. She also reports difficulty swallowing, including coughing/choking with liquids at least once per day. She sometimes feels there is food left over in her throat. Cognitive deficits impact pt's awareness and carryover of trained strategies. I recommend skilled ST to improve intelligibility and communication skills, as well as further assessment (MBS) of dysphagia, for improved interactions with family and friends and safety when swallowing. Suspect progressive neurologic etiology of communication, cognitive and swallowing impairments; patient has appointment with neurology in June.   OBJECTIVE IMPAIRMENTS include memory, expressive language, dysarthria, and dysphagia. These impairments are limiting patient from effectively communicating at home and in community and safety when swallowing. Factors affecting potential to achieve goals and functional outcome are severity of impairments. Patient will benefit from skilled SLP services to address above impairments and improve overall function.  REHAB POTENTIAL: Good  PLAN: SLP FREQUENCY: 2x/week  SLP DURATION: 12 weeks  PLANNED INTERVENTIONS: Aspiration precaution training, Pharyngeal strengthening exercises, Diet toleration management , Language facilitation, Trials of upgraded texture/liquids, Cueing hierachy, Internal/external aids, Functional tasks, Multimodal communication approach, SLP instruction and feedback, Compensatory strategies, Patient/family education, and Re-evaluation   Clyde Canterbury, M.S., CCC-SLP Speech-Language Pathologist [facility] [phone] Arnette Felts)    [facility] Outpatient Rehabilitation at [facility] [address] [place], [place], [address] Phone: [phone]    Fax:  [phone]

## 2022-08-21 ENCOUNTER — Ambulatory Visit: Payer: PPO | Admitting: Speech Pathology

## 2022-08-21 DIAGNOSIS — R471 Dysarthria and anarthria: Secondary | ICD-10-CM | POA: Diagnosis not present

## 2022-08-21 DIAGNOSIS — R41841 Cognitive communication deficit: Secondary | ICD-10-CM

## 2022-08-21 NOTE — Therapy (Signed)
OUTPATIENT SPEECH LANGUAGE PATHOLOGY TREATMENT  Patient Name: [name] MRN: [id number] DOB:1945-09-08, 77 y.o., female Today's Date: 08/21/2022  PCP: Lynnea Ferrier, MD  REFERRING PROVIDER: Vida Rigger, MD    End of Session - 08/21/22 1614     Visit Number 7    Number of Visits 25    Date for SLP Re-Evaluation 10/21/22    SLP Start Time 1407    SLP Stop Time  1500    SLP Time Calculation (min) 53 min    Activity Tolerance Patient tolerated treatment well             No past medical history on file.  Patient Active Problem List   Diagnosis Date Noted   Meningioma (HCC) 09/15/2020   Chronic diastolic CHF (congestive heart failure) (HCC) 09/15/2020   Memory difficulty 08/31/2020   History of stroke 08/31/2020   Falls frequently 08/31/2020   Ataxic dysarthria 08/31/2020   Acute CHF (congestive heart failure) (HCC) 07/15/2020   Hypertension    Anterior epistaxis    Senile purpura (HCC) 06/15/2020   Ganglion of joint 06/12/2020   Hyperlipidemia 06/12/2020   Hypothyroidism due to acquired atrophy of thyroid 06/12/2020   Recurrent major depressive disorder, in partial remission (HCC) 06/12/2020   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/12/2020   Sleep apnea 06/12/2020   Elevated troponin 02/06/2020   Chronic respiratory failure with hypoxia (HCC) 12/22/2018   Immunosuppression (HCC) 12/22/2018   Pulmonary fibrosis (HCC) 12/21/2018   Bilateral carotid artery disease (HCC) 07/31/2016   Acute CVA (cerebrovascular accident) (HCC) 07/17/2016   Other abnormal glucose 11/25/2013   Prediabetes 11/25/2013   Rheumatoid arthritis with rheumatoid factor (HCC) 09/20/2013    ONSET DATE: referral date 07/15/22; patient reports gradual onset over past several years   REFERRING DIAG: dysphonia  THERAPY DIAG:  Dysarthria and anarthria  Cognitive communication deficit  Rationale for Evaluation and Treatment Rehabilitation  SUBJECTIVE:   SUBJECTIVE STATEMENT: "I'm having  a hard time talking loud."  Pt accompanied by: self  PERTINENT HISTORY: [name] is a 77 y.o. female with PMH including rheumatoid arthritis, hyperlipidemia, meningioma, OSA, CAD, memory changes, CVA, Acute on chronic hypoxemic respiratory failure, emphysema and pulmonary fibrosis, lingual swelling, CHF, and depression. 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 Patients husband reported speech problem is worse in PM with significant problems communicating. Patient reported changes in her voice quality and word-finding difficulty. Patient has reported speech and short-term memory difficulty, sometimes difficulty articulating and sometimes difficulty word finding. At this time, patients husband reported she is speaking more softly during the last month. He feels like it sounds as if her teeth are closed when she is trying to talk.  Patient had an MRI in June of 2022 with the following impressions: No acute or subacute infarct. Moderate chronic small vessel ischemia without detected progression and 6 mm left parafalcine calcified meningioma with mild growth from 2018 but no brain contact.   DIAGNOSTIC FINDINGS: MRI 07/22/22 IMPRESSION: 1. No acute intracranial abnormality. Chronic small ischemia in the brain with mild progression since a 2022 MRI. 2. A subcentimeter left para-falcine dural calcification or less likely meningioma appears stable and inconsequential.  PAIN:  Are you having pain? No   PATIENT GOALS      "I've got to get better."  OBJECTIVE:   TODAY'S TREATMENT: Continued training in HEP and building awareness of low vocal intensity. Spontaneous speech averaged 62 db at beginning of session. Pt completed  loud /a/ x 10 average 75 dB with frequent mod cues, duration avg 7.6 seconds. Patient required usual mod-max cues for carryover of effort in structured and spontaneous tasks (verbal and visual). Personally relevant phrases  averaged 70 dB with usual mod cues. Pt has MBSS tomorrow.   PATIENT EDUCATION: Education details: must feel like shouting to be loud enough Person educated: Patient Education method: Explanation Education comprehension: needs further education   HOME EXERCISE PROGRAM: Provided at a previous session; continue  GOALS: Goals reviewed with patient? Yes  SHORT TERM GOALS: Target date: 10 sessions  Patient will demo HEP for dysarthria accurately with min cues. Baseline: Goal status: INITIAL  2.  Patient will maintain adequate vocal quality and intensity (>70dB) in sentence level responses 90% accuracy. Baseline:  Goal status: INITIAL  3.  Patient will use dysarthria compensations (slow, loud, overpronounce, pause) in 2-3 sentence responses 90% accuracy with moderate cues. Baseline:  Goal status: INITIAL     LONG TERM GOALS: Target date: 10/21/22  Patient will demo HEP for dysarthria independently. Baseline:  Goal status: INITIAL  2.   Patient will maintain intensity (avg >70dB) in 5 minutes conversation with rare min cues. Baseline:  Goal status: INITIAL  3.   Patient will use dysarthria compensations (slow, loud, overpronounce, pause) for intelligibility >90% in 5-8 minutes conversation. Baseline:  Goal status: INITIAL  4.  Patient will participate in MBSS for objective assessment of swallowing function Baseline:  Goal status: INITIAL   ASSESSMENT:  CLINICAL IMPRESSION: Patient is a 77 y.o. 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 Patient reports people constantly asking her to repeat herself, though she doesn't feel she is talking softly. Lingual tremor is noted, as well and tremors in both left and right hands. She also reports difficulty swallowing, including  coughing/choking with liquids at least once per day. She sometimes feels there is food left over in her throat. Cognitive deficits impact pt's awareness and carryover of trained strategies. I recommend skilled ST to improve intelligibility and communication skills, as well as further assessment (MBS) of dysphagia, for improved interactions with family and friends and safety when swallowing. Suspect progressive neurologic etiology of communication, cognitive and swallowing impairments; patient has appointment with neurology in June.   OBJECTIVE IMPAIRMENTS include memory, expressive language, dysarthria, and dysphagia. These impairments are limiting patient from effectively communicating at home and in community and safety when swallowing. Factors affecting potential to achieve goals and functional outcome are severity of impairments. Patient will benefit from skilled SLP services to address above impairments and improve overall function.  REHAB POTENTIAL: Good  PLAN: SLP FREQUENCY: 2x/week  SLP DURATION: 12 weeks  PLANNED INTERVENTIONS: Aspiration precaution training, Pharyngeal strengthening exercises, Diet toleration management , Language facilitation, Trials of upgraded texture/liquids, Cueing hierachy, Internal/external aids, Functional tasks, Multimodal communication approach, SLP instruction and feedback, Compensatory strategies, Patient/family education, and Re-evaluation  Rondel Baton, MS, CCC-SLP Speech-Language Pathologist [phone]     [facility] Outpatient Rehabilitation at [facility] [address] [place], [place], [address] Phone: [phone]   Fax:  [phone]

## 2022-08-22 ENCOUNTER — Ambulatory Visit
Admission: RE | Admit: 2022-08-22 | Discharge: 2022-08-22 | Disposition: A | Discharge status: Home / Self Care | Payer: PPO | Source: Ambulatory Visit | Attending: Pulmonary Disease | Admitting: Pulmonary Disease

## 2022-08-22 ENCOUNTER — Ambulatory Visit: Payer: PPO | Admitting: Speech Pathology

## 2022-08-22 DIAGNOSIS — R1310 Dysphagia, unspecified: Secondary | ICD-10-CM | POA: Diagnosis not present

## 2022-08-22 NOTE — Progress Notes (Signed)
Modified Barium Swallow Study  Patient Details  Name: [name] MRN: [id number] Date of Birth: October 18, 1945  Today's Date: 08/22/2022  Modified Barium Swallow completed.  Full report located under Chart Review in the Imaging Section.  History of Present Illness Pt is a 77 y.o. female with PMH including Memory Changes, rheumatoid arthritis, hyperlipidemia, meningioma, OSA, CAD, CVA, Acute on chronic hypoxemic respiratory failure, emphysema and pulmonary fibrosis, lingual swelling, CHF, and depression. 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 Patients husband reported speech problem is worse in PM with significant problems communicating. Patient reported changes in her voice quality and word-finding difficulty. Patient has reported speech and short-term memory difficulty, sometimes difficulty articulating and sometimes difficulty word finding. At this time, patients husband reported she is speaking more softly during the last month. He feels like it sounds as if her teeth are closed when she is trying to talk.  Patient had an MRI in June of 2022 with the following impressions: No acute or subacute infarct. Moderate chronic small vessel ischemia without detected progression and 6 mm left parafalcine calcified meningioma with mild growth from 2018 but no brain contact. Pt stated that she "sometimes" gets "choked" even when I am just talking". Pt denies having pneumonia -- no recent CXR in chart since 2022(per Imaging). Pt denies any significant changes in diet nor weight loss in recent 6 months.    Clinical Impression Patient appears to present w/ what appears to be mild-moderate, chronic oropharyngeal dysphagia w/ contributing factors of sensorimotor deficits, Cognitive deficits, generalized weakness, and structural changes.  Oral phase is characterized by min lingual weakness(moreso posterior), min disorganized lingual transport  initiation/coordination, and lingual (and occasional palate) residue post-swallow. Pt requires lingual sweeping and a 2nd, Dry swallow to clear oral cavity. Bolus Piecemealing noted w/ Larger boluses.  Swallow initiation is consistently delayed to the level of spilling to the pyriform sinuses w/ liquids -- thin, nectar. This results in Laryngeal Penetration of these liquid consistencies. W/ completion of swallow and/or throat clear w/ f/u Dry swallow, the Penetrated material appeared to clear the vestibule. NO Aspiration was noted during this study.  Pharyngeal phase is noted for mildly decreased base of tongue retraction, min decreased hyolaryngeal excursion, and mildly decreased stripping wave resulting in min+ residue collection in the valleculae, pyriform sinuses, aryepiglottic folds, and along the posterior pharyngeal wall -- moreso w/ solids. F/u Dry swallow was effective in reducing and/or clearing pharyngeal residue. Pt was able to follow the strategies of Throat Clear and Dry Swallow w/ verbal cue -- suspect she will need Supervision at meals for consistent follow through.  A prominent cricopharyngeus muscle/protrusion along the posterior pharyngeal wall was apparent during the swallow -- this did not appear to impede bolus motility/clearing of any consistency tested.   Reviewed study findings w/ pt using images and model/cues. Education/handouts provided on general aspiration precautions and strategies as well as suggested use of the Rije Dysphagia Drink Cup for better flow control and monitoring of sip size to pt/Caregiver. Recommend continued f/u Outpatient ST services to discuss/address education of precautions/strategies to reduce risk for aspiration and negative sequelae of such. Recommended to Caregiver that pt may need Supervision and cuing to implement strategies consistently. Pt and Caregiver verbalized understanding of study results and recommendations today. Recommend fairly Regular  consistency diet w/ easy to chew foods(moistened foods), thin liquids via Cup(Dysphagia Drink Cup optimal), Pills in a PUREE for safer swallowing. Pt/Caregiver agreed. Factors that may  increase risk of adverse event in presence of aspiration Rubye Oaks & Clearance Coots 2021): Frail or deconditioned (RA in hands; suspected Neurologic decline)  Swallow Evaluation Recommendations Recommendations: PO diet PO Diet Recommendation: Regular;Thin liquids (Level 0) (cut/moistened, soft foods -- easy to chew) Liquid Administration via: Cup;No straw Medication Administration: Whole meds with puree Supervision: Patient able to self-feed;Set-up assistance for safety (support at meals as needed) Swallowing strategies  : Minimize environmental distractions;Slow rate;Small bites/sips;Multiple dry swallows after each bite/sip;Clear throat intermittently Postural changes: Position pt fully upright for meals;Stay upright 30-60 min after meals Oral care recommendations: Oral care BID (2x/day);Oral care before PO;Pt independent with oral care (setup support) Recommended consults: Consider dietitian consultation (F/u w/ Neurologist -- appt is scheduled per pt/chart)      Jerilynn Som, MS, CCC-SLP Speech Language Pathologist Rehab Services; [facility] [phone] (ascom) Hallis Meditz 08/22/2022,6:01 PM

## 2022-08-28 ENCOUNTER — Ambulatory Visit: Payer: PPO | Admitting: Speech Pathology

## 2022-09-03 ENCOUNTER — Ambulatory Visit: Payer: PPO | Attending: Pulmonary Disease | Admitting: Speech Pathology

## 2022-09-03 DIAGNOSIS — R41841 Cognitive communication deficit: Secondary | ICD-10-CM | POA: Insufficient documentation

## 2022-09-03 DIAGNOSIS — R471 Dysarthria and anarthria: Secondary | ICD-10-CM | POA: Insufficient documentation

## 2022-09-03 NOTE — Therapy (Signed)
OUTPATIENT SPEECH LANGUAGE PATHOLOGY TREATMENT  Patient Name: [name] MRN: [id number] DOB:November 09, 1945, 77 y.o., female Today's Date: 09/03/2022  PCP: Mckenzie Ferrier, MD  REFERRING PROVIDER: Vida Rigger, MD    End of Session - 09/03/22 1607     Visit Number 8    Number of Visits 25    Date for SLP Re-Evaluation 10/21/22    SLP Start Time 1600    SLP Stop Time  1700    SLP Time Calculation (min) 60 min    Activity Tolerance Patient tolerated treatment well             No past medical history on file.  Patient Active Problem List   Diagnosis Date Noted   Meningioma (HCC) 09/15/2020   Chronic diastolic CHF (congestive heart failure) (HCC) 09/15/2020   Memory difficulty 08/31/2020   History of stroke 08/31/2020   Falls frequently 08/31/2020   Ataxic dysarthria 08/31/2020   Acute CHF (congestive heart failure) (HCC) 07/15/2020   Hypertension    Anterior epistaxis    Senile purpura (HCC) 06/15/2020   Ganglion of joint 06/12/2020   Hyperlipidemia 06/12/2020   Hypothyroidism due to acquired atrophy of thyroid 06/12/2020   Recurrent major depressive disorder, in partial remission (HCC) 06/12/2020   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/12/2020   Sleep apnea 06/12/2020   Elevated troponin 02/06/2020   Chronic respiratory failure with hypoxia (HCC) 12/22/2018   Immunosuppression (HCC) 12/22/2018   Pulmonary fibrosis (HCC) 12/21/2018   Bilateral carotid artery disease (HCC) 07/31/2016   Acute CVA (cerebrovascular accident) (HCC) 07/17/2016   Other abnormal glucose 11/25/2013   Prediabetes 11/25/2013   Rheumatoid arthritis with rheumatoid factor (HCC) 09/20/2013    ONSET DATE: referral date 07/15/22; patient reports gradual onset over past several years   REFERRING DIAG: dysphonia  THERAPY DIAG:  Dysarthria and anarthria  Cognitive communication deficit  Rationale for Evaluation and Treatment Rehabilitation  SUBJECTIVE:   SUBJECTIVE STATEMENT: "I'm trying  to wait until we get started."  Pt accompanied by: self  PERTINENT HISTORY: [name] is a 77 y.o. female with PMH including rheumatoid arthritis, hyperlipidemia, meningioma, OSA, CAD, memory changes, CVA, Acute on chronic hypoxemic respiratory failure, emphysema and pulmonary fibrosis, lingual swelling, CHF, and depression. 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 Patients husband reported speech problem is worse in PM with significant problems communicating. Patient reported changes in her voice quality and word-finding difficulty. Patient has reported speech and short-term memory difficulty, sometimes difficulty articulating and sometimes difficulty word finding. At this time, patients husband reported she is speaking more softly during the last month. He feels like it sounds as if her teeth are closed when she is trying to talk.  Patient had an MRI in June of 2022 with the following impressions: No acute or subacute infarct. Moderate chronic small vessel ischemia without detected progression and 6 mm left parafalcine calcified meningioma with mild growth from 2018 but no brain contact.   DIAGNOSTIC FINDINGS: MRI 07/22/22 IMPRESSION: 1. No acute intracranial abnormality. Chronic small ischemia in the brain with mild progression since a 2022 MRI. 2. A subcentimeter left para-falcine dural calcification or less likely meningioma appears stable and inconsequential.  PAIN:  Are you having pain? No   PATIENT GOALS      "I've got to get better."  OBJECTIVE:   TODAY'S TREATMENT:  Pt required cues to implement louder voice in initial conversation. Reviewed swallow study results; pt has flyer on the [employer]  cup but has not yet purchased this. Reports she is sometimes "scared to drink because I'm afraid I'll get choked." Rheumatoid arthritis also limits her self-feeding abilities. Pt required cues for single, small sips. Cough x2 with  multiple sips, but no overt s/sx aspiration with small single "hot coffee" sips. Continued training in HEP and building awareness of low vocal intensity. Pt completed loud /a/ x 10 average 80 dB with occasional min-mod cues, duration avg 12.5 seconds. Patient required min-mod cues for carryover of effort in picture description task, avg 72 dB. Personally relevant phrases averaged 72 dB with occasional min cues. With cognitive load, level of cuing increases significantly. Caregiver reports poor carryover outside of practice times.   PATIENT EDUCATION: Education details: must feel like shouting to be loud enough Person educated: Patient Education method: Explanation Education comprehension: needs further education   HOME EXERCISE PROGRAM: Provided at a previous session; continue  GOALS: Goals reviewed with patient? Yes  SHORT TERM GOALS: Target date: 10 sessions  Patient will demo HEP for dysarthria accurately with min cues. Baseline: 09/03/22: min-mod cues, variable session-to-session Goal status: IN PROGRESS  2.  Patient will maintain adequate vocal quality and intensity (>70dB) in sentence level responses 90% accuracy. Baseline: 09/03/22: 72 dB, remains variable Goal status: IN PROGRESS  3.  Patient will use dysarthria compensations (slow, loud, overpronounce, pause) in 2-3 sentence responses 90% accuracy with moderate cues. Baseline: 09/03/22 max cues Goal status: IN PROGRESS     LONG TERM GOALS: Target date: 10/21/22  Patient will demo HEP for dysarthria independently. Baseline:  Goal status: IN PROGRESS  2.   Patient will maintain intensity (avg >70dB) in 5 minutes conversation with rare min cues. Baseline:  Goal status: IN PROGRESS  3.   Patient will use dysarthria compensations (slow, loud, overpronounce, pause) for intelligibility >90% in 5-8 minutes conversation. Baseline:  Goal status: IN PROGRESS  4.  Patient will participate in MBSS for objective assessment of  swallowing function Baseline:  Goal status: MET   ASSESSMENT:  CLINICAL IMPRESSION: Patient is a 77 y.o. female who presents with hypokinetic dysarthria, characterized by low vocal intensity, low pitch, monopitch, hypoarticulation, mild hypernasality, short rushes of speech due to reduced breath support, and hoarse vocal quality. Patient reports people constantly asking her to repeat herself, though she doesn't feel she is talking softly. Lingual tremor is noted, as well and tremors in both left and right hands. She also reports difficulty swallowing, including coughing/choking with liquids at least once per day. MBSS completed 08/22/22, see imaging section for details. Cognitive deficits impact pt's awareness and carryover of trained strategies. I recommend skilled ST to improve intelligibility and communication skills, as well as use of swallowing precautions for improved interactions with family and friends and safety when swallowing. Suspect progressive neurologic etiology of communication, cognitive and swallowing impairments; patient has appointment with neurology in June.   OBJECTIVE IMPAIRMENTS include memory, expressive language, dysarthria, and dysphagia. These impairments are limiting patient from effectively communicating at home and in community and safety when swallowing. Factors affecting potential to achieve goals and functional outcome are severity of impairments. Patient will benefit from skilled SLP services to address above impairments and improve overall function.  REHAB POTENTIAL: Good  PLAN: SLP FREQUENCY: 2x/week  SLP DURATION: 12 weeks  PLANNED INTERVENTIONS: Aspiration precaution training, Pharyngeal strengthening exercises, Diet toleration management , Language facilitation, Trials of upgraded texture/liquids, Cueing hierachy, Internal/external aids, Functional tasks, Multimodal communication approach, SLP instruction and feedback, Compensatory strategies, Patient/family  education, and  Re-evaluation  Rondel Baton, MS, Corporate investment banker Pathologist [phone]     [facility] at [facility] [address] [place], [place], [address] Phone: [phone]   Fax:  [phone]

## 2022-09-05 ENCOUNTER — Ambulatory Visit: Payer: PPO | Admitting: Speech Pathology

## 2022-09-05 DIAGNOSIS — R41841 Cognitive communication deficit: Secondary | ICD-10-CM

## 2022-09-05 DIAGNOSIS — R471 Dysarthria and anarthria: Secondary | ICD-10-CM | POA: Diagnosis not present

## 2022-09-05 NOTE — Therapy (Signed)
OUTPATIENT SPEECH LANGUAGE PATHOLOGY TREATMENT  Patient Name: [name] MRN: [id number] DOB:07-12-1945, 77 y.o., female Today's Date: 09/05/2022  PCP: Lynnea Ferrier, MD  REFERRING PROVIDER: Vida Rigger, MD    End of Session - 09/05/22 1508     Visit Number 9    Number of Visits 25    Date for SLP Re-Evaluation 10/21/22    SLP Start Time 1505    SLP Stop Time  1600    SLP Time Calculation (min) 55 min    Activity Tolerance Patient tolerated treatment well             No past medical history on file.  Patient Active Problem List   Diagnosis Date Noted   Meningioma (HCC) 09/15/2020   Chronic diastolic CHF (congestive heart failure) (HCC) 09/15/2020   Memory difficulty 08/31/2020   History of stroke 08/31/2020   Falls frequently 08/31/2020   Ataxic dysarthria 08/31/2020   Acute CHF (congestive heart failure) (HCC) 07/15/2020   Hypertension    Anterior epistaxis    Senile purpura (HCC) 06/15/2020   Ganglion of joint 06/12/2020   Hyperlipidemia 06/12/2020   Hypothyroidism due to acquired atrophy of thyroid 06/12/2020   Recurrent major depressive disorder, in partial remission (HCC) 06/12/2020   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/12/2020   Sleep apnea 06/12/2020   Elevated troponin 02/06/2020   Chronic respiratory failure with hypoxia (HCC) 12/22/2018   Immunosuppression (HCC) 12/22/2018   Pulmonary fibrosis (HCC) 12/21/2018   Bilateral carotid artery disease (HCC) 07/31/2016   Acute CVA (cerebrovascular accident) (HCC) 07/17/2016   Other abnormal glucose 11/25/2013   Prediabetes 11/25/2013   Rheumatoid arthritis with rheumatoid factor (HCC) 09/20/2013    ONSET DATE: referral date 07/15/22; patient reports gradual onset over past several years   REFERRING DIAG: dysphonia  THERAPY DIAG:  Dysarthria and anarthria  Cognitive communication deficit  Rationale for Evaluation and Treatment Rehabilitation  SUBJECTIVE:   SUBJECTIVE STATEMENT: Pt greeted  SLP with loud voice  Pt accompanied by: self  PERTINENT HISTORY: [name] is a 77 y.o. female with PMH including rheumatoid arthritis, hyperlipidemia, meningioma, OSA, CAD, memory changes, CVA, Acute on chronic hypoxemic respiratory failure, emphysema and pulmonary fibrosis, lingual swelling, CHF, and depression. 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 Patients husband reported speech problem is worse in PM with significant problems communicating. Patient reported changes in her voice quality and word-finding difficulty. Patient has reported speech and short-term memory difficulty, sometimes difficulty articulating and sometimes difficulty word finding. At this time, patients husband reported she is speaking more softly during the last month. He feels like it sounds as if her teeth are closed when she is trying to talk.  Patient had an MRI in June of 2022 with the following impressions: No acute or subacute infarct. Moderate chronic small vessel ischemia without detected progression and 6 mm left parafalcine calcified meningioma with mild growth from 2018 but no brain contact.   DIAGNOSTIC FINDINGS: MRI 07/22/22 IMPRESSION: 1. No acute intracranial abnormality. Chronic small ischemia in the brain with mild progression since a 2022 MRI. 2. A subcentimeter left para-falcine dural calcification or less likely meningioma appears stable and inconsequential.  PAIN:  Are you having pain? No   PATIENT GOALS      "I've got to get better."  OBJECTIVE:   TODAY'S TREATMENT: Pt completed HEP with frequent visual cues for steady pitch/loudness vs "pulsing" (pitch flucutating 157-213 on some reps). Avg: duration 10.7 seconds, 81  dB. Personal phrases initially 71 dB, with auditory feedback and multiple reps, increased to average 73 dB; pt benefitted from pause midway due to reduced breath support. Short spontaneous responses averaged 71 dB with  occasional mod cues. With longer responses (>6-7 words), reduced breath support contributes to fading vocal intensity.    PATIENT EDUCATION: Education details: must feel like shouting to be loud enough Person educated: Patient Education method: Explanation Education comprehension: needs further education   HOME EXERCISE PROGRAM: Provided at a previous session; continue  GOALS: Goals reviewed with patient? Yes  SHORT TERM GOALS: Target date: 10 sessions  Patient will demo HEP for dysarthria accurately with min cues. Baseline: 09/03/22: min-mod cues, variable session-to-session Goal status: IN PROGRESS  2.  Patient will maintain adequate vocal quality and intensity (>70dB) in sentence level responses 90% accuracy. Baseline: 09/03/22: 72 dB, remains variable Goal status: IN PROGRESS  3.  Patient will use dysarthria compensations (slow, loud, overpronounce, pause) in 2-3 sentence responses 90% accuracy with moderate cues. Baseline: 09/03/22 max cues Goal status: IN PROGRESS     LONG TERM GOALS: Target date: 10/21/22  Patient will demo HEP for dysarthria independently. Baseline:  Goal status: IN PROGRESS  2.   Patient will maintain intensity (avg >70dB) in 5 minutes conversation with rare min cues. Baseline:  Goal status: IN PROGRESS  3.   Patient will use dysarthria compensations (slow, loud, overpronounce, pause) for intelligibility >90% in 5-8 minutes conversation. Baseline:  Goal status: IN PROGRESS  4.  Patient will participate in MBSS for objective assessment of swallowing function Baseline:  Goal status: MET   ASSESSMENT:  CLINICAL IMPRESSION: Patient is a 77 y.o. female who presents with hypokinetic dysarthria, characterized by low vocal intensity, low pitch, monopitch, hypoarticulation, mild hypernasality, short rushes of speech due to reduced breath support, and hoarse vocal quality. Patient reports people constantly asking her to repeat herself, though she  doesn't feel she is talking softly. Lingual tremor is noted, as well and tremors in both left and right hands. She also reports difficulty swallowing, including coughing/choking with liquids at least once per day. MBSS completed 08/22/22, see imaging section for details. Cognitive deficits impact pt's awareness and carryover of trained strategies; caregiver does not attend session, however is cuing pt for louder speech at home and assisting with HEP. If carryover does not improve, and family member/caregiver does not attend with patient, will consider decreasing frequency or d/c. I recommend skilled ST to improve intelligibility and communication skills, as well as use of swallowing precautions for improved interactions with family and friends and safety when swallowing. Suspect progressive neurologic etiology of communication, cognitive and swallowing impairments; patient has appointment with neurology in June.   OBJECTIVE IMPAIRMENTS include memory, expressive language, dysarthria, and dysphagia. These impairments are limiting patient from effectively communicating at home and in community and safety when swallowing. Factors affecting potential to achieve goals and functional outcome are severity of impairments. Patient will benefit from skilled SLP services to address above impairments and improve overall function.  REHAB POTENTIAL: Good  PLAN: SLP FREQUENCY: 1-2x/week  SLP DURATION: 12 weeks  PLANNED INTERVENTIONS: Aspiration precaution training, Pharyngeal strengthening exercises, Diet toleration management , Language facilitation, Trials of upgraded texture/liquids, Cueing hierachy, Internal/external aids, Functional tasks, Multimodal communication approach, SLP instruction and feedback, Compensatory strategies, Patient/family education, and Re-evaluation  Rondel Baton, MS, Corporate investment banker Pathologist [phone]     [facility] at  [facility] [address] [place], [place], [address] Phone:  [phone]   Fax:  [phone]

## 2022-09-05 NOTE — Patient Instructions (Signed)
Extra Practice:  When you watch 25 Words or Less, Wheel of Fortune, [place], and Family Feud: Say your guesses out loud. SHOUT! Then try to keep using this voice.   Tell Doug, "Welcome Back!"

## 2022-09-10 ENCOUNTER — Ambulatory Visit: Payer: PPO | Admitting: Speech Pathology

## 2022-09-10 DIAGNOSIS — R471 Dysarthria and anarthria: Secondary | ICD-10-CM | POA: Diagnosis not present

## 2022-09-10 DIAGNOSIS — R41841 Cognitive communication deficit: Secondary | ICD-10-CM

## 2022-09-10 NOTE — Therapy (Signed)
OUTPATIENT SPEECH LANGUAGE PATHOLOGY TREATMENT 10TH VISIT PROGRESS NOTE  Patient Name: [name] MRN: [id number] DOB:1945/11/05, 77 y.o., female Today's Date: 09/10/2022  Speech Therapy Progress Note  Dates of Reporting Period: 07/23/2022 to 09/10/2022  Objective: Patient has been seen for 10 speech therapy sessions this reporting period targeting flaccid dysphonia. Patient is making progress toward  her STG and LTGs . See skilled intervention, clinical impressions, and goals below for details.   PCP: Mckenzie Ferrier, MD  REFERRING PROVIDER: Vida Rigger, MD    End of Session - 09/10/22 1540     Visit Number 10    Number of Visits 25    Date for SLP Re-Evaluation 10/21/22    Progress Note Due on Visit 10    SLP Start Time 1530    SLP Stop Time  1615    SLP Time Calculation (min) 45 min    Activity Tolerance Patient tolerated treatment well             No past medical history on file.  Patient Active Problem List   Diagnosis Date Noted   Meningioma (HCC) 09/15/2020   Chronic diastolic CHF (congestive heart failure) (HCC) 09/15/2020   Memory difficulty 08/31/2020   History of stroke 08/31/2020   Falls frequently 08/31/2020   Ataxic dysarthria 08/31/2020   Acute CHF (congestive heart failure) (HCC) 07/15/2020   Hypertension    Anterior epistaxis    Senile purpura (HCC) 06/15/2020   Ganglion of joint 06/12/2020   Hyperlipidemia 06/12/2020   Hypothyroidism due to acquired atrophy of thyroid 06/12/2020   Recurrent major depressive disorder, in partial remission (HCC) 06/12/2020   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/12/2020   Sleep apnea 06/12/2020   Elevated troponin 02/06/2020   Chronic respiratory failure with hypoxia (HCC) 12/22/2018   Immunosuppression (HCC) 12/22/2018   Pulmonary fibrosis (HCC) 12/21/2018   Bilateral carotid artery disease (HCC) 07/31/2016   Acute CVA (cerebrovascular accident) (HCC) 07/17/2016   Other abnormal glucose 11/25/2013    Prediabetes 11/25/2013   Rheumatoid arthritis with rheumatoid factor (HCC) 09/20/2013    ONSET DATE: referral date 07/15/22; patient reports gradual onset over past several years   REFERRING DIAG: dysphonia  THERAPY DIAG:  Dysarthria and anarthria  Cognitive communication deficit  Rationale for Evaluation and Treatment Rehabilitation  SUBJECTIVE:   SUBJECTIVE STATEMENT: Pt required cuing from caregiver to use loud voice when greeting this writer  Pt accompanied by: self  PERTINENT HISTORY: [name] is a 77 y.o. female with PMH including rheumatoid arthritis, hyperlipidemia, meningioma, OSA, CAD, memory changes, CVA, Acute on chronic hypoxemic respiratory failure, emphysema and pulmonary fibrosis, lingual swelling, CHF, and depression. 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 Patients husband reported speech problem is worse in PM with significant problems communicating. Patient reported changes in her voice quality and word-finding difficulty. Patient has reported speech and short-term memory difficulty, sometimes difficulty articulating and sometimes difficulty word finding. At this time, patients husband reported she is speaking more softly during the last month. He feels like it sounds as if her teeth are closed when she is trying to talk.  Patient had an MRI in June of 2022 with the following impressions: No acute or subacute infarct. Moderate chronic small vessel ischemia without detected progression and 6 mm left parafalcine calcified meningioma with mild growth from 2018 but no brain contact.   DIAGNOSTIC FINDINGS: MRI 07/22/22 IMPRESSION: 1. No acute intracranial abnormality. Chronic small ischemia in the brain with  mild progression since a 2022 MRI. 2. A subcentimeter left para-falcine dural calcification or less likely meningioma appears stable and inconsequential.  PAIN:  Are you having pain? No   PATIENT  GOALS      "I've got to get better."  OBJECTIVE:   TODAY'S TREATMENT:  Skilled treatment session focused on pt's speech goals. SLP facilitated session by providing maximal faded to moderate verbal cues to perform "ah" with smooth vocal quality at 80dB with average length of 8.5 seconds. Pt able to complete 3 times before becoming SOB. When reading personal list of functional phrases, pt able to read 3 phrases before becoming SOB. With moderate verbal cues, she was able to achieve 77.5 dB.   In addition, SLP facilitated session by asking pt to fill out the Communication Effectiveness Survey.     The Communication Effectiveness Survey is a patient-reported outcome measure in which the patient rates their own effectiveness in different communication situations. A higher score indicates greater effectiveness.   Pt's self-rating was 16/32.   Having a conversation with a family member or friends at home. 2 Participating in conversation with strangers in a quiet place. 2 Conversing with a familiar person over the telephone. 3 Conversing with a stranger over the telephone. 3 Being part of a conversation in a noisy environment (social gathering). 2 Speaking to a friend when you are emotionally upset or you are angry. 1- Not at all effective Having a conversation while traveling in a car. 2 Having a conversation with someone at a distance (across a room). 1- Not at all effective   PATIENT EDUCATION: Education details: must feel like shouting to be loud enough Person educated: Patient Education method: Explanation Education comprehension: needs further education   HOME EXERCISE PROGRAM: Provided at a previous session; continue  GOALS: Goals reviewed with patient? Yes  SHORT TERM GOALS: Target date: 10 sessions  Patient will demo HEP for dysarthria accurately with min cues. Baseline: 09/03/22: min-mod cues, variable session-to-session Goal status: IN PROGRESS  2.  Patient will maintain  adequate vocal quality and intensity (>70dB) in sentence level responses 90% accuracy. Baseline: 09/03/22: 72 dB, remains variable Goal status: IN PROGRESS  3.  Patient will use dysarthria compensations (slow, loud, overpronounce, pause) in 2-3 sentence responses 90% accuracy with moderate cues. Baseline: 09/03/22 max cues Goal status: IN PROGRESS  4.   To determine optimal resistance levels for Respiratory Muscle Training (RMT) for improving increase hyolaryngeal elevation and strengthen cough for airway clearance, patient will participate in evaluation (and re-assessment as needed) of maximum expiratory pressure (MEP) and maximum inspiratory pressure (MIP) at next therapy session.   Goal status: INITIAL  LONG TERM GOALS: Target date: 10/21/22  Patient will demo HEP for dysarthria independently. Baseline:  Goal status: IN PROGRESS  2.   Patient will maintain intensity (avg >70dB) in 5 minutes conversation with rare min cues. Baseline:  Goal status: IN PROGRESS  3.   Patient will use dysarthria compensations (slow, loud, overpronounce, pause) for intelligibility >90% in 5-8 minutes conversation. Baseline:  Goal status: IN PROGRESS  4.  Patient will participate in MBSS for objective assessment of swallowing function Baseline:  Goal status: MET   ASSESSMENT:  CLINICAL IMPRESSION: Patient is a 77 y.o. female who presents with hypokinetic dysarthria, characterized by low vocal intensity, low pitch, monopitch, hypoarticulation, mild hypernasality, short rushes of speech due to reduced breath support, and hoarse vocal quality. Pt is making slower than anticipated progress. Recommend possibly adding Respiratory Muscle Training. Secure chat  sent to pt's pulmonologist [name]) who replied that he approved of trying.    OBJECTIVE IMPAIRMENTS include memory, expressive language, dysarthria, and dysphagia. These impairments are limiting patient from effectively communicating at home and in  community and safety when swallowing. Factors affecting potential to achieve goals and functional outcome are severity of impairments. Patient will benefit from skilled SLP services to address above impairments and improve overall function.  REHAB POTENTIAL: Good  PLAN: SLP FREQUENCY: 1-2x/week  SLP DURATION: 12 weeks  PLANNED INTERVENTIONS: Aspiration precaution training, Pharyngeal strengthening exercises, Diet toleration management , Language facilitation, Trials of upgraded texture/liquids, Cueing hierachy, Internal/external aids, Functional tasks, Multimodal communication approach, SLP instruction and feedback, Compensatory strategies, Patient/family education, and Re-evaluation   Kaizer Dissinger B. Dreama Saa, M.S., CCC-SLP, Tree surgeon Certified Brain Injury Specialist [facility]  [facility] Rehabilitation Services Office [phone] Ascom [phone] Fax [phone]

## 2022-09-12 ENCOUNTER — Ambulatory Visit: Payer: PPO | Admitting: Speech Pathology

## 2022-09-12 DIAGNOSIS — R41841 Cognitive communication deficit: Secondary | ICD-10-CM

## 2022-09-12 DIAGNOSIS — R471 Dysarthria and anarthria: Secondary | ICD-10-CM

## 2022-09-12 NOTE — Therapy (Signed)
OUTPATIENT SPEECH LANGUAGE PATHOLOGY TREATMENT  Patient Name: [name] MRN: [id number] DOB:1945-12-12, 77 y.o., female Today's Date: 09/12/2022   PCP: Lynnea Ferrier, MD  REFERRING PROVIDER: Vida Rigger, MD    End of Session - 09/14/22 1103     Visit Number 11    Number of Visits 25    Date for SLP Re-Evaluation 10/21/22    Progress Note Due on Visit 10    SLP Start Time 1530    SLP Stop Time  1605    SLP Time Calculation (min) 35 min    Activity Tolerance Patient tolerated treatment well             No past medical history on file.  Patient Active Problem List   Diagnosis Date Noted   Meningioma (HCC) 09/15/2020   Chronic diastolic CHF (congestive heart failure) (HCC) 09/15/2020   Memory difficulty 08/31/2020   History of stroke 08/31/2020   Falls frequently 08/31/2020   Ataxic dysarthria 08/31/2020   Acute CHF (congestive heart failure) (HCC) 07/15/2020   Hypertension    Anterior epistaxis    Senile purpura (HCC) 06/15/2020   Ganglion of joint 06/12/2020   Hyperlipidemia 06/12/2020   Hypothyroidism due to acquired atrophy of thyroid 06/12/2020   Recurrent major depressive disorder, in partial remission (HCC) 06/12/2020   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/12/2020   Sleep apnea 06/12/2020   Elevated troponin 02/06/2020   Chronic respiratory failure with hypoxia (HCC) 12/22/2018   Immunosuppression (HCC) 12/22/2018   Pulmonary fibrosis (HCC) 12/21/2018   Bilateral carotid artery disease (HCC) 07/31/2016   Acute CVA (cerebrovascular accident) (HCC) 07/17/2016   Other abnormal glucose 11/25/2013   Prediabetes 11/25/2013   Rheumatoid arthritis with rheumatoid factor (HCC) 09/20/2013    ONSET DATE: referral date 07/15/22; patient reports gradual onset over past several years   REFERRING DIAG: dysphonia  THERAPY DIAG:  Dysarthria and anarthria  Cognitive communication deficit  Rationale for Evaluation and Treatment Rehabilitation  SUBJECTIVE:    Pt accompanied by: self  PERTINENT HISTORY: [name] is a 77 y.o. female with PMH including rheumatoid arthritis, hyperlipidemia, meningioma, OSA, CAD, memory changes, CVA, Acute on chronic hypoxemic respiratory failure, emphysema and pulmonary fibrosis, lingual swelling, CHF, and depression. 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 Patients husband reported speech problem is worse in PM with significant problems communicating. Patient reported changes in her voice quality and word-finding difficulty. Patient has reported speech and short-term memory difficulty, sometimes difficulty articulating and sometimes difficulty word finding. At this time, patients husband reported she is speaking more softly during the last month. He feels like it sounds as if her teeth are closed when she is trying to talk.  Patient had an MRI in June of 2022 with the following impressions: No acute or subacute infarct. Moderate chronic small vessel ischemia without detected progression and 6 mm left parafalcine calcified meningioma with mild growth from 2018 but no brain contact.   DIAGNOSTIC FINDINGS: MRI 07/22/22 IMPRESSION: 1. No acute intracranial abnormality. Chronic small ischemia in the brain with mild progression since a 2022 MRI. 2. A subcentimeter left para-falcine dural calcification or less likely meningioma appears stable and inconsequential.  PAIN:  Are you having pain? No   PATIENT GOALS      "I've got to get better."  SUBJECTIVE STATEMENT: Pt repeats herself throughout the session  OBJECTIVE:   TODAY'S TREATMENT:  Skilled treatment session focused on pt's speech goals. SLP facilitated session by  providing instruction and maximal initiation cues (verbal, visual, tactile) to use EMST 75 device. Specifically, pt had difficulty following directions for placement in mouth and sequencing quick burst of air. Pt with responses suspicious  for potential cognitive deficits. With extensive repetition, pt able to produce quick burst of air with EMST set at 15 cmH2O and self-perceived effort level of 7 out of 10. During the initial portion of the exam, pt with improved loudness of ~ 75dB with no increase in WOB. This however declined over the course of the session requiring pt to repeat herself multiple times.      PATIENT EDUCATION: Education details: EMST Person educated: Patient Education method: Explanation Education comprehension: needs further education   HOME EXERCISE PROGRAM: Provided at a previous session; continue  GOALS: Goals reviewed with patient? Yes  SHORT TERM GOALS: Target date: 10 sessions  Patient will demo HEP for dysarthria accurately with min cues. Baseline: 09/03/22: min-mod cues, variable session-to-session Goal status: IN PROGRESS  2.  Patient will maintain adequate vocal quality and intensity (>70dB) in sentence level responses 90% accuracy. Baseline: 09/03/22: 72 dB, remains variable Goal status: IN PROGRESS  3.  Patient will use dysarthria compensations (slow, loud, overpronounce, pause) in 2-3 sentence responses 90% accuracy with moderate cues. Baseline: 09/03/22 max cues Goal status: IN PROGRESS  4.   To determine optimal resistance levels for Respiratory Muscle Training (RMT) for improving increase hyolaryngeal elevation and strengthen cough for airway clearance, patient will participate in evaluation (and re-assessment as needed) of maximum expiratory pressure (MEP) and maximum inspiratory pressure (MIP) at next therapy session.   Goal status: INITIAL  LONG TERM GOALS: Target date: 10/21/22  Patient will demo HEP for dysarthria independently. Baseline:  Goal status: IN PROGRESS  2.   Patient will maintain intensity (avg >70dB) in 5 minutes conversation with rare min cues. Baseline:  Goal status: IN PROGRESS  3.   Patient will use dysarthria compensations (slow, loud, overpronounce,  pause) for intelligibility >90% in 5-8 minutes conversation. Baseline:  Goal status: IN PROGRESS  4.  Patient will participate in MBSS for objective assessment of swallowing function Baseline:  Goal status: MET   ASSESSMENT:  CLINICAL IMPRESSION: Patient is a 77 y.o. female who presents with hypokinetic dysarthria, characterized by low vocal intensity, low pitch, monopitch, hypoarticulation, mild hypernasality, short rushes of speech due to reduced breath support, and hoarse vocal quality. Pt is making slower than anticipated progress.   Pt eager to learn and use EMST device for respiratory muscle strengthening. While pt was able to perform exercises at the end of the session, suspect she will need extensive repetition in upcoming sessions.   OBJECTIVE IMPAIRMENTS include memory, expressive language, dysarthria, and dysphagia. These impairments are limiting patient from effectively communicating at home and in community and safety when swallowing. Factors affecting potential to achieve goals and functional outcome are severity of impairments. Patient will benefit from skilled SLP services to address above impairments and improve overall function.  REHAB POTENTIAL: Good  PLAN: SLP FREQUENCY: 1-2x/week  SLP DURATION: 12 weeks  PLANNED INTERVENTIONS: Aspiration precaution training, Pharyngeal strengthening exercises, Diet toleration management , Language facilitation, Trials of upgraded texture/liquids, Cueing hierachy, Internal/external aids, Functional tasks, Multimodal communication approach, SLP instruction and feedback, Compensatory strategies, Patient/family education, and Re-evaluation   Wadie Liew B. Dreama Saa, M.S., CCC-SLP, Tree surgeon Certified Brain Injury Specialist [facility]  [facility] Rehabilitation Services Office [phone] Ascom [phone] Fax [phone]

## 2022-09-17 ENCOUNTER — Ambulatory Visit: Payer: PPO | Admitting: Speech Pathology

## 2022-09-17 DIAGNOSIS — R41841 Cognitive communication deficit: Secondary | ICD-10-CM

## 2022-09-17 DIAGNOSIS — R471 Dysarthria and anarthria: Secondary | ICD-10-CM

## 2022-09-17 NOTE — Therapy (Signed)
OUTPATIENT SPEECH LANGUAGE PATHOLOGY TREATMENT  Patient Name: [name] MRN: [id number] DOB:[birth year], 77 y.o., female Today's Date: 09/12/2022   PCP: Lynnea Ferrier, MD  REFERRING PROVIDER: Vida Rigger, MD    End of Session - 09/14/22 1103     Visit Number 11    Number of Visits 25    Date for SLP Re-Evaluation 10/21/22    Progress Note Due on Visit 10    SLP Start Time 1530    SLP Stop Time  1605    SLP Time Calculation (min) 35 min    Activity Tolerance Patient tolerated treatment well             No past medical history on file.  Patient Active Problem List   Diagnosis Date Noted   Meningioma (HCC) 09/15/2020   Chronic diastolic CHF (congestive heart failure) (HCC) 09/15/2020   Memory difficulty 08/31/2020   History of stroke 08/31/2020   Falls frequently 08/31/2020   Ataxic dysarthria 08/31/2020   Acute CHF (congestive heart failure) (HCC) 07/15/2020   Hypertension    Anterior epistaxis    Senile purpura (HCC) 06/15/2020   Ganglion of joint 06/12/2020   Hyperlipidemia 06/12/2020   Hypothyroidism due to acquired atrophy of thyroid 06/12/2020   Recurrent major depressive disorder, in partial remission (HCC) 06/12/2020   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/12/2020   Sleep apnea 06/12/2020   Elevated troponin 02/06/2020   Chronic respiratory failure with hypoxia (HCC) 12/22/2018   Immunosuppression (HCC) 12/22/2018   Pulmonary fibrosis (HCC) 12/21/2018   Bilateral carotid artery disease (HCC) 07/31/2016   Acute CVA (cerebrovascular accident) (HCC) 07/17/2016   Other abnormal glucose 11/25/2013   Prediabetes 11/25/2013   Rheumatoid arthritis with rheumatoid factor (HCC) 09/20/2013    ONSET DATE: referral date 07/15/22; patient reports gradual onset over past several years   REFERRING DIAG: dysphonia  THERAPY DIAG:  Dysarthria and anarthria  Cognitive communication deficit  Rationale for Evaluation and Treatment Rehabilitation  SUBJECTIVE:    Pt accompanied by: self  PERTINENT HISTORY: [name] is a 77 y.o. female with PMH including rheumatoid arthritis, hyperlipidemia, meningioma, OSA, CAD, memory changes, CVA, Acute on chronic hypoxemic respiratory failure, emphysema and pulmonary fibrosis, lingual swelling, CHF, and depression. 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 Patients husband reported speech problem is worse in PM with significant problems communicating. Patient reported changes in her voice quality and word-finding difficulty. Patient has reported speech and short-term memory difficulty, sometimes difficulty articulating and sometimes difficulty word finding. At this time, patients husband reported she is speaking more softly during the last month. He feels like it sounds as if her teeth are closed when she is trying to talk.  Patient had an MRI in June of 2022 with the following impressions: No acute or subacute infarct. Moderate chronic small vessel ischemia without detected progression and 6 mm left parafalcine calcified meningioma with mild growth from 2018 but no brain contact.   DIAGNOSTIC FINDINGS: MRI 07/22/22 IMPRESSION: 1. No acute intracranial abnormality. Chronic small ischemia in the brain with mild progression since a 2022 MRI. 2. A subcentimeter left para-falcine dural calcification or less likely meningioma appears stable and inconsequential.  PAIN:  Are you having pain? No   PATIENT GOALS      "I've got to get better."  SUBJECTIVE STATEMENT: Pt becomes SOB when talking at the connected sentence length today.   OBJECTIVE:   TODAY'S TREATMENT:  Skilled treatment session focused on pt's  speech goals. Pt required moderate cues to produce burst of air when using EMST device set at 15cmH2O. Advanced to 35 cmH2O to achieve a self-reported effort level of 8 out of 10. Pt able to produce 3 sets of 10 with moderate to minimal cues for quick  bursts and slow rate.   With cues prior to initiating conversation for loud speech, pt able to achieve 72dB of loudness in 3 minutes of conversation with average 140Hz  pitch.     PATIENT EDUCATION: Education details: EMST Person educated: Patient Education method: Explanation Education comprehension: needs further education   HOME EXERCISE PROGRAM: Provided at a previous session; continue  GOALS: Goals reviewed with patient? Yes  SHORT TERM GOALS: Target date: 10 sessions  Patient will demo HEP for dysarthria accurately with min cues. Baseline: 09/03/22: min-mod cues, variable session-to-session Goal status: IN PROGRESS  2.  Patient will maintain adequate vocal quality and intensity (>70dB) in sentence level responses 90% accuracy. Baseline: 09/03/22: 72 dB, remains variable Goal status: IN PROGRESS  3.  Patient will use dysarthria compensations (slow, loud, overpronounce, pause) in 2-3 sentence responses 90% accuracy with moderate cues. Baseline: 09/03/22 max cues Goal status: IN PROGRESS  4.   To determine optimal resistance levels for Respiratory Muscle Training (RMT) for improving increase hyolaryngeal elevation and strengthen cough for airway clearance, patient will participate in evaluation (and re-assessment as needed) of maximum expiratory pressure (MEP) and maximum inspiratory pressure (MIP) at next therapy session.   Goal status: INITIAL  LONG TERM GOALS: Target date: 10/21/22  Patient will demo HEP for dysarthria independently. Baseline:  Goal status: IN PROGRESS  2.   Patient will maintain intensity (avg >70dB) in 5 minutes conversation with rare min cues. Baseline:  Goal status: IN PROGRESS  3.   Patient will use dysarthria compensations (slow, loud, overpronounce, pause) for intelligibility >90% in 5-8 minutes conversation. Baseline:  Goal status: IN PROGRESS  4.  Patient will participate in MBSS for objective assessment of swallowing function Baseline:  Goal  status: MET   ASSESSMENT:  CLINICAL IMPRESSION: Patient is a 77 y.o. female who presents today for speech therapy d/t hypokinetic dysarthria, characterized by low vocal intensity, low pitch, monopitch, hypoarticulation, mild hypernasality, short rushes of speech due to reduced breath support, and hoarse vocal quality. Her caregiver reports that she frequently reminds pt to speak louder and pt is able to do when cued except when pt doesn't feel good.    OBJECTIVE IMPAIRMENTS include memory, expressive language, dysarthria, and dysphagia. These impairments are limiting patient from effectively communicating at home and in community and safety when swallowing. Factors affecting potential to achieve goals and functional outcome are severity of impairments. Patient will benefit from skilled SLP services to address above impairments and improve overall function.  REHAB POTENTIAL: Good  PLAN: SLP FREQUENCY: 1-2x/week  SLP DURATION: 12 weeks  PLANNED INTERVENTIONS: Aspiration precaution training, Pharyngeal strengthening exercises, Diet toleration management , Language facilitation, Trials of upgraded texture/liquids, Cueing hierachy, Internal/external aids, Functional tasks, Multimodal communication approach, SLP instruction and feedback, Compensatory strategies, Patient/family education, and Re-evaluation   Tonyetta Berko B. Dreama Saa, M.S., CCC-SLP, Tree surgeon Certified Brain Injury Specialist [facility]  [facility] Rehabilitation Services Office [phone] Ascom [phone] Fax [phone]

## 2022-09-18 ENCOUNTER — Other Ambulatory Visit: Payer: Self-pay | Admitting: Internal Medicine

## 2022-09-18 ENCOUNTER — Ambulatory Visit: Payer: PPO | Admitting: Speech Pathology

## 2022-09-18 ENCOUNTER — Encounter: Payer: PPO | Admitting: Speech Pathology

## 2022-09-18 DIAGNOSIS — Z1231 Encounter for screening mammogram for malignant neoplasm of breast: Secondary | ICD-10-CM

## 2022-09-18 DIAGNOSIS — R399 Unspecified symptoms and signs involving the genitourinary system: Secondary | ICD-10-CM | POA: Diagnosis not present

## 2022-09-19 ENCOUNTER — Encounter: Payer: PPO | Admitting: Speech Pathology

## 2022-09-23 DIAGNOSIS — H532 Diplopia: Secondary | ICD-10-CM | POA: Diagnosis not present

## 2022-09-23 DIAGNOSIS — R498 Other voice and resonance disorders: Secondary | ICD-10-CM | POA: Diagnosis not present

## 2022-09-23 DIAGNOSIS — G20A1 Parkinson's disease without dyskinesia, without mention of fluctuations: Secondary | ICD-10-CM | POA: Diagnosis not present

## 2022-09-23 DIAGNOSIS — M069 Rheumatoid arthritis, unspecified: Secondary | ICD-10-CM | POA: Diagnosis not present

## 2022-09-24 ENCOUNTER — Ambulatory Visit: Payer: PPO | Admitting: Speech Pathology

## 2022-09-25 ENCOUNTER — Ambulatory Visit: Payer: PPO | Admitting: Speech Pathology

## 2022-09-25 DIAGNOSIS — R471 Dysarthria and anarthria: Secondary | ICD-10-CM

## 2022-09-25 DIAGNOSIS — R41841 Cognitive communication deficit: Secondary | ICD-10-CM

## 2022-09-25 NOTE — Therapy (Signed)
OUTPATIENT SPEECH LANGUAGE PATHOLOGY TREATMENT DISCHARGE SUMMARY    Patient Name: [name] MRN: [id number] DOB:1946/02/24, 77 y.o., female Today's Date: 09/12/2022   PCP: Lynnea Ferrier, MD  REFERRING PROVIDER: Vida Rigger, MD    End of Session - 09/14/22 1103     Visit Number 11    Number of Visits 25    Date for SLP Re-Evaluation 10/21/22    Progress Note Due on Visit 10    SLP Start Time 1530    SLP Stop Time  1605    SLP Time Calculation (min) 35 min    Activity Tolerance Patient tolerated treatment well             No past medical history on file.  Patient Active Problem List   Diagnosis Date Noted   Meningioma (HCC) 09/15/2020   Chronic diastolic CHF (congestive heart failure) (HCC) 09/15/2020   Memory difficulty 08/31/2020   History of stroke 08/31/2020   Falls frequently 08/31/2020   Ataxic dysarthria 08/31/2020   Acute CHF (congestive heart failure) (HCC) 07/15/2020   Hypertension    Anterior epistaxis    Senile purpura (HCC) 06/15/2020   Ganglion of joint 06/12/2020   Hyperlipidemia 06/12/2020   Hypothyroidism due to acquired atrophy of thyroid 06/12/2020   Recurrent major depressive disorder, in partial remission (HCC) 06/12/2020   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/12/2020   Sleep apnea 06/12/2020   Elevated troponin 02/06/2020   Chronic respiratory failure with hypoxia (HCC) 12/22/2018   Immunosuppression (HCC) 12/22/2018   Pulmonary fibrosis (HCC) 12/21/2018   Bilateral carotid artery disease (HCC) 07/31/2016   Acute CVA (cerebrovascular accident) (HCC) 07/17/2016   Other abnormal glucose 11/25/2013   Prediabetes 11/25/2013   Rheumatoid arthritis with rheumatoid factor (HCC) 09/20/2013    ONSET DATE: referral date 07/15/22; patient reports gradual onset over past several years   REFERRING DIAG: dysphonia  THERAPY DIAG:  Dysarthria and anarthria  Cognitive communication deficit  Rationale for Evaluation and Treatment  Rehabilitation  SUBJECTIVE:   Pt accompanied by: self  PERTINENT HISTORY: Lavene Moss is a 77 y.o. female with PMH including rheumatoid arthritis, hyperlipidemia, meningioma, OSA, CAD, memory changes, CVA, Acute on chronic hypoxemic respiratory failure, emphysema and pulmonary fibrosis, lingual swelling, CHF, and depression. 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 Patients husband reported speech problem is worse in PM with significant problems communicating. Patient reported changes in her voice quality and word-finding difficulty. Patient has reported speech and short-term memory difficulty, sometimes difficulty articulating and sometimes difficulty word finding. At this time, patients husband reported she is speaking more softly during the last month. He feels like it sounds as if her teeth are closed when she is trying to talk.  Patient had an MRI in June of 2022 with the following impressions: No acute or subacute infarct. Moderate chronic small vessel ischemia without detected progression and 6 mm left parafalcine calcified meningioma with mild growth from 2018 but no brain contact.   DIAGNOSTIC FINDINGS: MRI 07/22/22 IMPRESSION: 1. No acute intracranial abnormality. Chronic small ischemia in the brain with mild progression since a 2022 MRI. 2. A subcentimeter left para-falcine dural calcification or less likely meningioma appears stable and inconsequential.  PAIN:  Are you having pain? No   PATIENT GOALS      "I've got to get better."  SUBJECTIVE STATEMENT: See below  OBJECTIVE:   TODAY'S TREATMENT:  Skilled treatment session focused on pt's speech goals. Pt with increased independence  using EMT device. She reports decreased practice of EMT d/t difficulty breathing d/t current heat, fatigue and sleeping late.   Pt's speech intelligibility was reduced during today's session. Pt reports that it was difficult to breath  during current heat (high temperature). Pt not able to recall speech intelligibility strategies but with maximal cues, she was able to increase her vocal intensity briefly before becoming SOB. Pt unable to demonstrate over-articulation despite maximal cues and SLP demonstration. Her speech is tangential with repetition of previously stated information. Pt also reports going to neurologist. Per chart review, neurologist has started pt on Parkinson's medicine.     PATIENT EDUCATION: Education details: EMST Person educated: Patient Education method: Explanation Education comprehension: needs further education   HOME EXERCISE PROGRAM: Provided at a previous session; continue  GOALS: Goals reviewed with patient? Yes  SHORT TERM GOALS: Target date: 10 sessions  Patient will demo HEP for dysarthria accurately with min cues. Baseline: 09/03/22: min-mod cues, variable session-to-session Goal status: IN PROGRESS; MET  2.  Patient will maintain adequate vocal quality and intensity (>70dB) in sentence level responses 90% accuracy. Baseline: 09/03/22: 72 dB, remains variable Goal status: IN PROGRESS; not MET as pt becomes SOB  3.  Patient will use dysarthria compensations (slow, loud, overpronounce, pause) in 2-3 sentence responses 90% accuracy with moderate cues. Baseline: 09/03/22 max cues Goal status: IN PROGRESS; MET  4.   To determine optimal resistance levels for Respiratory Muscle Training (RMT) for improving increase hyolaryngeal elevation and strengthen cough for airway clearance, patient will participate in evaluation (and re-assessment as needed) of maximum expiratory pressure (MEP) and maximum inspiratory pressure (MIP) at next therapy session.   Goal status: INITIAL; MET  LONG TERM GOALS: Target date: 10/21/22  Patient will demo HEP for dysarthria independently. Baseline:  Goal status: IN PROGRESS; MET  2.   Patient will maintain intensity (avg >70dB) in 5 minutes conversation with  rare min cues. Baseline:  Goal status: IN PROGRESS; unable d/t pt becoming SOB  3.   Patient will use dysarthria compensations (slow, loud, overpronounce, pause) for intelligibility >90% in 5-8 minutes conversation. Baseline:  Goal status: IN PROGRESS; not met, requires moderate cues  4.  Patient will participate in MBSS for objective assessment of swallowing function Baseline:  Goal status: MET   ASSESSMENT:  CLINICAL IMPRESSION: Pt has made little progress over the course of 12 ST sessions. She continues to be reliant on caregiver reminders for increased vocal intensity. Her speech during this session continues to be unchanged from initial presentation at time of the evaluation c/b low vocal intensity, reduced articulation, low pitch, mono-pitch. Pt's lack of progress is related to multiple factors that ST is unable to impact at this time - lack  caregiver participation in sessions, cognitive deficits in memory, awareness and pulmonary function. She continues to rely on cues to improve vocal intensity but frequently becomes SOB during longer utterances. She has reached her maximal benefit is now considered to be at her new baseline. At this time, all education has been completed and pt is independent with RMST exercises.     PLAN: Discharge from Outpatient ST.   Delonte Musich B. Dreama Saa, M.S., CCC-SLP, Tree surgeon Certified Brain Injury Specialist [facility]  [facility] Office [phone] Ascom [phone] Fax [phone]

## 2022-09-27 ENCOUNTER — Encounter: Payer: PPO | Admitting: Speech Pathology

## 2022-09-30 ENCOUNTER — Encounter: Payer: PPO | Admitting: Speech Pathology

## 2022-10-02 ENCOUNTER — Ambulatory Visit: Payer: PPO | Admitting: Speech Pathology

## 2022-10-08 ENCOUNTER — Ambulatory Visit: Payer: PPO | Admitting: Speech Pathology

## 2022-10-09 ENCOUNTER — Ambulatory Visit: Payer: PPO

## 2022-10-10 ENCOUNTER — Ambulatory Visit: Payer: PPO | Admitting: Speech Pathology

## 2022-10-10 DIAGNOSIS — I5032 Chronic diastolic (congestive) heart failure: Secondary | ICD-10-CM | POA: Diagnosis not present

## 2022-10-10 DIAGNOSIS — R7303 Prediabetes: Secondary | ICD-10-CM | POA: Diagnosis not present

## 2022-10-10 DIAGNOSIS — D849 Immunodeficiency, unspecified: Secondary | ICD-10-CM | POA: Diagnosis not present

## 2022-10-10 DIAGNOSIS — J9611 Chronic respiratory failure with hypoxia: Secondary | ICD-10-CM | POA: Diagnosis not present

## 2022-10-10 DIAGNOSIS — E7849 Other hyperlipidemia: Secondary | ICD-10-CM | POA: Diagnosis not present

## 2022-10-10 DIAGNOSIS — F3341 Major depressive disorder, recurrent, in partial remission: Secondary | ICD-10-CM | POA: Diagnosis not present

## 2022-10-10 DIAGNOSIS — I779 Disorder of arteries and arterioles, unspecified: Secondary | ICD-10-CM | POA: Diagnosis not present

## 2022-10-10 DIAGNOSIS — E034 Atrophy of thyroid (acquired): Secondary | ICD-10-CM | POA: Diagnosis not present

## 2022-10-10 DIAGNOSIS — D692 Other nonthrombocytopenic purpura: Secondary | ICD-10-CM | POA: Diagnosis not present

## 2022-10-10 DIAGNOSIS — J8410 Pulmonary fibrosis, unspecified: Secondary | ICD-10-CM | POA: Diagnosis not present

## 2022-10-10 DIAGNOSIS — E441 Mild protein-calorie malnutrition: Secondary | ICD-10-CM | POA: Diagnosis not present

## 2022-10-10 DIAGNOSIS — M0579 Rheumatoid arthritis with rheumatoid factor of multiple sites without organ or systems involvement: Secondary | ICD-10-CM | POA: Diagnosis not present

## 2022-10-15 ENCOUNTER — Ambulatory Visit: Payer: PPO | Admitting: Speech Pathology

## 2022-10-18 ENCOUNTER — Encounter: Payer: PPO | Admitting: Speech Pathology

## 2022-10-21 ENCOUNTER — Encounter: Payer: PPO | Admitting: Speech Pathology

## 2022-10-21 DIAGNOSIS — J9611 Chronic respiratory failure with hypoxia: Secondary | ICD-10-CM | POA: Diagnosis not present

## 2022-10-21 DIAGNOSIS — G20A1 Parkinson's disease without dyskinesia, without mention of fluctuations: Secondary | ICD-10-CM | POA: Diagnosis not present

## 2022-10-21 DIAGNOSIS — I779 Disorder of arteries and arterioles, unspecified: Secondary | ICD-10-CM | POA: Diagnosis not present

## 2022-10-21 DIAGNOSIS — D849 Immunodeficiency, unspecified: Secondary | ICD-10-CM | POA: Diagnosis not present

## 2022-10-21 DIAGNOSIS — K625 Hemorrhage of anus and rectum: Secondary | ICD-10-CM | POA: Diagnosis not present

## 2022-10-21 DIAGNOSIS — F3341 Major depressive disorder, recurrent, in partial remission: Secondary | ICD-10-CM | POA: Diagnosis not present

## 2022-10-21 DIAGNOSIS — J8410 Pulmonary fibrosis, unspecified: Secondary | ICD-10-CM | POA: Diagnosis not present

## 2022-10-21 DIAGNOSIS — D329 Benign neoplasm of meninges, unspecified: Secondary | ICD-10-CM | POA: Diagnosis not present

## 2022-10-21 DIAGNOSIS — E441 Mild protein-calorie malnutrition: Secondary | ICD-10-CM | POA: Diagnosis not present

## 2022-10-21 DIAGNOSIS — I5032 Chronic diastolic (congestive) heart failure: Secondary | ICD-10-CM | POA: Diagnosis not present

## 2022-10-23 ENCOUNTER — Encounter: Payer: PPO | Admitting: Speech Pathology

## 2022-10-30 ENCOUNTER — Ambulatory Visit: Payer: PPO

## 2022-11-06 DIAGNOSIS — M21072 Valgus deformity, not elsewhere classified, left ankle: Secondary | ICD-10-CM | POA: Diagnosis not present

## 2022-11-06 DIAGNOSIS — M0579 Rheumatoid arthritis with rheumatoid factor of multiple sites without organ or systems involvement: Secondary | ICD-10-CM | POA: Diagnosis not present

## 2022-11-06 DIAGNOSIS — L909 Atrophic disorder of skin, unspecified: Secondary | ICD-10-CM | POA: Diagnosis not present

## 2022-11-06 DIAGNOSIS — M19071 Primary osteoarthritis, right ankle and foot: Secondary | ICD-10-CM | POA: Diagnosis not present

## 2022-11-06 DIAGNOSIS — M216X1 Other acquired deformities of right foot: Secondary | ICD-10-CM | POA: Diagnosis not present

## 2022-11-06 DIAGNOSIS — M2041 Other hammer toe(s) (acquired), right foot: Secondary | ICD-10-CM | POA: Diagnosis not present

## 2022-11-06 DIAGNOSIS — M7741 Metatarsalgia, right foot: Secondary | ICD-10-CM | POA: Diagnosis not present

## 2022-11-06 DIAGNOSIS — Z79899 Other long term (current) drug therapy: Secondary | ICD-10-CM | POA: Diagnosis not present

## 2022-11-06 DIAGNOSIS — M79672 Pain in left foot: Secondary | ICD-10-CM | POA: Diagnosis not present

## 2022-11-06 DIAGNOSIS — M216X2 Other acquired deformities of left foot: Secondary | ICD-10-CM | POA: Diagnosis not present

## 2022-11-06 DIAGNOSIS — Z8739 Personal history of other diseases of the musculoskeletal system and connective tissue: Secondary | ICD-10-CM | POA: Diagnosis not present

## 2022-11-06 DIAGNOSIS — M19072 Primary osteoarthritis, left ankle and foot: Secondary | ICD-10-CM | POA: Diagnosis not present

## 2022-11-06 DIAGNOSIS — L851 Acquired keratosis [keratoderma] palmaris et plantaris: Secondary | ICD-10-CM | POA: Diagnosis not present

## 2022-11-06 DIAGNOSIS — M79671 Pain in right foot: Secondary | ICD-10-CM | POA: Diagnosis not present

## 2022-11-06 DIAGNOSIS — M2042 Other hammer toe(s) (acquired), left foot: Secondary | ICD-10-CM | POA: Diagnosis not present

## 2022-11-06 DIAGNOSIS — M21071 Valgus deformity, not elsewhere classified, right ankle: Secondary | ICD-10-CM | POA: Diagnosis not present

## 2022-11-12 DIAGNOSIS — J479 Bronchiectasis, uncomplicated: Secondary | ICD-10-CM | POA: Diagnosis not present

## 2022-11-20 DIAGNOSIS — R0902 Hypoxemia: Secondary | ICD-10-CM | POA: Diagnosis not present

## 2022-11-20 DIAGNOSIS — Z9981 Dependence on supplemental oxygen: Secondary | ICD-10-CM | POA: Diagnosis not present

## 2022-11-20 DIAGNOSIS — J8410 Pulmonary fibrosis, unspecified: Secondary | ICD-10-CM | POA: Diagnosis not present

## 2022-11-25 DIAGNOSIS — R899 Unspecified abnormal finding in specimens from other organs, systems and tissues: Secondary | ICD-10-CM | POA: Diagnosis not present

## 2022-11-25 DIAGNOSIS — R399 Unspecified symptoms and signs involving the genitourinary system: Secondary | ICD-10-CM | POA: Diagnosis not present

## 2022-11-26 ENCOUNTER — Ambulatory Visit
Admission: RE | Admit: 2022-11-26 | Discharge: 2022-11-26 | Disposition: A | Discharge status: Home / Self Care | Payer: PPO | Source: Ambulatory Visit | Attending: Internal Medicine | Admitting: Internal Medicine

## 2022-11-26 DIAGNOSIS — Z1231 Encounter for screening mammogram for malignant neoplasm of breast: Secondary | ICD-10-CM | POA: Diagnosis not present

## 2022-11-26 DIAGNOSIS — R399 Unspecified symptoms and signs involving the genitourinary system: Secondary | ICD-10-CM | POA: Diagnosis not present

## 2022-11-26 DIAGNOSIS — R899 Unspecified abnormal finding in specimens from other organs, systems and tissues: Secondary | ICD-10-CM | POA: Diagnosis not present

## 2022-11-27 DIAGNOSIS — G20C Parkinsonism, unspecified: Secondary | ICD-10-CM | POA: Diagnosis not present

## 2022-12-10 DIAGNOSIS — Z9981 Dependence on supplemental oxygen: Secondary | ICD-10-CM | POA: Diagnosis not present

## 2022-12-10 DIAGNOSIS — J8410 Pulmonary fibrosis, unspecified: Secondary | ICD-10-CM | POA: Diagnosis not present

## 2022-12-10 DIAGNOSIS — R0902 Hypoxemia: Secondary | ICD-10-CM | POA: Diagnosis not present

## 2022-12-21 DIAGNOSIS — J8410 Pulmonary fibrosis, unspecified: Secondary | ICD-10-CM | POA: Diagnosis not present

## 2022-12-21 DIAGNOSIS — Z9981 Dependence on supplemental oxygen: Secondary | ICD-10-CM | POA: Diagnosis not present

## 2022-12-21 DIAGNOSIS — R0902 Hypoxemia: Secondary | ICD-10-CM | POA: Diagnosis not present

## 2023-01-10 DIAGNOSIS — D849 Immunodeficiency, unspecified: Secondary | ICD-10-CM | POA: Diagnosis not present

## 2023-01-10 DIAGNOSIS — E7849 Other hyperlipidemia: Secondary | ICD-10-CM | POA: Diagnosis not present

## 2023-01-10 DIAGNOSIS — J8410 Pulmonary fibrosis, unspecified: Secondary | ICD-10-CM | POA: Diagnosis not present

## 2023-01-10 DIAGNOSIS — F3341 Major depressive disorder, recurrent, in partial remission: Secondary | ICD-10-CM | POA: Diagnosis not present

## 2023-01-10 DIAGNOSIS — R7303 Prediabetes: Secondary | ICD-10-CM | POA: Diagnosis not present

## 2023-01-10 DIAGNOSIS — E441 Mild protein-calorie malnutrition: Secondary | ICD-10-CM | POA: Diagnosis not present

## 2023-01-10 DIAGNOSIS — D692 Other nonthrombocytopenic purpura: Secondary | ICD-10-CM | POA: Diagnosis not present

## 2023-01-10 DIAGNOSIS — Z23 Encounter for immunization: Secondary | ICD-10-CM | POA: Diagnosis not present

## 2023-01-10 DIAGNOSIS — I5032 Chronic diastolic (congestive) heart failure: Secondary | ICD-10-CM | POA: Diagnosis not present

## 2023-01-10 DIAGNOSIS — I779 Disorder of arteries and arterioles, unspecified: Secondary | ICD-10-CM | POA: Diagnosis not present

## 2023-01-10 DIAGNOSIS — J9611 Chronic respiratory failure with hypoxia: Secondary | ICD-10-CM | POA: Diagnosis not present

## 2023-01-10 DIAGNOSIS — E034 Atrophy of thyroid (acquired): Secondary | ICD-10-CM | POA: Diagnosis not present

## 2023-01-10 DIAGNOSIS — M0579 Rheumatoid arthritis with rheumatoid factor of multiple sites without organ or systems involvement: Secondary | ICD-10-CM | POA: Diagnosis not present

## 2023-01-13 DIAGNOSIS — D849 Immunodeficiency, unspecified: Secondary | ICD-10-CM | POA: Diagnosis not present

## 2023-01-13 DIAGNOSIS — I779 Disorder of arteries and arterioles, unspecified: Secondary | ICD-10-CM | POA: Diagnosis not present

## 2023-01-13 DIAGNOSIS — E7849 Other hyperlipidemia: Secondary | ICD-10-CM | POA: Diagnosis not present

## 2023-01-13 DIAGNOSIS — R7303 Prediabetes: Secondary | ICD-10-CM | POA: Diagnosis not present

## 2023-01-20 DIAGNOSIS — J8410 Pulmonary fibrosis, unspecified: Secondary | ICD-10-CM | POA: Diagnosis not present

## 2023-01-20 DIAGNOSIS — R0902 Hypoxemia: Secondary | ICD-10-CM | POA: Diagnosis not present

## 2023-01-20 DIAGNOSIS — Z9981 Dependence on supplemental oxygen: Secondary | ICD-10-CM | POA: Diagnosis not present

## 2023-01-23 DIAGNOSIS — R300 Dysuria: Secondary | ICD-10-CM | POA: Diagnosis not present

## 2023-01-27 ENCOUNTER — Inpatient Hospital Stay
Admission: EM | Admit: 2023-01-27 | Discharge: 2023-02-05 | DRG: 193 | Disposition: A | Discharge status: Skilled Nursing Facility | Payer: PPO | Attending: Student | Admitting: Student

## 2023-01-27 ENCOUNTER — Emergency Department: Payer: PPO

## 2023-01-27 ENCOUNTER — Other Ambulatory Visit: Payer: Self-pay

## 2023-01-27 DIAGNOSIS — Z885 Allergy status to narcotic agent status: Secondary | ICD-10-CM

## 2023-01-27 DIAGNOSIS — Z7982 Long term (current) use of aspirin: Secondary | ICD-10-CM

## 2023-01-27 DIAGNOSIS — E785 Hyperlipidemia, unspecified: Secondary | ICD-10-CM | POA: Diagnosis not present

## 2023-01-27 DIAGNOSIS — I2489 Other forms of acute ischemic heart disease: Secondary | ICD-10-CM | POA: Diagnosis not present

## 2023-01-27 DIAGNOSIS — M059 Rheumatoid arthritis with rheumatoid factor, unspecified: Secondary | ICD-10-CM | POA: Diagnosis not present

## 2023-01-27 DIAGNOSIS — Z79890 Hormone replacement therapy (postmenopausal): Secondary | ICD-10-CM

## 2023-01-27 DIAGNOSIS — E876 Hypokalemia: Secondary | ICD-10-CM | POA: Diagnosis not present

## 2023-01-27 DIAGNOSIS — J9621 Acute and chronic respiratory failure with hypoxia: Secondary | ICD-10-CM | POA: Diagnosis present

## 2023-01-27 DIAGNOSIS — J441 Chronic obstructive pulmonary disease with (acute) exacerbation: Secondary | ICD-10-CM | POA: Diagnosis not present

## 2023-01-27 DIAGNOSIS — Z825 Family history of asthma and other chronic lower respiratory diseases: Secondary | ICD-10-CM

## 2023-01-27 DIAGNOSIS — G4730 Sleep apnea, unspecified: Secondary | ICD-10-CM | POA: Diagnosis not present

## 2023-01-27 DIAGNOSIS — R000 Tachycardia, unspecified: Secondary | ICD-10-CM | POA: Diagnosis not present

## 2023-01-27 DIAGNOSIS — D849 Immunodeficiency, unspecified: Secondary | ICD-10-CM | POA: Diagnosis not present

## 2023-01-27 DIAGNOSIS — R0902 Hypoxemia: Secondary | ICD-10-CM | POA: Diagnosis not present

## 2023-01-27 DIAGNOSIS — Z9981 Dependence on supplemental oxygen: Secondary | ICD-10-CM

## 2023-01-27 DIAGNOSIS — K219 Gastro-esophageal reflux disease without esophagitis: Secondary | ICD-10-CM | POA: Diagnosis not present

## 2023-01-27 DIAGNOSIS — I517 Cardiomegaly: Secondary | ICD-10-CM | POA: Diagnosis not present

## 2023-01-27 DIAGNOSIS — J440 Chronic obstructive pulmonary disease with acute lower respiratory infection: Secondary | ICD-10-CM | POA: Diagnosis present

## 2023-01-27 DIAGNOSIS — Z90710 Acquired absence of both cervix and uterus: Secondary | ICD-10-CM

## 2023-01-27 DIAGNOSIS — Z79620 Long term (current) use of immunosuppressive biologic: Secondary | ICD-10-CM

## 2023-01-27 DIAGNOSIS — R0602 Shortness of breath: Secondary | ICD-10-CM | POA: Diagnosis not present

## 2023-01-27 DIAGNOSIS — J9811 Atelectasis: Secondary | ICD-10-CM | POA: Diagnosis not present

## 2023-01-27 DIAGNOSIS — I10 Essential (primary) hypertension: Secondary | ICD-10-CM | POA: Diagnosis not present

## 2023-01-27 DIAGNOSIS — R410 Disorientation, unspecified: Secondary | ICD-10-CM | POA: Diagnosis not present

## 2023-01-27 DIAGNOSIS — Z8673 Personal history of transient ischemic attack (TIA), and cerebral infarction without residual deficits: Secondary | ICD-10-CM | POA: Diagnosis not present

## 2023-01-27 DIAGNOSIS — J470 Bronchiectasis with acute lower respiratory infection: Secondary | ICD-10-CM | POA: Diagnosis not present

## 2023-01-27 DIAGNOSIS — F3341 Major depressive disorder, recurrent, in partial remission: Secondary | ICD-10-CM | POA: Diagnosis present

## 2023-01-27 DIAGNOSIS — J8410 Pulmonary fibrosis, unspecified: Secondary | ICD-10-CM | POA: Diagnosis not present

## 2023-01-27 DIAGNOSIS — I509 Heart failure, unspecified: Principal | ICD-10-CM

## 2023-01-27 DIAGNOSIS — J189 Pneumonia, unspecified organism: Principal | ICD-10-CM | POA: Diagnosis present

## 2023-01-27 DIAGNOSIS — I110 Hypertensive heart disease with heart failure: Secondary | ICD-10-CM | POA: Diagnosis present

## 2023-01-27 DIAGNOSIS — Z79899 Other long term (current) drug therapy: Secondary | ICD-10-CM | POA: Diagnosis not present

## 2023-01-27 DIAGNOSIS — R062 Wheezing: Secondary | ICD-10-CM | POA: Diagnosis not present

## 2023-01-27 DIAGNOSIS — J90 Pleural effusion, not elsewhere classified: Secondary | ICD-10-CM | POA: Diagnosis not present

## 2023-01-27 DIAGNOSIS — E034 Atrophy of thyroid (acquired): Secondary | ICD-10-CM | POA: Diagnosis present

## 2023-01-27 DIAGNOSIS — I5033 Acute on chronic diastolic (congestive) heart failure: Secondary | ICD-10-CM | POA: Diagnosis present

## 2023-01-27 DIAGNOSIS — Z7902 Long term (current) use of antithrombotics/antiplatelets: Secondary | ICD-10-CM

## 2023-01-27 DIAGNOSIS — R918 Other nonspecific abnormal finding of lung field: Secondary | ICD-10-CM | POA: Diagnosis not present

## 2023-01-27 DIAGNOSIS — J9601 Acute respiratory failure with hypoxia: Secondary | ICD-10-CM | POA: Diagnosis not present

## 2023-01-27 DIAGNOSIS — M6281 Muscle weakness (generalized): Secondary | ICD-10-CM | POA: Diagnosis not present

## 2023-01-27 DIAGNOSIS — R591 Generalized enlarged lymph nodes: Secondary | ICD-10-CM | POA: Diagnosis not present

## 2023-01-27 DIAGNOSIS — Z803 Family history of malignant neoplasm of breast: Secondary | ICD-10-CM

## 2023-01-27 DIAGNOSIS — R7989 Other specified abnormal findings of blood chemistry: Secondary | ICD-10-CM | POA: Diagnosis present

## 2023-01-27 DIAGNOSIS — Z1152 Encounter for screening for COVID-19: Secondary | ICD-10-CM

## 2023-01-27 DIAGNOSIS — J479 Bronchiectasis, uncomplicated: Secondary | ICD-10-CM | POA: Diagnosis not present

## 2023-01-27 DIAGNOSIS — J439 Emphysema, unspecified: Secondary | ICD-10-CM | POA: Diagnosis present

## 2023-01-27 DIAGNOSIS — J9602 Acute respiratory failure with hypercapnia: Secondary | ICD-10-CM | POA: Diagnosis not present

## 2023-01-27 DIAGNOSIS — I5023 Acute on chronic systolic (congestive) heart failure: Secondary | ICD-10-CM | POA: Diagnosis not present

## 2023-01-27 DIAGNOSIS — Z79631 Long term (current) use of antimetabolite agent: Secondary | ICD-10-CM

## 2023-01-27 DIAGNOSIS — R0989 Other specified symptoms and signs involving the circulatory and respiratory systems: Secondary | ICD-10-CM | POA: Diagnosis not present

## 2023-01-27 LAB — LACTIC ACID, PLASMA: Lactic Acid, Venous: 1.9 mmol/L (ref 0.5–1.9)

## 2023-01-27 LAB — COMPREHENSIVE METABOLIC PANEL
ALT: 16 U/L (ref 0–44)
AST: 24 U/L (ref 15–41)
Albumin: 3.6 g/dL (ref 3.5–5.0)
Alkaline Phosphatase: 76 U/L (ref 38–126)
Anion gap: 12 (ref 5–15)
BUN: 12 mg/dL (ref 8–23)
CO2: 21 mmol/L — ABNORMAL LOW (ref 22–32)
Calcium: 8.5 mg/dL — ABNORMAL LOW (ref 8.9–10.3)
Chloride: 103 mmol/L (ref 98–111)
Creatinine, Ser: 0.93 mg/dL (ref 0.44–1.00)
GFR, Estimated: >60 mL/min (ref >60)
Glucose, Bld: 198 mg/dL — ABNORMAL HIGH (ref 70–99)
Potassium: 3.8 mmol/L (ref 3.5–5.1)
Sodium: 136 mmol/L (ref 135–145)
Total Bilirubin: 1.4 mg/dL — ABNORMAL HIGH (ref 0.3–1.2)
Total Protein: 7.4 g/dL (ref 6.5–8.1)

## 2023-01-27 LAB — CBC WITH DIFFERENTIAL/PLATELET
Abs Immature Granulocytes: 0.08 10*3/uL — ABNORMAL HIGH (ref 0.00–0.07)
Basophils Absolute: 0.1 10*3/uL (ref 0.0–0.1)
Basophils Relative: 1 %
Eosinophils Absolute: 0.2 10*3/uL (ref 0.0–0.5)
Eosinophils Relative: 2 %
HCT: 33.3 % — ABNORMAL LOW (ref 36.0–46.0)
Hemoglobin: 10.8 g/dL — ABNORMAL LOW (ref 12.0–15.0)
Immature Granulocytes: 1 %
Lymphocytes Relative: 25 %
Lymphs Abs: 2.9 10*3/uL (ref 0.7–4.0)
MCH: 34.2 pg — ABNORMAL HIGH (ref 26.0–34.0)
MCHC: 32.4 g/dL (ref 30.0–36.0)
MCV: 105.4 fL — ABNORMAL HIGH (ref 80.0–100.0)
Monocytes Absolute: 1.8 10*3/uL — ABNORMAL HIGH (ref 0.1–1.0)
Monocytes Relative: 16 %
Neutro Abs: 6.6 10*3/uL (ref 1.7–7.7)
Neutrophils Relative %: 55 %
Platelets: 264 10*3/uL (ref 150–400)
RBC: 3.16 MIL/uL — ABNORMAL LOW (ref 3.87–5.11)
RDW: 17.2 % — ABNORMAL HIGH (ref 11.5–15.5)
WBC: 11.6 10*3/uL — ABNORMAL HIGH (ref 4.0–10.5)
nRBC: 0.3 % — ABNORMAL HIGH (ref 0.0–0.2)

## 2023-01-27 LAB — BLOOD GAS, VENOUS
Acid-base deficit: 5 mmol/L — ABNORMAL HIGH (ref 0.0–2.0)
Bicarbonate: 21.6 mmol/L (ref 20.0–28.0)
O2 Saturation: 90.2 %
Patient temperature: 37
pCO2, Ven: 45 mm[Hg] (ref 44–60)
pH, Ven: 7.29 (ref 7.25–7.43)
pO2, Ven: 61 mm[Hg] — ABNORMAL HIGH (ref 32–45)

## 2023-01-27 LAB — TROPONIN I (HIGH SENSITIVITY)
Troponin I (High Sensitivity): 201 ng/L (ref <18)
Troponin I (High Sensitivity): 626 ng/L (ref <18)

## 2023-01-27 LAB — BRAIN NATRIURETIC PEPTIDE: B Natriuretic Peptide: 511.6 pg/mL — ABNORMAL HIGH (ref 0.0–100.0)

## 2023-01-27 MED ORDER — SODIUM CHLORIDE 0.9 % IV SOLN
2.0000 g | INTRAVENOUS | Status: DC
  Administered 2023-01-28 – 2023-01-29 (×2): 2 g via INTRAVENOUS
  Filled 2023-01-27 (×2): qty 20

## 2023-01-27 MED ORDER — CLOPIDOGREL BISULFATE 75 MG PO TABS
75.0000 mg | ORAL_TABLET | Freq: Every day | ORAL | Status: DC
  Administered 2023-01-28 – 2023-02-05 (×9): 75 mg via ORAL
  Filled 2023-01-27 (×9): qty 1

## 2023-01-27 MED ORDER — FOLIC ACID 1 MG PO TABS
1.0000 mg | ORAL_TABLET | Freq: Every day | ORAL | Status: DC
  Administered 2023-01-28 – 2023-02-05 (×9): 1 mg via ORAL
  Filled 2023-01-27 (×9): qty 1

## 2023-01-27 MED ORDER — FUROSEMIDE 10 MG/ML IJ SOLN
40.0000 mg | Freq: Once | INTRAMUSCULAR | Status: AC
  Administered 2023-01-27: 40 mg via INTRAVENOUS
  Filled 2023-01-27: qty 4

## 2023-01-27 MED ORDER — ONDANSETRON HCL 4 MG/2ML IJ SOLN: 4.0000 mg | Freq: Four times a day (QID) | INTRAMUSCULAR | Status: AC | PRN

## 2023-01-27 MED ORDER — LEVOTHYROXINE SODIUM 100 MCG PO TABS
100.0000 ug | ORAL_TABLET | Freq: Every day | ORAL | Status: DC
  Administered 2023-01-28 – 2023-02-05 (×9): 100 ug via ORAL
  Filled 2023-01-27 (×5): qty 1
  Filled 2023-01-27: qty 2
  Filled 2023-01-27 (×4): qty 1

## 2023-01-27 MED ORDER — HEPARIN (PORCINE) 25000 UT/250ML-% IV SOLN
1000.0000 [IU]/h | INTRAVENOUS | Status: DC
  Administered 2023-01-28 – 2023-01-30 (×3): 850 [IU]/h via INTRAVENOUS
  Filled 2023-01-27 (×3): qty 250

## 2023-01-27 MED ORDER — ACETAMINOPHEN 650 MG RE SUPP: 650.0000 mg | Freq: Four times a day (QID) | RECTAL | Status: AC | PRN

## 2023-01-27 MED ORDER — HEPARIN SODIUM (PORCINE) 5000 UNIT/ML IJ SOLN: 5000.0000 [IU] | Freq: Three times a day (TID) | INTRAMUSCULAR | Status: DC

## 2023-01-27 MED ORDER — TORSEMIDE 20 MG PO TABS: 40.0000 mg | ORAL_TABLET | Freq: Every day | ORAL | Status: DC

## 2023-01-27 MED ORDER — IPRATROPIUM-ALBUTEROL 0.5-2.5 (3) MG/3ML IN SOLN
6.0000 mL | Freq: Once | RESPIRATORY_TRACT | Status: AC
  Administered 2023-01-27: 6 mL via RESPIRATORY_TRACT
  Filled 2023-01-27: qty 3

## 2023-01-27 MED ORDER — HYDRALAZINE HCL 20 MG/ML IJ SOLN
5.0000 mg | Freq: Three times a day (TID) | INTRAMUSCULAR | Status: AC | PRN
Start: 2023-01-27 — End: 2023-01-31

## 2023-01-27 MED ORDER — LAMOTRIGINE 100 MG PO TABS
100.0000 mg | ORAL_TABLET | Freq: Every day | ORAL | Status: DC
  Administered 2023-01-28 – 2023-02-05 (×9): 100 mg via ORAL
  Filled 2023-01-27 (×9): qty 1

## 2023-01-27 MED ORDER — IPRATROPIUM-ALBUTEROL 0.5-2.5 (3) MG/3ML IN SOLN
3.0000 mL | Freq: Four times a day (QID) | RESPIRATORY_TRACT | Status: AC
  Administered 2023-01-28 (×3): 3 mL via RESPIRATORY_TRACT
  Filled 2023-01-27 (×3): qty 3

## 2023-01-27 MED ORDER — ASPIRIN 81 MG PO TBEC
81.0000 mg | DELAYED_RELEASE_TABLET | Freq: Every day | ORAL | Status: DC
  Administered 2023-01-28 – 2023-02-05 (×9): 81 mg via ORAL
  Filled 2023-01-27 (×9): qty 1

## 2023-01-27 MED ORDER — SENNOSIDES-DOCUSATE SODIUM 8.6-50 MG PO TABS
1.0000 | ORAL_TABLET | Freq: Every evening | ORAL | Status: DC | PRN
Start: 2023-01-27 — End: 2023-02-05
  Administered 2023-02-01: 1 via ORAL
  Filled 2023-01-27: qty 1

## 2023-01-27 MED ORDER — VENLAFAXINE HCL ER 75 MG PO CP24
150.0000 mg | ORAL_CAPSULE | Freq: Every day | ORAL | Status: DC
  Administered 2023-01-28 – 2023-02-05 (×9): 150 mg via ORAL
  Filled 2023-01-27 (×4): qty 2
  Filled 2023-01-27: qty 1
  Filled 2023-01-27 (×4): qty 2

## 2023-01-27 MED ORDER — ACETAMINOPHEN 325 MG PO TABS
650.0000 mg | ORAL_TABLET | Freq: Four times a day (QID) | ORAL | Status: AC | PRN
  Administered 2023-01-28 – 2023-02-02 (×8): 650 mg via ORAL
  Filled 2023-01-27 (×8): qty 2

## 2023-01-27 MED ORDER — CLONAZEPAM 0.5 MG PO TABS: 0.5000 mg | ORAL_TABLET | Freq: Two times a day (BID) | ORAL | Status: DC | PRN

## 2023-01-27 MED ORDER — SODIUM CHLORIDE 0.9 % IV SOLN
2.0000 g | INTRAVENOUS | Status: DC
  Administered 2023-01-27: 2 g via INTRAVENOUS
  Filled 2023-01-27: qty 20

## 2023-01-27 MED ORDER — MIRTAZAPINE 15 MG PO TABS: 15.0000 mg | ORAL_TABLET | Freq: Every day | ORAL | Status: DC

## 2023-01-27 MED ORDER — SODIUM CHLORIDE 0.9 % IV SOLN
500.0000 mg | INTRAVENOUS | Status: DC
  Administered 2023-01-27: 500 mg via INTRAVENOUS
  Filled 2023-01-27: qty 5

## 2023-01-27 MED ORDER — ASPIRIN 325 MG PO TABS
325.0000 mg | ORAL_TABLET | Freq: Once | ORAL | Status: AC
Start: 2023-01-27 — End: 2023-01-27
  Administered 2023-01-27: 325 mg via ORAL
  Filled 2023-01-27: qty 1

## 2023-01-27 MED ORDER — ONDANSETRON HCL 4 MG PO TABS: 4.0000 mg | ORAL_TABLET | Freq: Four times a day (QID) | ORAL | Status: AC | PRN

## 2023-01-27 MED ORDER — SODIUM CHLORIDE 0.9 % IV SOLN
500.0000 mg | INTRAVENOUS | Status: DC
  Administered 2023-01-28 – 2023-01-29 (×2): 500 mg via INTRAVENOUS
  Filled 2023-01-27 (×2): qty 5

## 2023-01-27 MED ORDER — HEPARIN BOLUS VIA INFUSION
4000.0000 [IU] | Freq: Once | INTRAVENOUS | Status: AC
  Administered 2023-01-28: 4000 [IU] via INTRAVENOUS
  Filled 2023-01-27: qty 4000

## 2023-01-27 MED ORDER — ATORVASTATIN CALCIUM 20 MG PO TABS
40.0000 mg | ORAL_TABLET | Freq: Every day | ORAL | Status: DC
  Administered 2023-01-28 – 2023-02-04 (×8): 40 mg via ORAL
  Filled 2023-01-27 (×8): qty 2

## 2023-01-27 MED ORDER — PANTOPRAZOLE SODIUM 40 MG PO TBEC
40.0000 mg | DELAYED_RELEASE_TABLET | Freq: Every day | ORAL | Status: DC
  Administered 2023-01-28 – 2023-02-05 (×9): 40 mg via ORAL
  Filled 2023-01-27 (×9): qty 1

## 2023-01-27 NOTE — Assessment & Plan Note (Signed)
-   Atorvastatin 40 mg daily resumed 

## 2023-01-27 NOTE — Assessment & Plan Note (Signed)
Patient is status post furosemide 40 mg IV one-time dose in the ED Home torsemide 40 mg daily resumed for 10/29 Hydralazine 5 mg IV every 8 hours as needed for SBP greater 175, 4 days ordered

## 2023-01-27 NOTE — ED Provider Notes (Signed)
[facility] Provider Note    Event Date/Time   First MD Initiated Contact with Patient 01/27/23 1910     (approximate)   History   Respiratory Distress   HPI  [name] is a 77 year old female with history of COPD on 2 L home O2, CHF presenting to the emergency department for evaluation of shortness of breath.  Patient recently attended her granddaughter's wedding and was more active than usual.  Today, was noted to have shortness of breath throughout the day, worsening leading facility to call EMS.  With EMS initial sats 88% on 3 L, received 2 DuoNeb treatments, 2 albuterol, given Solu-Medrol.  Had an episode of spitting up for which he was given Zofran.  Tachycardic prior to presentation.       Physical Exam   Triage Vital Signs: ED Triage Vitals  Encounter Vitals Group     BP 01/27/23 1906 (!) 179/124     Systolic BP Percentile --      Diastolic BP Percentile --      Pulse Rate 01/27/23 1906 (!) 131     Resp 01/27/23 1906 (!) 33     Temp 01/27/23 1906 98 F (36.7 C)     Temp Source 01/27/23 1906 Axillary     SpO2 01/27/23 1905 99 %     Weight 01/27/23 1911 145 lb 11.2 oz (66.1 kg)     Height 01/27/23 1911 5\' 7"  (1.702 m)     Head Circumference --      Peak Flow --      Pain Score --      Pain Loc --      Pain Education --      Exclude from Growth Chart --     Most recent vital signs: Vitals:   01/27/23 2130 01/27/23 2200  BP: 114/84 116/65  Pulse: (!) 112 (!) 107  Resp: (!) 25 (!) 32  Temp:    SpO2: 91% 95%     General: Awake, interactive, appears uncomfortable CV:  Tachycardic with regular rhythm, good peripheral perfusion  Resp:  Decreased air movement with expiratory wheezing more notable over the right side, coarse lung sounds, tachypnea with respiratory rates in the 40s, retractions present Abd:  Soft, nondistended  Neuro:  Symmetric facial movement, fluid speech   ED Results / Procedures / Treatments   Labs (all  labs ordered are listed, but only abnormal results are displayed) Labs Reviewed  CBC WITH DIFFERENTIAL/PLATELET - Abnormal; Notable for the following components:      Result Value   WBC 11.6 (*)    RBC 3.16 (*)    Hemoglobin 10.8 (*)    HCT 33.3 (*)    MCV 105.4 (*)    MCH 34.2 (*)    RDW 17.2 (*)    nRBC 0.3 (*)    Monocytes Absolute 1.8 (*)    Abs Immature Granulocytes 0.08 (*)    All other components within normal limits  COMPREHENSIVE METABOLIC PANEL - Abnormal; Notable for the following components:   CO2 21 (*)    Glucose, Bld 198 (*)    Calcium 8.5 (*)    Total Bilirubin 1.4 (*)    All other components within normal limits  BRAIN NATRIURETIC PEPTIDE - Abnormal; Notable for the following components:   B Natriuretic Peptide 511.6 (*)    All other components within normal limits  BLOOD GAS, VENOUS - Abnormal; Notable for the following components:   pO2, Ven 61 (*)  Acid-base deficit 5.0 (*)    All other components within normal limits  TROPONIN I (HIGH SENSITIVITY) - Abnormal; Notable for the following components:   Troponin I (High Sensitivity) 201 (*)    All other components within normal limits  TROPONIN I (HIGH SENSITIVITY) - Abnormal; Notable for the following components:   Troponin I (High Sensitivity) 626 (*)    All other components within normal limits  CULTURE, BLOOD (ROUTINE X 2)  CULTURE, BLOOD (ROUTINE X 2)  RESP PANEL BY RT-PCR (RSV, FLU A&B, COVID)  RVPGX2  LACTIC ACID, PLASMA  LACTIC ACID, PLASMA  BASIC METABOLIC PANEL  CBC  PROCALCITONIN  PROCALCITONIN  MAGNESIUM  APTT  PROTIME-INR     EKG EKG independently reviewed interpreted by myself (ER attending) demonstrates:  Initial EKG at 1929 demonstrates sinus tachycardia at a rate of 122, PR 127, QRS 124, LVH noted.  Some ST changes noted with computer interpreting as an acute MI, but appears more consistent with rate related changes on my review, no chest pain, did not feel this met criteria for  activation Repeat EKG obtained at 2211 demonstrate sinus tachycardia at a rate of 104, PR 147, QRS 117, QTc 488, ST changes improved  RADIOLOGY Imaging independently reviewed and interpreted by myself demonstrates:  CXR without obvious focal consolidation, but concerns for volume overload on my review  PROCEDURES:  Critical Care performed: Yes, see critical care procedure note(s)  CRITICAL CARE Performed by: Trinna Post   Total critical care time: 45 minutes  Critical care time was exclusive of separately billable procedures and treating other patients.  Critical care was necessary to treat or prevent imminent or life-threatening deterioration.  Critical care was time spent personally by me on the following activities: development of treatment plan with patient and/or surrogate as well as nursing, discussions with consultants, evaluation of patient's response to treatment, examination of patient, obtaining history from patient or surrogate, ordering and performing treatments and interventions, ordering and review of laboratory studies, ordering and review of radiographic studies, pulse oximetry and re-evaluation of patient's condition.   Procedures   MEDICATIONS ORDERED IN ED: Medications  cefTRIAXone (ROCEPHIN) 2 g in sodium chloride 0.9 % 100 mL IVPB (2 g Intravenous New Bag/Given 01/27/23 2303)  azithromycin (ZITHROMAX) 500 mg in sodium chloride 0.9 % 250 mL IVPB (500 mg Intravenous New Bag/Given 01/27/23 2305)  acetaminophen (TYLENOL) tablet 650 mg (has no administration in time range)    Or  acetaminophen (TYLENOL) suppository 650 mg (has no administration in time range)  ondansetron (ZOFRAN) tablet 4 mg (has no administration in time range)    Or  ondansetron (ZOFRAN) injection 4 mg (has no administration in time range)  senna-docusate (Senokot-S) tablet 1 tablet (has no administration in time range)  ipratropium-albuterol (DUONEB) 0.5-2.5 (3) MG/3ML nebulizer solution 3 mL  (has no administration in time range)  heparin bolus via infusion 4,000 Units (has no administration in time range)  heparin ADULT infusion 100 units/mL (25000 units/249mL) (has no administration in time range)  aspirin EC tablet 81 mg (has no administration in time range)  atorvastatin (LIPITOR) tablet 40 mg (has no administration in time range)  levothyroxine (SYNTHROID) tablet 100 mcg (has no administration in time range)  clopidogrel (PLAVIX) tablet 75 mg (has no administration in time range)  folic acid (FOLVITE) tablet 1 mg (has no administration in time range)  pantoprazole (PROTONIX) EC tablet 40 mg (has no administration in time range)  lamoTRIgine (LAMICTAL) tablet 100 mg (has no administration in  time range)  venlafaxine XR (EFFEXOR-XR) 24 hr capsule 150 mg (has no administration in time range)  hydrALAZINE (APRESOLINE) injection 5 mg (has no administration in time range)  cefTRIAXone (ROCEPHIN) 2 g in sodium chloride 0.9 % 100 mL IVPB (has no administration in time range)  azithromycin (ZITHROMAX) 500 mg in sodium chloride 0.9 % 250 mL IVPB (has no administration in time range)  ipratropium-albuterol (DUONEB) 0.5-2.5 (3) MG/3ML nebulizer solution 6 mL (6 mLs Nebulization Given 01/27/23 2103)  furosemide (LASIX) injection 40 mg (40 mg Intravenous Given 01/27/23 2122)  aspirin tablet 325 mg (325 mg Oral Given 01/27/23 2124)     IMPRESSION / MDM / ASSESSMENT AND PLAN / ED COURSE  I reviewed the triage vital signs and the nursing notes.  Differential diagnosis includes, but is not limited to, COPD exacerbation, CHF exacerbation, hypertensive emergency with flash pulmonary edema, viral illness, pneumonia  Patient's presentation is most consistent with acute presentation with potential threat to life or bodily function.  77 year old female with history of COPD and CHF presenting to the emergency department in respiratory distress.  Arrives with O2 via nasal cannula and breathing  treatment ongoing.  Sats in the upper 90s, but tachypneic with labored respirations.  Will obtain x-Elo Marmolejos, trial BiPAP with inline nebs.  Patient did have improvement with her work of breathing on BiPAP.  VBG with borderline acidosis at 7.29 but pCO2 of 45 with bicarb of 21.  BNP elevated at 511.  CBC with mild leukocytosis at 11.6, stable anemia.  CMP without critical derangements. Initial troponin elevated at 200, increased to 626 on repeat.  Patient denies chest pain currently, did report significant improvement in her breathing on BiPAP, was able to be weaned off and remained with improved breathing.  She remains tachycardic and tachypneic however.  Formal chest x-[place] Passow interpretation pending, no obvious focal consolidation though I do see diffuse opacities that I suspect may be related to edema.  With elevated white blood cell count, we will go ahead and initiate sepsis orders with empiric antibiotics, but hold off on fluids given concerns for total volume overload and no evidence of septic shock.  Given 40 mg of IV Lasix.  Will reach out to hospitalist team for admission.  Discussed with hospitalist team.  They will evaluate the patient for anticipated admission.    FINAL CLINICAL IMPRESSION(S) / ED DIAGNOSES   Final diagnoses:  Acute on chronic congestive heart failure, unspecified heart failure type (HCC)  COPD exacerbation (HCC)     Rx / DC Orders   ED Discharge Orders     None        Note:  This document was prepared using Dragon voice recognition software and may include unintentional dictation errors.   Trinna Post, MD 01/28/23 0000

## 2023-01-27 NOTE — Assessment & Plan Note (Signed)
Etiology workup in progress, suspect secondary demand ischemia in setting of pulmonary edema with hypoxia initially requiring BiPAP therapy Will check high sensitive troponin to ensure downtrending Heparin per pharmacy ordered

## 2023-01-27 NOTE — Progress Notes (Signed)
CODE SEPSIS - PHARMACY COMMUNICATION  **Broad Spectrum Antibiotics should be administered within 1 hour of Sepsis diagnosis**  Time Code Sepsis Called/Page Received: 2236  Antibiotics Ordered: Azithromycin & Ceftriaxone  Time of 1st antibiotic administration: 2303  Otelia Sergeant, PharmD, [facility] 01/27/2023 10:57 PM

## 2023-01-27 NOTE — Hospital Course (Signed)
[name] is a 77 year old female with history of hypothyroid, anxiety, hyperlipidemia, hypertension, GERD, depression, history of heart failure with preserved ejection fraction, chronic respiratory failure on 2 L nasal cannula at baseline, rheumatoid arthritis, memory difficulty, history of stroke, who presents emergency department for chief concerns of shortness of breath.  Vitals in the ED showed temperature of 98, respiration rate of 32, heart rate of 112, blood pressure 121/73, SpO2 of 95% on 10 L high flow nasal cannula and patient was transitioned to BiPAP.  Serum sodium is 138, potassium 3.8, chloride 103, bicarb 21, BUN of 12, serum creatinine 0.92, EGFR greater than 60, nonfasting blood glucose 198, WBC 11.6, hemoglobin 10.8, platelets of 264.  BNP was elevated at 511.6.  High sensitive troponin was 201 and on repeat was 626.  Portable chest x-ray 1 view has been ordered and pending radiology read.  ED treatment: Aspirin 325 mg p.o. one-time dose, DuoNebs one-time treatment, furosemide 40 mg IV one-time dose, azithromycin 500 mg IV, ceftriaxone 2 g IV.

## 2023-01-27 NOTE — ED Triage Notes (Signed)
Pt arrives from home via [facility] with c/o shortness of breath. Initial O2 sats 88% on 3 lpm, 2 duo neb, 2 albuterol, solumedrol, zofran. Copious amounts of clear secretions noted, pt not moving much air, pt vomited once with EMS. Sinus tach noted per EMS. Pt A&O x2, normal per family and home health staff. SOB x1 day.

## 2023-01-27 NOTE — Assessment & Plan Note (Signed)
Home venlafaxine 150 mg daily, mirtazapine 15 mg nightly were resumed

## 2023-01-27 NOTE — Progress Notes (Signed)
ANTICOAGULATION CONSULT NOTE  Pharmacy Consult for heparin infusion Indication: ACS/STEMI  Allergies  Allergen Reactions   Codeine Nausea And Vomiting and Other (See Comments)   Demerol [Meperidine] Nausea And Vomiting    Patient Measurements: Height: 5\' 7"  (170.2 cm) Weight: 66.1 kg (145 lb 11.2 oz) IBW/kg (Calculated) : 61.6 Heparin Dosing Weight: 66.1 kg  Vital Signs: Temp: 98 F (36.7 C) (10/28 1906) Temp Source: Axillary (10/28 1906) BP: 116/65 (10/28 2200) Pulse Rate: 107 (10/28 2200)  Labs: Recent Labs    01/27/23 1915 01/27/23 2122  HGB 10.8*  --   HCT 33.3*  --   PLT 264  --   CREATININE 0.93  --   TROPONINIHS 201* 626*    Estimated Creatinine Clearance: 48.5 mL/min (by C-G formula based on SCr of 0.93 mg/dL).   Medical History: Past Medical History:  Diagnosis Date   Collagen vascular disease (HCC)    Depression    Hyperlipidemia    Muscle pain    Neuropathy    RA (rheumatoid arthritis) (HCC)    Thyroid disease    Vitamin B 12 deficiency     Assessment: Pt is a 77 yo female presenting to ED c/o SOB and O2 sats 88% (code sepsis called), found with elevated troponin I level, trending up.  Goal of Therapy:  Heparin level 0.3-0.7 units/ml Monitor platelets by anticoagulation protocol: Yes   Plan:  Bolus 4000 units x 1 Start heparin infusion at 850 units/hr Will check HL in 8 hr after start of infusion CBC daily while on heparin  Otelia Sergeant, PharmD, [facility] 01/27/2023 11:01 PM

## 2023-01-27 NOTE — Assessment & Plan Note (Addendum)
Check magnesium and procalcitonin on admission Check procalcitonin in the a.m. Status post ceftriaxone 2 g IV one-time dose and azithromycin 500 mg IV one-time dose Continue oxygen supplementation to maintain SpO2 greater than 92% DuoNebs, 3 doses ordered on admission

## 2023-01-27 NOTE — Sepsis Progress Note (Signed)
Elink monitoring for the code sepsis protocol.  

## 2023-01-27 NOTE — Assessment & Plan Note (Signed)
Levothyroxine 112 mcg daily before breakfast resumed

## 2023-01-27 NOTE — Assessment & Plan Note (Signed)
On methotrexate weekly AM team to resume when appropriate for inpatient hospitalization

## 2023-01-27 NOTE — Assessment & Plan Note (Signed)
Patient was trialed on CPAP, patient did not tolerate intermittent her CPAP in 2014

## 2023-01-27 NOTE — H&P (Addendum)
History and Physical   [name] ZHY:[id number] DOB: 11-12-1945 DOA: 01/27/2023  PCP: Lynnea Ferrier, MD  Outpatient Specialists: Dr. [facility] Patient coming from: Via EMS  I have personally briefly reviewed patient's old medical records in [facility] EMR.  Chief Concern: Shortness of breath  HPI: [name] is a 77 year old female with history of hypothyroid, anxiety, hyperlipidemia, hypertension, GERD, depression, history of heart failure with preserved ejection fraction, chronic respiratory failure on 2 L nasal cannula at baseline, rheumatoid arthritis, memory difficulty, history of stroke, who presents emergency department for chief concerns of shortness of breath.  Vitals in the ED showed temperature of 98, respiration rate of 32, heart rate of 112, blood pressure 121/73, SpO2 of 95% on 10 L high flow nasal cannula and patient was transitioned to BiPAP.  Serum sodium is 138, potassium 3.8, chloride 103, bicarb 21, BUN of 12, serum creatinine 0.92, EGFR greater than 60, nonfasting blood glucose 198, WBC 11.6, hemoglobin 10.8, platelets of 264.  BNP was elevated at 511.6.  High sensitive troponin was 201 and on repeat was 626.  Portable chest x-ray 1 view has been ordered and pending radiology read.  ED treatment: Aspirin 325 mg p.o. one-time dose, DuoNebs one-time treatment, furosemide 40 mg IV one-time dose, azithromycin 500 mg IV, ceftriaxone 2 g IV. ---------------------------------- At bedside, patient was able to tell me her name, age, location, current calendar year.  Her husband was at bedside.  Patient reports that she started feeling short of breath on Friday and this gradually worsened until Sunday.  Patient reports that she has been having productive cough with yellow sputum.  She denies known sick contacts.   She denies fever, chest pain, abdominal pain, dysuria, hematuria, diarrhea at home.  Her husband reports that she did not  come into the hospital on Friday or Saturday because they had a granddaughter wedding.  Husband states she was not that bad during those days until Sunday.  Patient also has a lot of stress lately and she is a Product/process development scientist.  Patient is sleepy at bedside.  Husband endorses that she has not slept well for the past several days due to the shortness of breath.  Social history: She is at home with her husband.  She denies tobacco, EtOH, recreational drug use.  ROS: Constitutional: no weight change, no fever ENT/Mouth: no sore throat, no rhinorrhea Eyes: no eye pain, no vision changes Cardiovascular: no chest pain, + dyspnea,  no edema, no palpitations Respiratory: + cough, + sputum, no wheezing Gastrointestinal: no nausea, no vomiting, no diarrhea, no constipation Genitourinary: no urinary incontinence, no dysuria, no hematuria Musculoskeletal: no arthralgias, no myalgias Skin: no skin lesions, no pruritus, Neuro: + weakness, no loss of consciousness, no syncope Psych: no anxiety, no depression, + decrease appetite Heme/Lymph: no bruising, no bleeding  ED Course: Cussed with the EDP, patient requiring hospitalization for chief concerns of fluid overload and acute hypoxic respiratory failure.  Assessment/Plan  Principal Problem:   Acute hypoxemic respiratory failure (HCC) Active Problems:   Rheumatoid arthritis with rheumatoid factor (HCC)   Elevated troponin   Hyperlipidemia   Hypothyroidism due to acquired atrophy of thyroid   Immunosuppression (HCC)   Pulmonary fibrosis (HCC)   Recurrent major depressive disorder, in partial remission (HCC)   Sleep apnea   Hypertension   History of stroke   Assessment and Plan:  * Acute hypoxemic respiratory failure (HCC) Check magnesium and procalcitonin on admission Check procalcitonin in the a.m.  Status post ceftriaxone 2 g IV one-time dose and azithromycin 500 mg IV one-time dose Continue oxygen supplementation to maintain SpO2 greater than  92% DuoNebs, 3 doses ordered on admission  Hypertension Patient is status post furosemide 40 mg IV one-time dose in the ED Home torsemide 40 mg daily resumed for 10/29 Hydralazine 5 mg IV every 8 hours as needed for SBP greater 175, 4 days ordered  Sleep apnea Patient was trialed on CPAP, patient did not tolerate intermittent her CPAP in 2014  Recurrent major depressive disorder, in partial remission (HCC) Home venlafaxine 150 mg daily, mirtazapine 15 mg nightly were resumed  Hypothyroidism due to acquired atrophy of thyroid Levothyroxine 112 mcg daily before breakfast resumed  Hyperlipidemia Atorvastatin 40 mg daily resumed  Elevated troponin Etiology workup in progress, suspect secondary demand ischemia in setting of pulmonary edema with hypoxia initially requiring BiPAP therapy Will check high sensitive troponin to ensure downtrending Heparin per pharmacy ordered  Rheumatoid arthritis with rheumatoid factor (HCC) On methotrexate weekly AM team to resume when appropriate for inpatient hospitalization  Chart reviewed.   DVT prophylaxis: Heparin per pharmacy Code Status: Full code Diet: Heart healthy Family Communication: Updated husband at bedside with patient's permission Disposition Plan: Pending clinical course Consults called: None at this time Admission status: Telemetry cardiac, inpatient  Past Medical History:  Diagnosis Date   Collagen vascular disease (HCC)    Depression    Hyperlipidemia    Muscle pain    Neuropathy    RA (rheumatoid arthritis) (HCC)    Thyroid disease    Vitamin B 12 deficiency    Past Surgical History:  Procedure Laterality Date   ABDOMINAL HYSTERECTOMY     BREAST BIOPSY Left    2 benign biopsies   ECTOPIC PREGNANCY SURGERY     KNEE SURGERY Right    LAMINECTOMY     leg vein stripping Bilateral    TONSILLECTOMY     Social History:  reports that she has never smoked. She has never been exposed to tobacco smoke. She has never  used smokeless tobacco. No history on file for alcohol use and drug use.  Allergies  Allergen Reactions   Codeine Nausea And Vomiting and Other (See Comments)   Demerol [Meperidine] Nausea And Vomiting   Family History  Problem Relation Age of Onset   COPD Father    Cancer Father    Breast cancer Cousin    Breast cancer Maternal Aunt    Breast cancer Maternal Aunt    Family history: Family history reviewed and not pertinent.  Prior to Admission medications   Medication Sig Start Date End Date Taking? Authorizing Provider  Adalimumab 40 MG/0.8ML PSKT Inject 40 mg into the skin every 14 (fourteen) days.     [provider]  albuterol (VENTOLIN HFA) 108 (90 Base) MCG/ACT inhaler Inhale 1-2 puffs into the lungs every 6 (six) hours as needed for wheezing or shortness of breath.    [provider]  aspirin EC 81 MG tablet Take 81 mg by mouth daily.     [provider]  atorvastatin (LIPITOR) 40 MG tablet Take 1 tablet (40 mg total) by mouth daily at 6 PM. 07/19/16   Sainani, Rolly Pancake, MD  clonazePAM (KLONOPIN) 0.5 MG tablet Take 0.5 mg by mouth 2 (two) times daily as needed for anxiety.    [provider]  clopidogrel (PLAVIX) 75 MG tablet Take 1 tablet (75 mg total) by mouth daily. 07/19/16   Houston Siren, MD  folic acid (FOLVITE) 1 MG tablet Take 1 mg by mouth daily.    [provider]  ipratropium-albuterol (DUONEB) 0.5-2.5 (3) MG/3ML SOLN Inhale 3 mLs into the lungs 2 (two) times daily as needed (wheezing or shortness of breath).    [provider]  lamoTRIgine (LAMICTAL) 100 MG tablet Take 100 mg by mouth daily.    [provider]  levothyroxine (SYNTHROID) 112 MCG tablet Take 1 tablet (112 mcg total) by mouth daily. 07/17/20   Arnetha Courser, MD  methotrexate (RHEUMATREX) 2.5 MG tablet Take 20 mg by mouth every Saturday.    [provider]  mirtazapine (REMERON) 15 MG tablet Take 15 mg by mouth at bedtime.     [provider]  nystatin cream (MYCOSTATIN) Apply 1 Application topically 2 (two) times daily. 02/19/22   [provider]  pantoprazole (PROTONIX) 40 MG tablet Take 40 mg by mouth.    [provider]  potassium chloride (KLOR-CON) 8 MEQ tablet Take 8 mEq by mouth daily.    [provider]  predniSONE (DELTASONE) 5 MG tablet Take 2.5 mg by mouth daily.    [provider]  sulfamethoxazole-trimethoprim (BACTRIM DS) 800-160 MG tablet Take 1 tablet by mouth every 12 (twelve) hours. 05/08/22   Michiel Cowboy A, PA-C  torsemide (DEMADEX) 20 MG tablet Take 2 tablets (40 mg total) by mouth daily. 09/04/20   Debbe Odea, MD  venlafaxine XR (EFFEXOR-XR) 150 MG 24 hr capsule Take 150 mg by mouth daily.    [provider]  Vitamin D, Ergocalciferol, 50000 units CAPS Take 1 capsule by mouth once a week. 04/26/22   [provider]   Physical Exam: Vitals:   01/27/23 2000 01/27/23 2030 01/27/23 2130 01/27/23 2200  BP: 121/73 108/72 114/84 116/65  Pulse: (!) 112 (!) 108 (!) 112 (!) 107  Resp: (!) 32 (!) 36 (!) 25 (!) 32  Temp:      TempSrc:      SpO2: 95% 95% 91% 95%  Weight:      Height:       Constitutional: appears frail, acutely ill, sleepy Eyes: PERRL, lids and conjunctivae normal ENMT: Mucous membranes are moist. Posterior pharynx clear of any exudate or lesions. Age-appropriate dentition. Hearing appropriate Neck: normal, supple, no masses, no thyromegaly Respiratory: Generalized decreased lung sounds bilaterally, no wheezing, no crackles.  Increased normal respiratory effort.  Increased accessory muscle use.  Cardiovascular: Regular rate and rhythm, no murmurs / rubs / gallops. No extremity edema. 2+ pedal pulses. No carotid bruits.  Abdomen: no tenderness, no masses palpated, no hepatosplenomegaly. Bowel sounds positive.  Musculoskeletal: no clubbing / cyanosis. No joint deformity upper and lower extremities. Good ROM, no  contractures, no atrophy. Normal muscle tone.  Skin: no rashes, lesions, ulcers. No induration Neurologic: Sensation intact. Strength 5/5 in all 4.  Psychiatric: Normal judgment and insight. Alert and oriented x 3. Normal mood.   EKG: independently reviewed, showing sinus tachycardia with rate of 104, QTc 488  Chest x-ray on Admission: I personally reviewed and I agree with radiologist reading as below.  DG Chest Port 1 View  Result Date: 01/27/2023 CLINICAL DATA:  Shortness of breath EXAM: PORTABLE CHEST 1 VIEW COMPARISON:  07/14/2020 FINDINGS: Borderline cardiac enlargement. Mild central congestion. Probable small pleural effusions. Interstitial and patchy pulmonary opacity. Aortic atherosclerosis. No pneumothorax IMPRESSION: Borderline cardiac enlargement with mild central congestion and probable small pleural effusions. Interstitial and patchy pulmonary opacity, possible superimposed infection/pneumonia Electronically Signed   By: Selena Batten  Jake Samples M.D.   On: 01/27/2023 23:02    Labs on Admission: I have personally reviewed following labs  CBC: Recent Labs  Lab 01/27/23 1915  WBC 11.6*  NEUTROABS 6.6  HGB 10.8*  HCT 33.3*  MCV 105.4*  PLT 264   Basic Metabolic Panel: Recent Labs  Lab 01/27/23 1915  NA 136  K 3.8  CL 103  CO2 21*  GLUCOSE 198*  BUN 12  CREATININE 0.93  CALCIUM 8.5*   GFR: Estimated Creatinine Clearance: 48.5 mL/min (by C-G formula based on SCr of 0.93 mg/dL).  Liver Function Tests: Recent Labs  Lab 01/27/23 1915  AST 24  ALT 16  ALKPHOS 76  BILITOT 1.4*  PROT 7.4  ALBUMIN 3.6   Urine analysis:    Component Value Date/Time   COLORURINE YELLOW 05/06/2022 0930   APPEARANCEUR CLOUDY (A) 05/06/2022 0930   APPEARANCEUR Cloudy (A) 01/14/2022 1523   LABSPEC 1.020 05/06/2022 0930   PHURINE 5.5 05/06/2022 0930   GLUCOSEU NEGATIVE 05/06/2022 0930   HGBUR TRACE (A) 05/06/2022 0930   BILIRUBINUR NEGATIVE 05/06/2022 0930   BILIRUBINUR Negative  01/14/2022 1523   KETONESUR NEGATIVE 05/06/2022 0930   PROTEINUR 30 (A) 05/06/2022 0930   NITRITE NEGATIVE 05/06/2022 0930   LEUKOCYTESUR NEGATIVE 05/06/2022 0930   CRITICAL CARE Performed by: Dr. Sedalia Muta  Total critical care time: 32 minutes  Critical care time was exclusive of separately billable procedures and treating other patients.  Critical care was necessary to treat or prevent imminent or life-threatening deterioration.  Critical care was time spent personally by me on the following activities: development of treatment plan with patient and/or surrogate as well as nursing, discussions with consultants, evaluation of patient's response to treatment, examination of patient, obtaining history from patient or surrogate, ordering and performing treatments and interventions, ordering and review of laboratory studies, ordering and review of radiographic studies, pulse oximetry and re-evaluation of patient's condition.  This document was prepared using Dragon Voice Recognition software and may include unintentional dictation errors.  Dr. Sedalia Muta [facility]  If 7PM-7AM, please contact overnight-coverage provider If 7AM-7PM, please contact day attending provider [email or web address]  01/27/2023, 11:56 PM

## 2023-01-28 ENCOUNTER — Encounter: Payer: Self-pay | Admitting: Internal Medicine

## 2023-01-28 DIAGNOSIS — J9601 Acute respiratory failure with hypoxia: Secondary | ICD-10-CM | POA: Diagnosis not present

## 2023-01-28 LAB — HEPARIN LEVEL (UNFRACTIONATED)
Heparin Unfractionated: 0.55 [IU]/mL (ref 0.30–0.70)
Heparin Unfractionated: 0.34 [IU]/mL (ref 0.30–0.70)

## 2023-01-28 LAB — CBC
HCT: 29.7 % — ABNORMAL LOW (ref 36.0–46.0)
Hemoglobin: 9.6 g/dL — ABNORMAL LOW (ref 12.0–15.0)
MCH: 33.9 pg (ref 26.0–34.0)
MCHC: 32.3 g/dL (ref 30.0–36.0)
MCV: 104.9 fL — ABNORMAL HIGH (ref 80.0–100.0)
Platelets: 201 10*3/uL (ref 150–400)
RBC: 2.83 MIL/uL — ABNORMAL LOW (ref 3.87–5.11)
RDW: 17.2 % — ABNORMAL HIGH (ref 11.5–15.5)
WBC: 4.9 10*3/uL (ref 4.0–10.5)
nRBC: 0 % (ref 0.0–0.2)

## 2023-01-28 LAB — BASIC METABOLIC PANEL
Anion gap: 8 (ref 5–15)
BUN: 17 mg/dL (ref 8–23)
CO2: 25 mmol/L (ref 22–32)
Calcium: 7.8 mg/dL — ABNORMAL LOW (ref 8.9–10.3)
Chloride: 105 mmol/L (ref 98–111)
Creatinine, Ser: 0.99 mg/dL (ref 0.44–1.00)
GFR, Estimated: 58 mL/min — ABNORMAL LOW (ref >60)
Glucose, Bld: 160 mg/dL — ABNORMAL HIGH (ref 70–99)
Potassium: 3.4 mmol/L — ABNORMAL LOW (ref 3.5–5.1)
Sodium: 138 mmol/L (ref 135–145)

## 2023-01-28 LAB — PROCALCITONIN
Procalcitonin: 5.71 ng/mL
Procalcitonin: 8.05 ng/mL

## 2023-01-28 LAB — RESP PANEL BY RT-PCR (RSV, FLU A&B, COVID)  RVPGX2
Influenza A by PCR: NEGATIVE
Influenza B by PCR: NEGATIVE
Resp Syncytial Virus by PCR: NEGATIVE
SARS Coronavirus 2 by RT PCR: NEGATIVE

## 2023-01-28 LAB — PROTIME-INR
INR: 1.2 (ref 0.8–1.2)
Prothrombin Time: 15.7 s — ABNORMAL HIGH (ref 11.4–15.2)

## 2023-01-28 LAB — MAGNESIUM: Magnesium: 1.8 mg/dL (ref 1.7–2.4)

## 2023-01-28 LAB — TROPONIN I (HIGH SENSITIVITY): Troponin I (High Sensitivity): 690 ng/L (ref <18)

## 2023-01-28 LAB — APTT: aPTT: 30 s (ref 24–36)

## 2023-01-28 LAB — LACTIC ACID, PLASMA: Lactic Acid, Venous: 2 mmol/L (ref 0.5–1.9)

## 2023-01-28 MED ORDER — FUROSEMIDE 10 MG/ML IJ SOLN
40.0000 mg | Freq: Every day | INTRAMUSCULAR | Status: DC
  Administered 2023-01-29: 40 mg via INTRAVENOUS
  Filled 2023-01-28: qty 4

## 2023-01-28 MED ORDER — FUROSEMIDE 10 MG/ML IJ SOLN
40.0000 mg | Freq: Two times a day (BID) | INTRAMUSCULAR | Status: AC
  Administered 2023-01-28 (×2): 40 mg via INTRAVENOUS
  Filled 2023-01-28 (×2): qty 4

## 2023-01-28 NOTE — ED Notes (Signed)
This RN to bedside to medicate pt. Pt. Bed changed out to hospital bed, pt. Repositioned for comfort. Pt's brief is dry, pure wick in place and functional. Pt. Sitting up in bed, eating breakfast without difficulty. Pt's spouse at bedside assisting pt.

## 2023-01-28 NOTE — Progress Notes (Signed)
ANTICOAGULATION CONSULT NOTE  Pharmacy Consult for heparin infusion Indication: ACS/STEMI  Allergies  Allergen Reactions   Codeine Nausea And Vomiting and Other (See Comments)   Demerol [Meperidine] Nausea And Vomiting    Patient Measurements: Height: 5\' 7"  (170.2 cm) Weight: 66.1 kg (145 lb 11.2 oz) IBW/kg (Calculated) : 61.6 Heparin Dosing Weight: 66.1 kg  Vital Signs: Temp: 97.8 F (36.6 C) (10/29 0923) Temp Source: Oral (10/29 0923) BP: 103/56 (10/29 0923) Pulse Rate: 82 (10/29 0923)  Labs: Recent Labs    01/27/23 1915 01/27/23 2122 01/28/23 0025 01/28/23 0500 01/28/23 0844  HGB 10.8*  --   --  9.6*  --   HCT 33.3*  --   --  29.7*  --   PLT 264  --   --  201  --   APTT  --   --  30  --   --   LABPROT  --   --  15.7*  --   --   INR  --   --  1.2  --   --   HEPARINUNFRC  --   --   --   --  0.55  CREATININE 0.93  --   --  0.99  --   TROPONINIHS 201* 626*  --   --  690*    Estimated Creatinine Clearance: 45.5 mL/min (by C-G formula based on SCr of 0.99 mg/dL).   Medical History: Past Medical History:  Diagnosis Date   Collagen vascular disease (HCC)    Depression    Hyperlipidemia    Muscle pain    Neuropathy    RA (rheumatoid arthritis) (HCC)    Thyroid disease    Vitamin B 12 deficiency     Assessment: Pt is a 77 yo female presenting to ED c/o SOB and O2 sats 88% (code sepsis called), found with elevated troponin I level, trending up. ECG is significant prolonged QT interval. Patient is not on chronic anticoagulation PTA per chart review.  Goal of Therapy:  Heparin level 0.3-0.7 units/ml Monitor platelets by anticoagulation protocol: Yes   Plan:  Heparin level is therapeutic x 1 Continue heparin infusion at 850 units/hr Confirmatory level in 8 hours CBC daily while on heparin  Will M. Dareen Piano, PharmD Clinical Pharmacist 01/28/2023 9:34 AM

## 2023-01-28 NOTE — ED Notes (Signed)
This RN captured 12-lead EKG this AM, showed to Dr. Anner Crete, states no immediate action needed.Will notify Dr. Fran Lowes. Pt. Currently sleeping.

## 2023-01-28 NOTE — Progress Notes (Signed)
ANTICOAGULATION CONSULT NOTE  Pharmacy Consult for heparin infusion Indication: ACS/STEMI  Allergies  Allergen Reactions   Codeine Nausea And Vomiting and Other (See Comments)   Demerol [Meperidine] Nausea And Vomiting    Patient Measurements: Height: 5\' 7"  (170.2 cm) Weight: 66.1 kg (145 lb 11.2 oz) IBW/kg (Calculated) : 61.6 Heparin Dosing Weight: 66.1 kg  Vital Signs: Temp: 97.7 F (36.5 C) (10/29 1633) Temp Source: Oral (10/29 1633) BP: 109/51 (10/29 1633) Pulse Rate: 83 (10/29 1633)  Labs: Recent Labs    01/27/23 1915 01/27/23 2122 01/28/23 0025 01/28/23 0500 01/28/23 0844 01/28/23 1712  HGB 10.8*  --   --  9.6*  --   --   HCT 33.3*  --   --  29.7*  --   --   PLT 264  --   --  201  --   --   APTT  --   --  30  --   --   --   LABPROT  --   --  15.7*  --   --   --   INR  --   --  1.2  --   --   --   HEPARINUNFRC  --   --   --   --  0.55 0.34  CREATININE 0.93  --   --  0.99  --   --   TROPONINIHS 201* 626*  --   --  690*  --     Estimated Creatinine Clearance: 45.5 mL/min (by C-G formula based on SCr of 0.99 mg/dL).   Medical History: Past Medical History:  Diagnosis Date   Collagen vascular disease (HCC)    Depression    Hyperlipidemia    Muscle pain    Neuropathy    RA (rheumatoid arthritis) (HCC)    Thyroid disease    Vitamin B 12 deficiency     Assessment: Pt is a 77 yo female presenting to ED c/o SOB and O2 sats 88% (code sepsis called), found with elevated troponin I level, trending up. ECG is significant prolonged QT interval. Patient is not on chronic anticoagulation PTA per chart review.  Goal of Therapy:  Heparin level 0.3-0.7 units/ml Monitor platelets by anticoagulation protocol: Yes  10/29 @ 1712 0.34  Plan:  Heparin level is therapeutic x 2 Continue heparin infusion at 850 units/hr Check Heparin level with morning labs CBC daily while on heparin  Effie Shy, PharmD Pharmacy Resident  01/28/2023 6:16 PM

## 2023-01-28 NOTE — Progress Notes (Signed)
PROGRESS NOTE    Mckenzie MAYORQUIN  VZD:[id number] DOB: 07/24/1945 DOA: 01/27/2023 PCP: Lynnea Ferrier, MD  [id number]  LOS: 1 day   Brief hospital course:   Assessment & Plan: [name] is a 77 year old female with history of hypothyroid, hypertension, heart failure with preserved ejection fraction, chronic respiratory failure on 2 L nasal cannula at baseline, pulm fibrosis, emphysema, bronchiectasis, rheumatoid arthritis, memory difficulty, history of stroke, who presented emergency department for chief concerns of shortness of breath.   Initial O2 sats 88% on 3 lpm, copious amounts of clear secretions noted.  SpO2 of 95% on 10 L high flow nasal cannula and patient was transitioned to BiPAP.  BNP was elevated at 511.6. High sensitive troponin was 201 and on repeat was 626.  Pt was started on IV lasix, ceftriaxone/azithro, and heparin gtt.  * Acute on chronic hypoxemic respiratory failure (HCC) On 2L O2 at baseline --currently on 4L [place].  Has pulm fibrosis, emphysema, bronchiectasis followed by pulm Dr. Karna Christmas.   --tx for CAP and CHF.  Currently no wheezing. --obtain D-dimer, if elevated, consider CTA PE study. --pulm consult with Dr. Karna Christmas.  CAP --Xray showed "Interstitial and patchy pulmonary opacity, possible superimposed infection/pneumonia."  Procal 5.71 on presentation, WBC 11.6.  Pt was started on ceftriaxone and azithro on presentation. Plan: --cont ceftriaxone and azithro  Possible acute on chronic dCHF --respiratory symptoms with BNP elevated at 511.  Received IV lasix 40 mg x3 doses. --cont IV lasix 40 mg daily  Hypertension --BP varied widely --cont IV lasix --IV hydralazine PRN  Sleep apnea Patient was trialed on CPAP, patient did not tolerate intermittent her CPAP in 2014  Recurrent major depressive disorder, in partial remission (HCC) --cont Home venlafaxine 150 mg daily  Hypothyroidism due to acquired atrophy of thyroid --cont Levothyroxine    Hyperlipidemia --cont Atorvastatin 40 mg daily   Elevated troponin --trop 201, 626, 690.  suspect secondary demand ischemia in setting of hypoxia and PNA.  No chest pain --started on heparin on presentation, continue for now.  Rheumatoid arthritis with rheumatoid factor (HCC) On methotrexate weekly   DVT prophylaxis: [id number] gtt Code Status: Full code  Family Communication: husband updated at bedside today Level of care: Telemetry Cardiac Dispo:   The patient is from: home Anticipated d/c is to: to be determined Anticipated d/c date is: >3 days   Subjective and Interval History:  Pt reported dyspnea on exertion, orthopnea, and increased secretion.   Objective: Vitals:   01/28/23 0945 01/28/23 1041 01/28/23 1633 01/28/23 2044  BP:  98/71 (!) 109/51 111/67  Pulse: 90 86 83 78  Resp:    20  Temp:  97.7 F (36.5 C) 97.7 F (36.5 C) 98 F (36.7 C)  TempSrc:   Oral   SpO2: 94% 93% 93% 92%  Weight:      Height:        Intake/Output Summary (Last 24 hours) at 01/28/2023 2151 Last data filed at 01/28/2023 0056 Gross per 24 hour  Intake 350 ml  Output --  Net 350 ml   Filed Weights   01/27/23 1911  Weight: 66.1 kg    Examination:   Constitutional: NAD, AAOx3 HEENT: conjunctivae and lids normal, EOMI CV: No cyanosis.   RESP: increased RR, shallow breaths, lungs clear. Neuro: II - XII grossly intact.   Psych: Normal mood and affect.  Appropriate judgement and reason   Data Reviewed: I have personally reviewed labs and imaging studies  Time spent: 50 minutes  Darlin Priestly, MD [facility] If 7PM-7AM, please contact night-coverage 01/28/2023, 9:51 PM

## 2023-01-29 ENCOUNTER — Inpatient Hospital Stay: Payer: PPO

## 2023-01-29 DIAGNOSIS — J9601 Acute respiratory failure with hypoxia: Secondary | ICD-10-CM | POA: Diagnosis not present

## 2023-01-29 LAB — CBC
HCT: 30.5 % — ABNORMAL LOW (ref 36.0–46.0)
Hemoglobin: 9.7 g/dL — ABNORMAL LOW (ref 12.0–15.0)
MCH: 33.8 pg (ref 26.0–34.0)
MCHC: 31.8 g/dL (ref 30.0–36.0)
MCV: 106.3 fL — ABNORMAL HIGH (ref 80.0–100.0)
Platelets: 248 10*3/uL (ref 150–400)
RBC: 2.87 MIL/uL — ABNORMAL LOW (ref 3.87–5.11)
RDW: 17.3 % — ABNORMAL HIGH (ref 11.5–15.5)
WBC: 6.1 10*3/uL (ref 4.0–10.5)
nRBC: 0 % (ref 0.0–0.2)

## 2023-01-29 LAB — D-DIMER, QUANTITATIVE: D-Dimer, Quant: 1.12 ug{FEU}/mL — ABNORMAL HIGH (ref 0.00–0.50)

## 2023-01-29 LAB — RESPIRATORY PANEL BY PCR

## 2023-01-29 LAB — PROCALCITONIN: Procalcitonin: 8.86 ng/mL

## 2023-01-29 LAB — BASIC METABOLIC PANEL
Anion gap: 7 (ref 5–15)
BUN: 29 mg/dL — ABNORMAL HIGH (ref 8–23)
CO2: 26 mmol/L (ref 22–32)
Calcium: 7.9 mg/dL — ABNORMAL LOW (ref 8.9–10.3)
Chloride: 106 mmol/L (ref 98–111)
Creatinine, Ser: 1.06 mg/dL — ABNORMAL HIGH (ref 0.44–1.00)
GFR, Estimated: 54 mL/min — ABNORMAL LOW (ref >60)
Glucose, Bld: 92 mg/dL (ref 70–99)
Potassium: 3.5 mmol/L (ref 3.5–5.1)
Sodium: 139 mmol/L (ref 135–145)

## 2023-01-29 LAB — MAGNESIUM: Magnesium: 2.2 mg/dL (ref 1.7–2.4)

## 2023-01-29 LAB — HEPARIN LEVEL (UNFRACTIONATED): Heparin Unfractionated: 0.35 [IU]/mL (ref 0.30–0.70)

## 2023-01-29 MED ORDER — IOHEXOL 350 MG/ML SOLN
75.0000 mL | Freq: Once | INTRAVENOUS | Status: AC | PRN
  Administered 2023-01-29: 75 mL via INTRAVENOUS

## 2023-01-29 MED ORDER — ALPRAZOLAM 0.25 MG PO TABS: 0.2500 mg | ORAL_TABLET | Freq: Once | ORAL | Status: DC

## 2023-01-29 MED ORDER — ALPRAZOLAM 0.5 MG PO TABS
0.5000 mg | ORAL_TABLET | Freq: Once | ORAL | Status: AC
  Administered 2023-01-29: 0.5 mg via ORAL
  Filled 2023-01-29: qty 1

## 2023-01-29 MED ORDER — IPRATROPIUM-ALBUTEROL 0.5-2.5 (3) MG/3ML IN SOLN
3.0000 mL | RESPIRATORY_TRACT | Status: DC | PRN
  Administered 2023-01-29 – 2023-02-05 (×6): 3 mL via RESPIRATORY_TRACT
  Filled 2023-01-29 (×7): qty 3

## 2023-01-29 NOTE — Discharge Instructions (Signed)

## 2023-01-29 NOTE — H&P (Signed)
TOC continuing to follow patient's progress throughout discharge planning.  11:00pm Spoke with patient at the bedside regarding housing. She stated she was previously given information for housing. She stated she is active with PT/ OT via [facility].

## 2023-01-29 NOTE — Progress Notes (Signed)
PROGRESS NOTE    [name]  ZOX:[id number] DOB: 09-17-1945 DOA: 01/27/2023 PCP: Lynnea Ferrier, MD   Assessment & Plan:   Principal Problem:   Acute hypoxemic respiratory failure (HCC) Active Problems:   Rheumatoid arthritis with rheumatoid factor (HCC)   Elevated troponin   Hyperlipidemia   Hypothyroidism due to acquired atrophy of thyroid   Immunosuppression (HCC)   Pulmonary fibrosis (HCC)   Recurrent major depressive disorder, in partial remission (HCC)   Sleep apnea   Hypertension   History of stroke  Assessment and Plan: Acute on chronic hypoxic respiratory failure: likely secondary to CAP. Continue on IV rocephin, azithromycin, bronchodilators & encourage incentive spirometry. D-dimer is elevated. CTA chest is ordered. Increased oxygen demand today. CRP, resp panel ordered as per pulmon. Pulmon consulted and recs apprec. Hx of pulmon fibrosis, emphysema & bronchiectasis. Uses 2L [place] at home and currently on 4L [place]    CAP: continue on on IV rocephin, azithromycin, bronchodilators & encourage incentive spirometry   Possible acute on chronic diastolic CHF: s/p IV lasix x 3. Monitor I/Os    Sleep apnea: does not use CPAP   Recurrent major depressive disorder: in partial remission. Continue on home dose of venlafaxine   Hypothyroidism: continue on levothyroxine    HLD: continue on statin    Elevated troponin: likely secondary to demand ischemia. No chest pain.   Rheumatoid arthritis: on methotrexate weekly       DVT prophylaxis: IV heparin  Code Status: full  Family Communication: discussed pt's care w/ pt's family at bedside and answered their questions  Disposition Plan: depends on PT/OT recs  Level of care: Telemetry Cardiac  Status is: Inpatient Remains inpatient appropriate because: severity of illness    Consultants:  Pulmon   Procedures:   Antimicrobials:  Subjective: Pt c/o shortness of breath   Objective: Vitals:   01/29/23 0344  01/29/23 0542 01/29/23 0606 01/29/23 0837  BP: (!) 140/71 139/77  (!) 142/69  Pulse: 74 89  89  Resp: 18 (!) 24  16  Temp: 98 F (36.7 C) 98.1 F (36.7 C)  (!) 97.5 F (36.4 C)  TempSrc:  Oral  Oral  SpO2: 94% 95% 94% 96%  Weight:      Height:        Intake/Output Summary (Last 24 hours) at 01/29/2023 0902 Last data filed at 01/29/2023 0837 Gross per 24 hour  Intake 998.58 ml  Output 1000 ml  Net -1.42 ml   Filed Weights   01/27/23 1911  Weight: 66.1 kg    Examination:  General exam: Appears calm and comfortable  Respiratory system: decreased breath sounds b/l  Cardiovascular system: S1 & S2+. No rubs, gallops or clicks.  Gastrointestinal system: Abdomen is nondistended, soft and nontender.  Normal bowel sounds heard. Central nervous system: Alert and oriented. Moves all extremities  Psychiatry: Judgement and insight appear normal. Flat mood and affect.     Data Reviewed: I have personally reviewed following labs and imaging studies  CBC: Recent Labs  Lab 01/27/23 1915 01/28/23 0500 01/29/23 0535  WBC 11.6* 4.9 6.1  NEUTROABS 6.6  --   --   HGB 10.8* 9.6* 9.7*  HCT 33.3* 29.7* 30.5*  MCV 105.4* 104.9* 106.3*  PLT 264 201 248   Basic Metabolic Panel: Recent Labs  Lab 01/27/23 1915 01/28/23 0025 01/28/23 0500 01/29/23 0535  NA 136  --  138 139  K 3.8  --  3.4* 3.5  CL 103  --  105 106  CO2 21*  --  25 26  GLUCOSE 198*  --  160* 92  BUN 12  --  17 29*  CREATININE 0.93  --  0.99 1.06*  CALCIUM 8.5*  --  7.8* 7.9*  MG  --  1.8  --  2.2   GFR: Estimated Creatinine Clearance: 42.5 mL/min (A) (by C-G formula based on SCr of 1.06 mg/dL (H)). Liver Function Tests: Recent Labs  Lab 01/27/23 1915  AST 24  ALT 16  ALKPHOS 76  BILITOT 1.4*  PROT 7.4  ALBUMIN 3.6   No results for input(s): "LIPASE", "AMYLASE" in the last 168 hours. No results for input(s): "AMMONIA" in the last 168 hours. Coagulation Profile: Recent Labs  Lab 01/28/23 0025  INR  1.2   Cardiac Enzymes: No results for input(s): "CKTOTAL", "CKMB", "CKMBINDEX", "TROPONINI" in the last 168 hours. BNP (last 3 results) No results for input(s): "PROBNP" in the last 8760 hours. HbA1C: No results for input(s): "HGBA1C" in the last 72 hours. CBG: No results for input(s): "GLUCAP" in the last 168 hours. Lipid Profile: No results for input(s): "CHOL", "HDL", "LDLCALC", "TRIG", "CHOLHDL", "LDLDIRECT" in the last 72 hours. Thyroid Function Tests: No results for input(s): "TSH", "T4TOTAL", "FREET4", "T3FREE", "THYROIDAB" in the last 72 hours. Anemia Panel: No results for input(s): "VITAMINB12", "FOLATE", "FERRITIN", "TIBC", "IRON", "RETICCTPCT" in the last 72 hours. Sepsis Labs: Recent Labs  Lab 01/27/23 2258 01/28/23 0025 01/28/23 0031 01/28/23 0500  PROCALCITON  --  5.71  --  8.05  LATICACIDVEN 1.9  --  2.0*  --     Recent Results (from the past 240 hour(s))  Blood Culture (routine x 2)     Status: None (Preliminary result)   Collection Time: 01/27/23  7:22 PM   Specimen: BLOOD  Result Value Ref Range Status   Specimen Description BLOOD RIGHT ASSIST CONTROL  Final   Special Requests   Final    BOTTLES DRAWN AEROBIC AND ANAEROBIC Blood Culture results may not be optimal due to an excessive volume of blood received in culture bottles   Culture   Final    NO GROWTH < 12 HOURS Performed at [facility], [address] [place], [place] [address]    Report Status PENDING  Incomplete  Blood Culture (routine x 2)     Status: None (Preliminary result)   Collection Time: 01/27/23 10:58 PM   Specimen: BLOOD  Result Value Ref Range Status   Specimen Description BLOOD BLOOD RIGHT ARM  Final   Special Requests   Final    BOTTLES DRAWN AEROBIC AND ANAEROBIC Blood Culture adequate volume   Culture   Final    NO GROWTH < 12 HOURS Performed at [facility], [address], [place], [place] [address]    Report Status PENDING  Incomplete  Resp panel by  RT-PCR (RSV, Flu A&B, Covid) Anterior Nasal Swab     Status: None   Collection Time: 01/28/23 12:25 AM   Specimen: Anterior Nasal Swab  Result Value Ref Range Status   SARS Coronavirus 2 by RT PCR NEGATIVE NEGATIVE Final    Comment: (NOTE) SARS-CoV-2 target nucleic acids are NOT DETECTED.  The SARS-CoV-2 RNA is generally detectable in upper respiratory specimens during the acute phase of infection. The lowest concentration of SARS-CoV-2 viral copies this assay can detect is 138 copies/mL. A negative result does not preclude SARS-Cov-2 infection and should not be used as the sole basis for treatment or other patient management decisions.  A negative result may occur with  improper specimen collection/handling, submission of specimen other than nasopharyngeal swab, presence of viral mutation(s) within the areas targeted by this assay, and inadequate number of viral copies(<138 copies/mL). A negative result must be combined with clinical observations, patient history, and epidemiological information. The expected result is Negative.  Fact Sheet for Patients:  BloggerCourse.com  Fact Sheet for Healthcare Providers:  SeriousBroker.it  This test is no t yet approved or cleared by the [place] FDA and  has been authorized for detection and/or diagnosis of SARS-CoV-2 by FDA under an Emergency Use Authorization (EUA). This EUA will remain  in effect (meaning this test can be used) for the duration of the COVID-19 declaration under Section 564(b)(1) of the Act, 21 U.S.C.section 360bbb-3(b)(1), unless the authorization is terminated  or revoked sooner.       Influenza A by PCR NEGATIVE NEGATIVE Final   Influenza B by PCR NEGATIVE NEGATIVE Final    Comment: (NOTE) The Xpert Xpress SARS-CoV-2/FLU/RSV plus assay is intended as an aid in the diagnosis of influenza from Nasopharyngeal swab specimens and should not be used as a sole basis  for treatment. Nasal washings and aspirates are unacceptable for Xpert Xpress SARS-CoV-2/FLU/RSV testing.  Fact Sheet for Patients: BloggerCourse.com  Fact Sheet for Healthcare Providers: SeriousBroker.it  This test is not yet approved or cleared by the [place] FDA and has been authorized for detection and/or diagnosis of SARS-CoV-2 by FDA under an Emergency Use Authorization (EUA). This EUA will remain in effect (meaning this test can be used) for the duration of the COVID-19 declaration under Section 564(b)(1) of the Act, 21 U.S.C. section 360bbb-3(b)(1), unless the authorization is terminated or revoked.     Resp Syncytial Virus by PCR NEGATIVE NEGATIVE Final    Comment: (NOTE) Fact Sheet for Patients: BloggerCourse.com  Fact Sheet for Healthcare Providers: SeriousBroker.it  This test is not yet approved or cleared by the [place] FDA and has been authorized for detection and/or diagnosis of SARS-CoV-2 by FDA under an Emergency Use Authorization (EUA). This EUA will remain in effect (meaning this test can be used) for the duration of the COVID-19 declaration under Section 564(b)(1) of the Act, 21 U.S.C. section 360bbb-3(b)(1), unless the authorization is terminated or revoked.  Performed at [facility], [address], [place], [place] [address]          Radiology Studies: [facility] Chest Frost 1 View  Result Date: 01/27/2023 CLINICAL DATA:  Shortness of breath EXAM: PORTABLE CHEST 1 VIEW COMPARISON:  07/14/2020 FINDINGS: Borderline cardiac enlargement. Mild central congestion. Probable small pleural effusions. Interstitial and patchy pulmonary opacity. Aortic atherosclerosis. No pneumothorax IMPRESSION: Borderline cardiac enlargement with mild central congestion and probable small pleural effusions. Interstitial and patchy pulmonary opacity, possible  superimposed infection/pneumonia Electronically Signed   By: Jasmine Pang M.D.   On: 01/27/2023 23:02        Scheduled Meds:  aspirin EC  81 mg Oral Daily   atorvastatin  40 mg Oral q1800   clopidogrel  75 mg Oral Daily   folic acid  1 mg Oral Daily   furosemide  40 mg Intravenous Daily   lamoTRIgine  100 mg Oral Daily   levothyroxine  100 mcg Oral Q0600   pantoprazole  40 mg Oral Daily   venlafaxine XR  150 mg Oral Daily   Continuous Infusions:  azithromycin 500 mg (01/28/23 2230)   cefTRIAXone (ROCEPHIN)  IV 2 g (01/28/23 2149)   heparin 850 Units/hr (  01/29/23 0039)     LOS: 2 days        Charise Killian, MD [facility] Pager [phone]  If 7PM-7AM, please contact night-coverage [email or web address] 01/29/2023, 9:02 AM

## 2023-01-29 NOTE — Progress Notes (Signed)
ANTICOAGULATION CONSULT NOTE  Pharmacy Consult for heparin infusion Indication: ACS/STEMI  Allergies  Allergen Reactions   Codeine Nausea And Vomiting and Other (See Comments)   Demerol [Meperidine] Nausea And Vomiting    Patient Measurements: Height: 5\' 7"  (170.2 cm) Weight: 66.1 kg (145 lb 11.2 oz) IBW/kg (Calculated) : 61.6 Heparin Dosing Weight: 66.1 kg  Vital Signs: Temp: 98.1 F (36.7 C) (10/30 0542) Temp Source: Oral (10/30 0542) BP: 139/77 (10/30 0542) Pulse Rate: 89 (10/30 0542)  Labs: Recent Labs    01/27/23 1915 01/27/23 2122 01/28/23 0025 01/28/23 0500 01/28/23 0844 01/28/23 1712 01/29/23 0535  HGB 10.8*  --   --  9.6*  --   --   --   HCT 33.3*  --   --  29.7*  --   --   --   PLT 264  --   --  201  --   --   --   APTT  --   --  30  --   --   --   --   LABPROT  --   --  15.7*  --   --   --   --   INR  --   --  1.2  --   --   --   --   HEPARINUNFRC  --   --   --   --  0.55 0.34 0.35  CREATININE 0.93  --   --  0.99  --   --  1.06*  TROPONINIHS 201* 626*  --   --  690*  --   --     Estimated Creatinine Clearance: 42.5 mL/min (A) (by C-G formula based on SCr of 1.06 mg/dL (H)).   Medical History: Past Medical History:  Diagnosis Date   Collagen vascular disease (HCC)    Depression    Hyperlipidemia    Muscle pain    Neuropathy    RA (rheumatoid arthritis) (HCC)    Thyroid disease    Vitamin B 12 deficiency     Assessment: Pt is a 77 yo female presenting to ED c/o SOB and O2 sats 88% (code sepsis called), found with elevated troponin I level, trending up. ECG is significant prolonged QT interval. Patient is not on chronic anticoagulation PTA per chart review.  Goal of Therapy:  Heparin level 0.3-0.7 units/ml Monitor platelets by anticoagulation protocol: Yes  10/29 @ 1712 0.34 10/30 0535 0.35, therapeutic x 3   Plan:  Heparin level is therapeutic x 3 Continue heparin infusion at 850 units/hr Check Heparin level daily with morning labs  while therapeutic CBC daily while on heparin  Otelia Sergeant, PharmD, [facility] 01/29/2023 6:51 AM

## 2023-01-30 DIAGNOSIS — J9601 Acute respiratory failure with hypoxia: Secondary | ICD-10-CM | POA: Diagnosis not present

## 2023-01-30 LAB — CBC
HCT: 30.9 % — ABNORMAL LOW (ref 36.0–46.0)
Hemoglobin: 10.2 g/dL — ABNORMAL LOW (ref 12.0–15.0)
MCH: 33.6 pg (ref 26.0–34.0)
MCHC: 33 g/dL (ref 30.0–36.0)
MCV: 101.6 fL — ABNORMAL HIGH (ref 80.0–100.0)
Platelets: 255 10*3/uL (ref 150–400)
RBC: 3.04 MIL/uL — ABNORMAL LOW (ref 3.87–5.11)
RDW: 17.1 % — ABNORMAL HIGH (ref 11.5–15.5)
WBC: 5.2 10*3/uL (ref 4.0–10.5)
nRBC: 0 % (ref 0.0–0.2)

## 2023-01-30 LAB — BASIC METABOLIC PANEL
Anion gap: 8 (ref 5–15)
BUN: 14 mg/dL (ref 8–23)
CO2: 27 mmol/L (ref 22–32)
Calcium: 8.1 mg/dL — ABNORMAL LOW (ref 8.9–10.3)
Chloride: 102 mmol/L (ref 98–111)
Creatinine, Ser: 0.67 mg/dL (ref 0.44–1.00)
GFR, Estimated: >60 mL/min (ref >60)
Glucose, Bld: 89 mg/dL (ref 70–99)
Potassium: 3 mmol/L — ABNORMAL LOW (ref 3.5–5.1)
Sodium: 137 mmol/L (ref 135–145)

## 2023-01-30 LAB — MAGNESIUM: Magnesium: 2 mg/dL (ref 1.7–2.4)

## 2023-01-30 LAB — C-REACTIVE PROTEIN: CRP: 9.3 mg/dL — ABNORMAL HIGH (ref <1.0)

## 2023-01-30 LAB — TROPONIN I (HIGH SENSITIVITY): Troponin I (High Sensitivity): 214 ng/L (ref <18)

## 2023-01-30 LAB — HEPARIN LEVEL (UNFRACTIONATED): Heparin Unfractionated: 0.23 [IU]/mL — ABNORMAL LOW (ref 0.30–0.70)

## 2023-01-30 MED ORDER — POTASSIUM CHLORIDE CRYS ER 20 MEQ PO TBCR
40.0000 meq | EXTENDED_RELEASE_TABLET | Freq: Two times a day (BID) | ORAL | Status: AC
  Administered 2023-01-30 (×2): 40 meq via ORAL
  Filled 2023-01-30 (×2): qty 2

## 2023-01-30 MED ORDER — DEXAMETHASONE SODIUM PHOSPHATE 10 MG/ML IJ SOLN
4.0000 mg | INTRAMUSCULAR | Status: DC
  Administered 2023-01-30 – 2023-02-05 (×7): 4 mg via INTRAVENOUS
  Filled 2023-01-30 (×7): qty 1

## 2023-01-30 MED ORDER — HEPARIN BOLUS VIA INFUSION
1000.0000 [IU] | Freq: Once | INTRAVENOUS | Status: AC
  Administered 2023-01-30: 1000 [IU] via INTRAVENOUS
  Filled 2023-01-30: qty 1000

## 2023-01-30 MED ORDER — SPIRONOLACTONE 25 MG PO TABS
50.0000 mg | ORAL_TABLET | Freq: Every day | ORAL | Status: DC
Start: 2023-01-30 — End: 2023-02-05
  Administered 2023-01-30 – 2023-02-05 (×7): 50 mg via ORAL
  Filled 2023-01-30 (×7): qty 2

## 2023-01-30 MED ORDER — AZITHROMYCIN 250 MG PO TABS
500.0000 mg | ORAL_TABLET | Freq: Every day | ORAL | Status: AC
  Administered 2023-01-30 – 2023-01-31 (×2): 500 mg via ORAL
  Filled 2023-01-30 (×2): qty 2

## 2023-01-30 MED ORDER — ENOXAPARIN SODIUM 40 MG/0.4ML IJ SOSY
40.0000 mg | PREFILLED_SYRINGE | INTRAMUSCULAR | Status: DC
  Administered 2023-01-30 – 2023-02-04 (×6): 40 mg via SUBCUTANEOUS
  Filled 2023-01-30 (×6): qty 0.4

## 2023-01-30 NOTE — Progress Notes (Signed)
ANTICOAGULATION CONSULT NOTE  Pharmacy Consult for heparin infusion Indication: ACS/STEMI  Allergies  Allergen Reactions   Codeine Nausea And Vomiting and Other (See Comments)   Demerol [Meperidine] Nausea And Vomiting    Patient Measurements: Height: 5\' 7"  (170.2 cm) Weight: 66.1 kg (145 lb 11.2 oz) IBW/kg (Calculated) : 61.6 Heparin Dosing Weight: 66.1 kg  Vital Signs: Temp: 98.2 F (36.8 C) (10/31 0358) BP: 166/92 (10/31 0358) Pulse Rate: 100 (10/31 0358)  Labs: Recent Labs    01/27/23 1915 01/27/23 2122 01/28/23 0025 01/28/23 0500 01/28/23 0844 01/28/23 0844 01/28/23 1712 01/29/23 0535 01/30/23 0431  HGB 10.8*  --   --  9.6*  --   --   --  9.7* 10.2*  HCT 33.3*  --   --  29.7*  --   --   --  30.5* 30.9*  PLT 264  --   --  201  --   --   --  248 255  APTT  --   --  30  --   --   --   --   --   --   LABPROT  --   --  15.7*  --   --   --   --   --   --   INR  --   --  1.2  --   --   --   --   --   --   HEPARINUNFRC  --   --   --   --  0.55   < > 0.34 0.35 0.23*  CREATININE 0.93  --   --  0.99  --   --   --  1.06* 0.67  TROPONINIHS 201* 626*  --   --  690*  --   --   --   --    < > = values in this interval not displayed.    Estimated Creatinine Clearance: 56.4 mL/min (by C-G formula based on SCr of 0.67 mg/dL).   Medical History: Past Medical History:  Diagnosis Date   Collagen vascular disease (HCC)    Depression    Hyperlipidemia    Muscle pain    Neuropathy    RA (rheumatoid arthritis) (HCC)    Thyroid disease    Vitamin B 12 deficiency     Assessment: Pt is a 77 yo female presenting to ED c/o SOB and O2 sats 88% (code sepsis called), found with elevated troponin I level, trending up. ECG is significant prolonged QT interval. Patient is not on chronic anticoagulation PTA per chart review.  Goal of Therapy:  Heparin level 0.3-0.7 units/ml Monitor platelets by anticoagulation protocol: Yes  10/29 @ 1712 0.34 10/30 0535 0.35, therapeutic x  3 10/31 0431 0.23, subtherapeutic   Plan:  Bolus 1000 units x 1 Increase heparin infusion to 1000 units/hr Recheck HL in 8 hrs after rate change. CBC daily while on heparin  Otelia Sergeant, PharmD, [facility] 01/30/2023 6:49 AM

## 2023-01-30 NOTE — Plan of Care (Signed)
  Problem: Education: Goal: Knowledge of General Education information will improve Description: Including pain rating scale, medication(s)/side effects and non-pharmacologic comfort measures 01/30/2023 1740 by Serita Grit, RN Outcome: Progressing 01/30/2023 1740 by Serita Grit, RN Outcome: Not Progressing   Problem: Health Behavior/Discharge Planning: Goal: Ability to manage health-related needs will improve 01/30/2023 1740 by Serita Grit, RN Outcome: Progressing 01/30/2023 1740 by Serita Grit, RN Outcome: Not Progressing   Problem: Clinical Measurements: Goal: Ability to maintain clinical measurements within normal limits will improve 01/30/2023 1740 by Serita Grit, RN Outcome: Progressing 01/30/2023 1740 by Serita Grit, RN Outcome: Not Progressing Goal: Will remain free from infection 01/30/2023 1740 by Serita Grit, RN Outcome: Progressing 01/30/2023 1740 by Serita Grit, RN Outcome: Not Progressing Goal: Diagnostic test results will improve 01/30/2023 1740 by Serita Grit, RN Outcome: Progressing 01/30/2023 1740 by Serita Grit, RN Outcome: Not Progressing Goal: Respiratory complications will improve 01/30/2023 1740 by Serita Grit, RN Outcome: Progressing 01/30/2023 1740 by Serita Grit, RN Outcome: Not Progressing Goal: Cardiovascular complication will be avoided 01/30/2023 1740 by Serita Grit, RN Outcome: Progressing 01/30/2023 1740 by Serita Grit, RN Outcome: Not Progressing   Problem: Activity: Goal: Risk for activity intolerance will decrease 01/30/2023 1740 by Serita Grit, RN Outcome: Progressing 01/30/2023 1740 by Serita Grit, RN Outcome: Not Progressing   Problem: Nutrition: Goal: Adequate nutrition will be maintained 01/30/2023 1740 by Serita Grit, RN Outcome: Progressing 01/30/2023 1740 by Serita Grit, RN Outcome: Not Progressing   Problem:  Coping: Goal: Level of anxiety will decrease 01/30/2023 1740 by Serita Grit, RN Outcome: Progressing 01/30/2023 1740 by Serita Grit, RN Outcome: Not Progressing   Problem: Elimination: Goal: Will not experience complications related to bowel motility 01/30/2023 1740 by Serita Grit, RN Outcome: Progressing 01/30/2023 1740 by Serita Grit, RN Outcome: Not Progressing Goal: Will not experience complications related to urinary retention 01/30/2023 1740 by Serita Grit, RN Outcome: Progressing 01/30/2023 1740 by Serita Grit, RN Outcome: Not Progressing   Problem: Pain Management: Goal: General experience of comfort will improve 01/30/2023 1740 by Serita Grit, RN Outcome: Progressing 01/30/2023 1740 by Serita Grit, RN Outcome: Not Progressing   Problem: Safety: Goal: Ability to remain free from injury will improve 01/30/2023 1740 by Serita Grit, RN Outcome: Progressing 01/30/2023 1740 by Serita Grit, RN Outcome: Not Progressing   Problem: Skin Integrity: Goal: Risk for impaired skin integrity will decrease 01/30/2023 1740 by Serita Grit, RN Outcome: Progressing 01/30/2023 1740 by Serita Grit, RN Outcome: Not Progressing

## 2023-01-30 NOTE — Plan of Care (Signed)
  Problem: Education: Goal: Knowledge of General Education information will improve Description: Including pain rating scale, medication(s)/side effects and non-pharmacologic comfort measures Outcome: Not Progressing   Problem: Health Behavior/Discharge Planning: Goal: Ability to manage health-related needs will improve Outcome: Not Progressing   Problem: Clinical Measurements: Goal: Respiratory complications will improve Outcome: Not Progressing   Problem: Activity: Goal: Risk for activity intolerance will decrease Outcome: Progressing   Patient is very confused, yelling out for her husband and making attempts to elope. Patient is disoriented to time.

## 2023-01-30 NOTE — Progress Notes (Signed)
Patient believes she has been kidnapped. Told staff that we have her tied down and took her phone away from her. Patient has no restraints or mittens. Patient has removed her clothes and oxygen. Patient does not know where her husband is, event hough she has been told multiple times that her husband is at home. Patient's phone was on the counter charging. Staff gave her the phone and she does not know how to call her husband. Patient is using profanity and threatening to have staff fired.

## 2023-01-30 NOTE — Consult Note (Signed)
PULMONOLOGY         Date: 01/30/2023,   MRN# [id number] [name] [birth year]     AdmissionWeight: 66.1 kg                 CurrentWeight: 66.1 kg  Referring provider: Dr Joylene Igo   CHIEF COMPLAINT:   Acute on chronic hypoxemic respiratory failure   HISTORY OF PRESENT ILLNESS    [name] is a 77 year old female with history of hypothyroid, anxiety, hyperlipidemia, hypertension, GERD, depression, history of heart failure with preserved ejection fraction, chronic respiratory failure on 2 L nasal cannula at baseline, rheumatoid arthritis, memory difficulty, history of stroke, who presents emergency department for chief concerns of shortness of breath.She requied 10L/min HFNC.  She had hypokalemia on BMP.  She had elevated BNP. She had CTPE with no PE and findig of PNA.  PCCM for further evaluation and management.      PAST MEDICAL HISTORY   Past Medical History:  Diagnosis Date   Collagen vascular disease (HCC)    Depression    Hyperlipidemia    Muscle pain    Neuropathy    RA (rheumatoid arthritis) (HCC)    Thyroid disease    Vitamin B 12 deficiency      SURGICAL HISTORY   Past Surgical History:  Procedure Laterality Date   ABDOMINAL HYSTERECTOMY     BREAST BIOPSY Left    2 benign biopsies   ECTOPIC PREGNANCY SURGERY     KNEE SURGERY Right    LAMINECTOMY     leg vein stripping Bilateral    TONSILLECTOMY       FAMILY HISTORY   Family History  Problem Relation Age of Onset   COPD Father    Cancer Father    Breast cancer Cousin    Breast cancer Maternal Aunt    Breast cancer Maternal Aunt      SOCIAL HISTORY   Social History   Tobacco Use   Smoking status: Never    Passive exposure: Never   Smokeless tobacco: Never     MEDICATIONS    Home Medication:    Current Medication:  Current Facility-Administered Medications:    acetaminophen (TYLENOL) tablet 650 mg, 650 mg, Oral, Q6H PRN, 650 mg at 01/29/23 0617 **OR**  acetaminophen (TYLENOL) suppository 650 mg, 650 mg, Rectal, Q6H PRN, Cox, Amy N, DO   aspirin EC tablet 81 mg, 81 mg, Oral, Daily, Cox, Amy N, DO, 81 mg at 01/30/23 0915   atorvastatin (LIPITOR) tablet 40 mg, 40 mg, Oral, q1800, Cox, Amy N, DO, 40 mg at 01/29/23 1644   azithromycin (ZITHROMAX) 500 mg in sodium chloride 0.9 % 250 mL IVPB, 500 mg, Intravenous, Q24H, Cox, Amy N, DO, Last Rate: 250 mL/hr at 01/29/23 2248, 500 mg at 01/29/23 2248   cefTRIAXone (ROCEPHIN) 2 g in sodium chloride 0.9 % 100 mL IVPB, 2 g, Intravenous, Q24H, Cox, Amy N, DO, Last Rate: 200 mL/hr at 01/29/23 2143, 2 g at 01/29/23 2143   clopidogrel (PLAVIX) tablet 75 mg, 75 mg, Oral, Daily, Cox, Amy N, DO, 75 mg at 01/30/23 0915   folic acid (FOLVITE) tablet 1 mg, 1 mg, Oral, Daily, Cox, Amy N, DO, 1 mg at 01/30/23 0915   hydrALAZINE (APRESOLINE) injection 5 mg, 5 mg, Intravenous, Q8H PRN, Cox, Amy N, DO   ipratropium-albuterol (DUONEB) 0.5-2.5 (3) MG/3ML nebulizer solution 3 mL, 3 mL, Nebulization, Q4H PRN, Andris Baumann, MD, 3 mL at 01/29/23 0606   lamoTRIgine (  LAMICTAL) tablet 100 mg, 100 mg, Oral, Daily, Cox, Amy N, DO, 100 mg at 01/30/23 0915   levothyroxine (SYNTHROID) tablet 100 mcg, 100 mcg, Oral, Q0600, Cox, Amy N, DO, 100 mcg at 01/30/23 0555   ondansetron (ZOFRAN) tablet 4 mg, 4 mg, Oral, Q6H PRN **OR** ondansetron (ZOFRAN) injection 4 mg, 4 mg, Intravenous, Q6H PRN, Cox, Amy N, DO   pantoprazole (PROTONIX) EC tablet 40 mg, 40 mg, Oral, Daily, Cox, Amy N, DO, 40 mg at 01/30/23 0915   potassium chloride SA (KLOR-CON M) CR tablet 40 mEq, 40 mEq, Oral, BID, Charise Killian, MD, 40 mEq at 01/30/23 0915   senna-docusate (Senokot-S) tablet 1 tablet, 1 tablet, Oral, QHS PRN, Cox, Amy N, DO   venlafaxine XR (EFFEXOR-XR) 24 hr capsule 150 mg, 150 mg, Oral, Daily, Cox, Amy N, DO, 150 mg at 01/30/23 0915    ALLERGIES   Codeine and Demerol [meperidine]     REVIEW OF SYSTEMS    Review of Systems:  Gen:  Denies   fever, sweats, chills weigh loss  HEENT: Denies blurred vision, double vision, ear pain, eye pain, hearing loss, nose bleeds, sore throat Cardiac:  No dizziness, chest pain or heaviness, chest tightness,edema Resp:   reports dyspnea chronically  Gi: Denies swallowing difficulty, stomach pain, nausea or vomiting, diarrhea, constipation, bowel incontinence Gu:  Denies bladder incontinence, burning urine Ext:   Denies Joint pain, stiffness or swelling Skin: Denies  skin rash, easy bruising or bleeding or hives Endoc:  Denies polyuria, polydipsia , polyphagia or weight change Psych:   Denies depression, insomnia or hallucinations   Other:  All other systems negative   VS: BP (!) 168/99 (BP Location: Right Arm)   Pulse (!) 102   Temp 98.2 F (36.8 C)   Resp 18   Ht 5\' 7"  (1.702 m)   Wt 66.1 kg   SpO2 96%   BMI 22.82 kg/m      PHYSICAL EXAM    GENERAL:NAD, no fevers, chills, no weakness no fatigue HEAD: Normocephalic, atraumatic.  EYES: Pupils equal, round, reactive to light. Extraocular muscles intact. No scleral icterus.  MOUTH: Moist mucosal membrane. Dentition intact. No abscess noted.  EAR, NOSE, THROAT: Clear without exudates. No external lesions.  NECK: Supple. No thyromegaly. No nodules. No JVD.  PULMONARY: decreased breath sounds with mild rhonchi worse at bases bilaterally.  CARDIOVASCULAR: S1 and S2. Regular rate and rhythm. No murmurs, rubs, or gallops. No edema. Pedal pulses 2+ bilaterally.  GASTROINTESTINAL: Soft, nontender, nondistended. No masses. Positive bowel sounds. No hepatosplenomegaly.  MUSCULOSKELETAL: No swelling, clubbing, or edema. Range of motion full in all extremities.  NEUROLOGIC: Cranial nerves II through XII are intact. No gross focal neurological deficits. Sensation intact. Reflexes intact.  SKIN: No ulceration, lesions, rashes, or cyanosis. Skin warm and dry. Turgor intact.  PSYCHIATRIC: Mood, affect within normal limits. The patient is awake,  alert and oriented x 3. Insight, judgment intact.       IMAGING    Narrative & Impression  CLINICAL DATA:  High probability for PE. History of pulmonary fibrosis and emphysema.   EXAM: CT ANGIOGRAPHY CHEST WITH CONTRAST   TECHNIQUE: Multidetector CT imaging of the chest was performed using the standard protocol during bolus administration of intravenous contrast. Multiplanar CT image reconstructions and MIPs were obtained to evaluate the vascular anatomy.   RADIATION DOSE REDUCTION: This exam was performed according to the departmental dose-optimization program which includes automated exposure control, adjustment of the mA and/or kV according to  patient size and/or use of iterative reconstruction technique.   CONTRAST:  75mL OMNIPAQUE IOHEXOL 350 MG/ML SOLN   COMPARISON:  Chest CT 09/23/2018   FINDINGS: Cardiovascular: Heart is enlarged. Aorta is normal in size. There are atherosclerotic calcifications of the aorta. There is adequate opacification of the pulmonary arteries to the segmental level. There is no evidence for pulmonary embolism.   Mediastinum/Nodes: There is an enlarged precarinal lymph node measuring 12 mm, unchanged. There is an enlarged right hilar lymph node measuring 11 mm, new from prior. Visualized esophagus and thyroid gland are within normal limits.   Lungs/Pleura: There is a trace right pleural effusion similar to prior. Severe emphysema again noted. Chronic appearing interstitial changes and bronchiectasis are again noted in both lower lobes. There is new right lower lobe peribronchial wall thickening. There are new patchy ground-glass opacities in the inferior upper lobes, right middle lobe and minimally in the lower lobes. There is no evidence for pneumothorax.   Upper Abdomen: No acute abnormality.   Musculoskeletal: No chest wall abnormality. No acute or significant osseous findings.   Review of the MIP images confirms the above  findings.   IMPRESSION: 1. No evidence for pulmonary embolism. 2. New patchy ground-glass opacities in the inferior upper lobes, right middle lobe and minimally in the lower lobes worrisome for multifocal pneumonia. 3. New right lower lobe peribronchial wall thickening worrisome for bronchitis. 4. Stable trace right pleural effusion. 5. Stable chronic interstitial changes and bronchiectasis in the lower lobes. 6. Stable mediastinal lymphadenopathy. New right hilar lymphadenopathy.   Aortic Atherosclerosis (ICD10-I70.0) and Emphysema (ICD10-J43.9).     Electronically Signed   By: Darliss Cheney M.D.    ASSESSMENT/PLAN   Acute on chronic hypoxemic respiratory failure Patient is currently improved on 4L/min [place] -due to acute exacerbation of COPD with RA related pulmonary fibrosis -RVP is negative but she seems to have pneumonia  - may be false negative COVID strain -treating with dexamethasone 4mg  IV due to elevated crp with hx of RA -nebulizer therapy  -IS and flutter valve for chest PT Metaneb -OOB to chair    Combined pulmonary fibrosis and emphysema (CPFE) - continue therapy as above   Thank you for allowing me to participate in the care of this patient.   Patient/Family are satisfied with care plan and all questions have been answered.    Provider disclosure: Patient with at least one acute or chronic illness or injury that poses a threat to life or bodily function and is being managed actively during this encounter.  All of the below services have been performed independently by signing provider:  review of prior documentation from internal and or external health records.  Review of previous and current lab results.  Interview and comprehensive assessment during patient visit today. Review of current and previous chest radiographs/CT scans. Discussion of management and test interpretation with health care team and patient/family.   This document was prepared using  Dragon voice recognition software and may include unintentional dictation errors.     Vida Rigger, M.D.  Division of Pulmonary & Critical Care Medicine

## 2023-01-30 NOTE — Progress Notes (Signed)
Transition of Care [facility]) - Inpatient Brief Assessment   Patient Details  Name: [name] MRN: [id number] Date of Birth: [birth year]  Transition of Care [facility]) CM/SW Contact:    Truddie Hidden, RN Phone Number: 01/30/2023, 3:52 PM   Clinical Narrative: TOC continuing to follow patient's progress throughout discharge planning.   Transition of Care Asessment: Insurance and Status: Insurance coverage has been reviewed Patient has primary care physician: Yes Home environment has been reviewed: home Prior level of function:: independent Prior/Current Home Services: No current home services Social Determinants of Health Reivew: SDOH reviewed no interventions necessary Readmission risk has been reviewed: Yes Transition of care needs: no transition of care needs at this time

## 2023-01-30 NOTE — Progress Notes (Signed)
PROGRESS NOTE    [name]  ZOX:[id number] DOB: 1946-02-06 DOA: 01/27/2023 PCP: Lynnea Ferrier, MD   Assessment & Plan:   Principal Problem:   Acute hypoxemic respiratory failure (HCC) Active Problems:   Rheumatoid arthritis with rheumatoid factor (HCC)   Elevated troponin   Hyperlipidemia   Hypothyroidism due to acquired atrophy of thyroid   Immunosuppression (HCC)   Pulmonary fibrosis (HCC)   Recurrent major depressive disorder, in partial remission (HCC)   Sleep apnea   Hypertension   History of stroke  Assessment and Plan: Acute on chronic hypoxic respiratory failure: likely secondary to CAP. Continue on IV ceftriaxone, azithromycin, bronchodilators & encourage incentive spirometry. Started on IV steroids as per pulmon. D-dimer was elevated. CTA chest shows multifocal pneumonia but no PE. CRP was elevated, resp viral panel was neg. Pulmon following and recs apprec. Hx of pulmon fibrosis, emphysema & bronchiectasis. Continue on supplemental oxygen and wean back to baseline as tolerated. Uses 2L [place] at home    CAP: continue on IV ceftriaxone, azithromycin, bronchodilators & encourage incentive spirometry.   Possible acute on chronic diastolic CHF: s/p IV lasix x 3. Monitor I/Os   Sleep apnea: does not use CPAP    Recurrent major depressive disorder: in partial remission. Continue on home dose of venlafaxine   Hypothyroidism: continue on levothyroxine    HLD: continue on statin    Elevated troponin: likely secondary to demand ischemia. No chest pain. Trending down now. D/c IV heparin drip. CTA chest was neg for PE   Rheumatoid arthritis: on methotrexate weekly         DVT prophylaxis: IV heparin  Code Status: full  Family Communication: discussed pt's care w/ pt's family at bedside and answered their questions  Disposition Plan: PT/OT consulted   Level of care: Telemetry Cardiac  Status is: Inpatient Remains inpatient appropriate because: severity of  illness    Consultants:  Pulmon   Procedures:   Antimicrobials:  Subjective: Pt c/o malaise   Objective: Vitals:   01/29/23 2023 01/29/23 2326 01/30/23 0358 01/30/23 0743  BP: 139/78 121/86 (!) 166/92 (!) 168/99  Pulse: (!) 102 86 100 (!) 102  Resp: 19 20 18 18   Temp: 98.1 F (36.7 C) 98.3 F (36.8 C) 98.2 F (36.8 C) 98.2 F (36.8 C)  TempSrc:      SpO2: 93% 95% 96% 96%  Weight:      Height:        Intake/Output Summary (Last 24 hours) at 01/30/2023 0831 Last data filed at 01/30/2023 0300 Gross per 24 hour  Intake 878.53 ml  Output 1300 ml  Net -421.47 ml   Filed Weights   01/27/23 1911  Weight: 66.1 kg    Examination:  General exam: Appears lethargic Respiratory system: diminished breath sounds b/l  Cardiovascular system: S1/S2+. No rubs or clicks  Gastrointestinal system: Abd is soft, NT, ND & hypoactive bowel sounds Central nervous system: lethargic. Moves all extremities  Psychiatry: Judgement and insight appears poor. Flat mood and affect    Data Reviewed: I have personally reviewed following labs and imaging studies  CBC: Recent Labs  Lab 01/27/23 1915 01/28/23 0500 01/29/23 0535 01/30/23 0431  WBC 11.6* 4.9 6.1 5.2  NEUTROABS 6.6  --   --   --   HGB 10.8* 9.6* 9.7* 10.2*  HCT 33.3* 29.7* 30.5* 30.9*  MCV 105.4* 104.9* 106.3* 101.6*  PLT 264 201 248 255   Basic Metabolic Panel: Recent Labs  Lab 01/27/23 1915 01/28/23  0025 01/28/23 0500 01/29/23 0535 01/30/23 0431  NA 136  --  138 139 137  K 3.8  --  3.4* 3.5 3.0*  CL 103  --  105 106 102  CO2 21*  --  25 26 27   GLUCOSE 198*  --  160* 92 89  BUN 12  --  17 29* 14  CREATININE 0.93  --  0.99 1.06* 0.67  CALCIUM 8.5*  --  7.8* 7.9* 8.1*  MG  --  1.8  --  2.2 2.0   GFR: Estimated Creatinine Clearance: 56.4 mL/min (by C-G formula based on SCr of 0.67 mg/dL). Liver Function Tests: Recent Labs  Lab 01/27/23 1915  AST 24  ALT 16  ALKPHOS 76  BILITOT 1.4*  PROT 7.4   ALBUMIN 3.6   No results for input(s): "LIPASE", "AMYLASE" in the last 168 hours. No results for input(s): "AMMONIA" in the last 168 hours. Coagulation Profile: Recent Labs  Lab 01/28/23 0025  INR 1.2   Cardiac Enzymes: No results for input(s): "CKTOTAL", "CKMB", "CKMBINDEX", "TROPONINI" in the last 168 hours. BNP (last 3 results) No results for input(s): "PROBNP" in the last 8760 hours. HbA1C: No results for input(s): "HGBA1C" in the last 72 hours. CBG: No results for input(s): "GLUCAP" in the last 168 hours. Lipid Profile: No results for input(s): "CHOL", "HDL", "LDLCALC", "TRIG", "CHOLHDL", "LDLDIRECT" in the last 72 hours. Thyroid Function Tests: No results for input(s): "TSH", "T4TOTAL", "FREET4", "T3FREE", "THYROIDAB" in the last 72 hours. Anemia Panel: No results for input(s): "VITAMINB12", "FOLATE", "FERRITIN", "TIBC", "IRON", "RETICCTPCT" in the last 72 hours. Sepsis Labs: Recent Labs  Lab 01/27/23 2258 01/28/23 0025 01/28/23 0031 01/28/23 0500 01/29/23 0535  PROCALCITON  --  5.71  --  8.05 8.86  LATICACIDVEN 1.9  --  2.0*  --   --     Recent Results (from the past 240 hour(s))  Blood Culture (routine x 2)     Status: None (Preliminary result)   Collection Time: 01/27/23  7:22 PM   Specimen: BLOOD  Result Value Ref Range Status   Specimen Description BLOOD RIGHT ASSIST CONTROL  Final   Special Requests   Final    BOTTLES DRAWN AEROBIC AND ANAEROBIC Blood Culture results may not be optimal due to an excessive volume of blood received in culture bottles   Culture   Final    NO GROWTH 3 DAYS Performed at [facility], [address], [place], [place] [address]    Report Status PENDING  Incomplete  Blood Culture (routine x 2)     Status: None (Preliminary result)   Collection Time: 01/27/23 10:58 PM   Specimen: BLOOD  Result Value Ref Range Status   Specimen Description BLOOD BLOOD RIGHT ARM  Final   Special Requests   Final    BOTTLES DRAWN  AEROBIC AND ANAEROBIC Blood Culture adequate volume   Culture   Final    NO GROWTH 3 DAYS Performed at [facility], [address] [place], [place] [address]    Report Status PENDING  Incomplete  Resp panel by RT-PCR (RSV, Flu A&B, Covid) Anterior Nasal Swab     Status: None   Collection Time: 01/28/23 12:25 AM   Specimen: Anterior Nasal Swab  Result Value Ref Range Status   SARS Coronavirus 2 by RT PCR NEGATIVE NEGATIVE Final    Comment: (NOTE) SARS-CoV-2 target nucleic acids are NOT DETECTED.  The SARS-CoV-2 RNA is generally detectable in upper respiratory specimens during the acute phase  of infection. The lowest concentration of SARS-CoV-2 viral copies this assay can detect is 138 copies/mL. A negative result does not preclude SARS-Cov-2 infection and should not be used as the sole basis for treatment or other patient management decisions. A negative result may occur with  improper specimen collection/handling, submission of specimen other than nasopharyngeal swab, presence of viral mutation(s) within the areas targeted by this assay, and inadequate number of viral copies(<138 copies/mL). A negative result must be combined with clinical observations, patient history, and epidemiological information. The expected result is Negative.  Fact Sheet for Patients:  BloggerCourse.com  Fact Sheet for Healthcare Providers:  SeriousBroker.it  This test is no t yet approved or cleared by the [place] FDA and  has been authorized for detection and/or diagnosis of SARS-CoV-2 by FDA under an Emergency Use Authorization (EUA). This EUA will remain  in effect (meaning this test can be used) for the duration of the COVID-19 declaration under Section 564(b)(1) of the Act, 21 U.S.C.section 360bbb-3(b)(1), unless the authorization is terminated  or revoked sooner.       Influenza A by PCR NEGATIVE NEGATIVE Final   Influenza B  by PCR NEGATIVE NEGATIVE Final    Comment: (NOTE) The Xpert Xpress SARS-CoV-2/FLU/RSV plus assay is intended as an aid in the diagnosis of influenza from Nasopharyngeal swab specimens and should not be used as a sole basis for treatment. Nasal washings and aspirates are unacceptable for Xpert Xpress SARS-CoV-2/FLU/RSV testing.  Fact Sheet for Patients: BloggerCourse.com  Fact Sheet for Healthcare Providers: SeriousBroker.it  This test is not yet approved or cleared by the [place] FDA and has been authorized for detection and/or diagnosis of SARS-CoV-2 by FDA under an Emergency Use Authorization (EUA). This EUA will remain in effect (meaning this test can be used) for the duration of the COVID-19 declaration under Section 564(b)(1) of the Act, 21 U.S.C. section 360bbb-3(b)(1), unless the authorization is terminated or revoked.     Resp Syncytial Virus by PCR NEGATIVE NEGATIVE Final    Comment: (NOTE) Fact Sheet for Patients: BloggerCourse.com  Fact Sheet for Healthcare Providers: SeriousBroker.it  This test is not yet approved or cleared by the [place] FDA and has been authorized for detection and/or diagnosis of SARS-CoV-2 by FDA under an Emergency Use Authorization (EUA). This EUA will remain in effect (meaning this test can be used) for the duration of the COVID-19 declaration under Section 564(b)(1) of the Act, 21 U.S.C. section 360bbb-3(b)(1), unless the authorization is terminated or revoked.  Performed at [facility], [address] [place], [place] [address]   Respiratory (~20 pathogens) panel by PCR     Status: None   Collection Time: 01/29/23  2:44 PM   Specimen: Nasopharyngeal Swab; Respiratory  Result Value Ref Range Status   Adenovirus NOT DETECTED NOT DETECTED Final   Coronavirus 229E NOT DETECTED NOT DETECTED Final    Comment:  (NOTE) The Coronavirus on the Respiratory Panel, DOES NOT test for the novel  Coronavirus (2019 nCoV)    Coronavirus HKU1 NOT DETECTED NOT DETECTED Final   Coronavirus NL63 NOT DETECTED NOT DETECTED Final   Coronavirus OC43 NOT DETECTED NOT DETECTED Final   Metapneumovirus NOT DETECTED NOT DETECTED Final   Rhinovirus / Enterovirus NOT DETECTED NOT DETECTED Final   Influenza A NOT DETECTED NOT DETECTED Final   Influenza B NOT DETECTED NOT DETECTED Final   Parainfluenza Virus 1 NOT DETECTED NOT DETECTED Final   Parainfluenza Virus 2 NOT DETECTED NOT DETECTED Final  Parainfluenza Virus 3 NOT DETECTED NOT DETECTED Final   Parainfluenza Virus 4 NOT DETECTED NOT DETECTED Final   Respiratory Syncytial Virus NOT DETECTED NOT DETECTED Final   Bordetella pertussis NOT DETECTED NOT DETECTED Final   Bordetella Parapertussis NOT DETECTED NOT DETECTED Final   Chlamydophila pneumoniae NOT DETECTED NOT DETECTED Final   Mycoplasma pneumoniae NOT DETECTED NOT DETECTED Final    Comment: Performed at [facility], [address] [place], [place] [address]         Radiology Studies: CT Angio Chest Pulmonary Embolism (PE) W or WO Contrast  Result Date: 01/29/2023 CLINICAL DATA:  High probability for PE. History of pulmonary fibrosis and emphysema. EXAM: CT ANGIOGRAPHY CHEST WITH CONTRAST TECHNIQUE: Multidetector CT imaging of the chest was performed using the standard protocol during bolus administration of intravenous contrast. Multiplanar CT image reconstructions and MIPs were obtained to evaluate the vascular anatomy. RADIATION DOSE REDUCTION: This exam was performed according to the departmental dose-optimization program which includes automated exposure control, adjustment of the mA and/or kV according to patient size and/or use of iterative reconstruction technique. CONTRAST:  75mL OMNIPAQUE IOHEXOL 350 MG/ML SOLN COMPARISON:  Chest CT 09/23/2018 FINDINGS: Cardiovascular: Heart is enlarged.  Aorta is normal in size. There are atherosclerotic calcifications of the aorta. There is adequate opacification of the pulmonary arteries to the segmental level. There is no evidence for pulmonary embolism. Mediastinum/Nodes: There is an enlarged precarinal lymph node measuring 12 mm, unchanged. There is an enlarged right hilar lymph node measuring 11 mm, new from prior. Visualized esophagus and thyroid gland are within normal limits. Lungs/Pleura: There is a trace right pleural effusion similar to prior. Severe emphysema again noted. Chronic appearing interstitial changes and bronchiectasis are again noted in both lower lobes. There is new right lower lobe peribronchial wall thickening. There are new patchy ground-glass opacities in the inferior upper lobes, right middle lobe and minimally in the lower lobes. There is no evidence for pneumothorax. Upper Abdomen: No acute abnormality. Musculoskeletal: No chest wall abnormality. No acute or significant osseous findings. Review of the MIP images confirms the above findings. IMPRESSION: 1. No evidence for pulmonary embolism. 2. New patchy ground-glass opacities in the inferior upper lobes, right middle lobe and minimally in the lower lobes worrisome for multifocal pneumonia. 3. New right lower lobe peribronchial wall thickening worrisome for bronchitis. 4. Stable trace right pleural effusion. 5. Stable chronic interstitial changes and bronchiectasis in the lower lobes. 6. Stable mediastinal lymphadenopathy. New right hilar lymphadenopathy. Aortic Atherosclerosis (ICD10-I70.0) and Emphysema (ICD10-J43.9). Electronically Signed   By: Darliss Cheney M.D.   On: 01/29/2023 17:21        Scheduled Meds:  aspirin EC  81 mg Oral Daily   atorvastatin  40 mg Oral q1800   clopidogrel  75 mg Oral Daily   folic acid  1 mg Oral Daily   lamoTRIgine  100 mg Oral Daily   levothyroxine  100 mcg Oral Q0600   pantoprazole  40 mg Oral Daily   potassium chloride  40 mEq Oral BID    venlafaxine XR  150 mg Oral Daily   Continuous Infusions:  azithromycin 500 mg (01/29/23 2248)   cefTRIAXone (ROCEPHIN)  IV 2 g (01/29/23 2143)   heparin 1,000 Units/hr (01/30/23 0724)     LOS: 3 days        Charise Killian, MD [facility] Pager [phone]  If 7PM-7AM, please contact night-coverage [email or web address] 01/30/2023, 8:31 AM

## 2023-01-30 NOTE — Progress Notes (Signed)
PHARMACIST - PHYSICIAN COMMUNICATION  DR:   Dr Mayford Knife  CONCERNING: IV to Oral Route Change Policy  RECOMMENDATION: This patient is receiving azithromycin by the intravenous route.  Based on criteria approved by the Pharmacy and Therapeutics Committee, the intravenous medication(s) is/are being converted to the equivalent oral dose form(s).   DESCRIPTION: These criteria include: The patient is eating (either orally or via tube) and/or has been taking other orally administered medications for a least 24 hours The patient has no evidence of active gastrointestinal bleeding or impaired GI absorption (gastrectomy, short bowel, patient on TNA or NPO).  If you have questions about this conversion, please contact the Pharmacy Department   Athalia Setterlund Rodriguez-Guzman PharmD, BCPS 01/30/2023 2:39 PM

## 2023-01-31 ENCOUNTER — Inpatient Hospital Stay: Payer: PPO

## 2023-01-31 DIAGNOSIS — J9601 Acute respiratory failure with hypoxia: Secondary | ICD-10-CM | POA: Diagnosis not present

## 2023-01-31 LAB — BASIC METABOLIC PANEL
Anion gap: 8 (ref 5–15)
BUN: 13 mg/dL (ref 8–23)
CO2: 28 mmol/L (ref 22–32)
Calcium: 8.4 mg/dL — ABNORMAL LOW (ref 8.9–10.3)
Chloride: 103 mmol/L (ref 98–111)
Creatinine, Ser: 0.61 mg/dL (ref 0.44–1.00)
GFR, Estimated: >60 mL/min (ref >60)
Glucose, Bld: 120 mg/dL — ABNORMAL HIGH (ref 70–99)
Potassium: 4.3 mmol/L (ref 3.5–5.1)
Sodium: 139 mmol/L (ref 135–145)

## 2023-01-31 LAB — MAGNESIUM: Magnesium: 2.3 mg/dL (ref 1.7–2.4)

## 2023-01-31 NOTE — Evaluation (Signed)
Physical Therapy Evaluation Patient Details Name: [name] MRN: [id number] DOB: 08-Jan-1946 Today's Date: 01/31/2023  History of Present Illness  77 y/o female presented to ED on 01/27/23 for SOB. PMH: depression, anxiety, heart failure, chronic respiratory failure on 2L, RA, memory difficulty, hx of CVA  Clinical Impression  Patient admitted with the above. PTA, patient lives with husband and has 24/7 caregiver who assists due to husband being unable to assist 2/2 back and hernia surgery within the past year. She ambulates with RW at home with assist from caregiver. Patient presents with weakness, impaired balance, and decreased activity tolerance. Required mod-maxA for bed mobility and modA to stand from EOB. Posterior bias throughout mobility despite cues. Taking very small shuffling steps. On 3L O2, spO2 dropping to 85% upon sitting after mobility. Cues for pursed lip breathing with increase to 90%. Patient will benefit from skilled PT services during acute stay to address listed deficits. Patient will benefit from ongoing therapy at discharge to maximize functional independence and safety.         If plan is discharge home, recommend the following: A lot of help with walking and/or transfers;A lot of help with bathing/dressing/bathroom;Assistance with cooking/housework;Help with stairs or ramp for entrance;Assist for transportation   Can travel by private vehicle   No    Equipment Recommendations Rolling Yaslin Kirtley (2 wheels);BSC/3in1;Wheelchair (measurements PT);Wheelchair cushion (measurements PT)  Recommendations for Other Services       Functional Status Assessment Patient has had a recent decline in their functional status and demonstrates the ability to make significant improvements in function in a reasonable and predictable amount of time.     Precautions / Restrictions Precautions Precautions: Fall Precaution Comments: watch O2 Restrictions Weight Bearing Restrictions: No       Mobility  Bed Mobility Overal bed mobility: Needs Assistance Bed Mobility: Supine to Sit, Sit to Supine     Supine to sit: Mod assist Sit to supine: Max assist   General bed mobility comments: assist for trunk and LE management    Transfers Overall transfer level: Needs assistance Equipment used: Rolling Tiawanna Luchsinger (2 wheels) Transfers: Sit to/from Stand Sit to Stand: Mod assist           General transfer comment: assist to stand at EOB. Took very small shuffle steps towards HOB with min-modA and assist to keep RW on ground. Posterior bias throughout standing. On 3L with spO2 dropping to 85% upon sitting after mobility attempt    Ambulation/Gait                  Stairs            Wheelchair Mobility     Tilt Bed    Modified Rankin (Stroke Patients Only)       Balance Overall balance assessment: Needs assistance Sitting-balance support: No upper extremity supported, Feet supported Sitting balance-Leahy Scale: Fair     Standing balance support: Bilateral upper extremity supported, Reliant on assistive device for balance Standing balance-Leahy Scale: Poor                               Pertinent Vitals/Pain Pain Assessment Pain Assessment: Faces Faces Pain Scale: No hurt Pain Intervention(s): Monitored during session    Home Living Family/patient expects to be discharged to:: Private residence Living Arrangements: Spouse/significant other Available Help at Discharge: Family;Personal care attendant;Available 24 hours/day Type of Home: House Home Access: Stairs to enter Entrance Stairs-Rails: Right;Left;Can  reach both Entrance Stairs-Number of Steps: 2   Home Layout: Two level;Other (Comment) (patient lives on main level) Home Equipment: Agricultural consultant (2 wheels) Additional Comments: husband unable to assist patient since his back and hernia surgery within the past year    Prior Function Prior Level of Function : Needs  assist             Mobility Comments: uses RW for mobility. Aide assists with standing and ambulation ADLs Comments: aide assists with all ADLs     Extremity/Trunk Assessment   Upper Extremity Assessment Upper Extremity Assessment: Generalized weakness    Lower Extremity Assessment Lower Extremity Assessment: Generalized weakness    Cervical / Trunk Assessment Cervical / Trunk Assessment: Kyphotic  Communication   Communication Communication: No apparent difficulties  Cognition Arousal: Alert Behavior During Therapy: Flat affect Overall Cognitive Status: Within Functional Limits for tasks assessed                                          General Comments      Exercises     Assessment/Plan    PT Assessment Patient needs continued PT services  PT Problem List Decreased strength;Decreased activity tolerance;Decreased balance;Decreased mobility;Decreased knowledge of use of DME;Decreased safety awareness;Decreased knowledge of precautions;Cardiopulmonary status limiting activity       PT Treatment Interventions DME instruction;Gait training;Functional mobility training;Therapeutic activities;Therapeutic exercise;Balance training;Stair training;Patient/family education    PT Goals (Current goals can be found in the Care Plan section)  Acute Rehab PT Goals Patient Stated Goal: to go home PT Goal Formulation: With patient/family Time For Goal Achievement: 02/14/23 Potential to Achieve Goals: Fair    Frequency Min 1X/week     Co-evaluation               AM-PAC PT "6 Clicks" Mobility  Outcome Measure Help needed turning from your back to your side while in a flat bed without using bedrails?: A Lot Help needed moving from lying on your back to sitting on the side of a flat bed without using bedrails?: A Lot Help needed moving to and from a bed to a chair (including a wheelchair)?: A Lot Help needed standing up from a chair using your arms  (e.g., wheelchair or bedside chair)?: A Lot Help needed to walk in hospital room?: A Lot Help needed climbing 3-5 steps with a railing? : Total 6 Click Score: 11    End of Session Equipment Utilized During Treatment: Oxygen Activity Tolerance: Patient limited by fatigue Patient left: in bed;with call bell/phone within reach;with bed alarm set Nurse Communication: Mobility status PT Visit Diagnosis: Unsteadiness on feet (R26.81);Muscle weakness (generalized) (M62.81);Difficulty in walking, not elsewhere classified (R26.2)    Time: 4098-1191 PT Time Calculation (min) (ACUTE ONLY): 20 min   Charges:   PT Evaluation $PT Eval Moderate Complexity: 1 Mod   PT General Charges $$ ACUTE PT VISIT: 1 Visit         Maylon Peppers, PT, DPT Physical Therapist - [facility]  [facility]   Daymion Nazaire A Raynard Mapps 01/31/2023, 1:04 PM

## 2023-01-31 NOTE — Evaluation (Signed)
Occupational Therapy Evaluation Patient Details Name: [name] MRN: [id number] DOB: [birth year] Today's Date: 01/31/2023   History of Present Illness 77 y/o female presented to ED on 01/27/23 for SOB. PMH: depression, anxiety, heart failure, chronic respiratory failure on 2L, RA, memory difficulty, hx of CVA   Clinical Impression   Pt was seen for OT evaluation this date. Prior to hospital admission, pt was living at home with her husband with 24/7 assistance d/t husband having recent surgery and unable to physically assist. Pt needs assist with mobility using RW and assist with most ADLs. Reports a lot of spillage with self feeding tasks even after trialing large grip bent utensils. Set up/supervision with UB ADLs.  Pt presents to acute OT demonstrating impaired ADL performance and functional mobility 2/2 weakness, balance deficits and limited activity tolerance (See OT problem list for additional functional deficits). Pt currently requires Mod to Max A for bed mobility. Min/Mod A for STS from EOB with it elevated and able to take forward steps to sink for grooming. Pt performed oral care and washing face with Min A as washcloth was prepped and toothpaste placed on toothbrush. Min/mod A provided to maintain standing balance at sink with unilateral support on sink at all times. Pt urinated and returned to bed. Provided max/total assist for LB bathing and dressing to change mesh panties. NT called in to replace pure wick and reposition pt. 02 89% at lowest on 3L throughout evaluation. Pt would benefit from skilled OT services to address noted impairments and functional limitations (see below for any additional details) in order to maximize safety and independence while minimizing falls risk and caregiver burden. Do anticipate the need for follow up OT services upon acute hospital DC, recommend SNF placement.        If plan is discharge home, recommend the following: A lot of help with walking  and/or transfers;A lot of help with bathing/dressing/bathroom;Assistance with cooking/housework;Help with stairs or ramp for entrance;Assist for transportation    Functional Status Assessment  Patient has had a recent decline in their functional status and demonstrates the ability to make significant improvements in function in a reasonable and predictable amount of time.  Equipment Recommendations  None recommended by OT    Recommendations for Other Services       Precautions / Restrictions Precautions Precautions: Fall Precaution Comments: watch O2 Restrictions Weight Bearing Restrictions: No      Mobility Bed Mobility Overal bed mobility: Needs Assistance Bed Mobility: Supine to Sit, Sit to Supine     Supine to sit: Mod assist, Used rails, HOB elevated Sit to supine: Max assist, Used rails   General bed mobility comments: assistance needed for trunk and BLE management and hand placement on bed rails    Transfers Overall transfer level: Needs assistance Equipment used: Rolling walker (2 wheels) Transfers: Sit to/from Stand Sit to Stand: From elevated surface, Min assist, Mod assist           General transfer comment: Min/Mod A for STS from EOB with it elevated as pt reports she has a lift chair and spouse states she raises it up to stand at home; posterior bias and needed Min/Mod A to step forward to sink and backward to bed and to maintain balance      Balance Overall balance assessment: Needs assistance Sitting-balance support: No upper extremity supported, Feet supported Sitting balance-Leahy Scale: Fair Sitting balance - Comments: initially needs assist, but able to sit with CGA/close SBA and UE support on  bed   Standing balance support: Bilateral upper extremity supported, Reliant on assistive device for balance Standing balance-Leahy Scale: Poor Standing balance comment: needs Min/Mod A to maintain balance in standing and support externally through sink and  RW                           ADL either performed or assessed with clinical judgement   ADL Overall ADL's : Needs assistance/impaired     Grooming: Oral care;Wash/dry face;Standing Grooming Details (indicate cue type and reason): standing at sink with Min A to maintain balance with BUE or unilateral support on sink at all times and RW     Lower Body Bathing: Maximal assistance;Bed level       Lower Body Dressing: Maximal assistance;Bed level                 General ADL Comments: pt weak, limited endurance/standing tolerance; had assist with mobility at home and other ADLs; urinated and needed assist to be cleaned and changed in bed     Vision         Perception         Praxis         Pertinent Vitals/Pain Pain Assessment Pain Assessment: No/denies pain Pain Intervention(s): Monitored during session     Extremity/Trunk Assessment Upper Extremity Assessment Upper Extremity Assessment: Generalized weakness   Lower Extremity Assessment Lower Extremity Assessment: Generalized weakness   Cervical / Trunk Assessment Cervical / Trunk Assessment: Kyphotic   Communication Communication Communication: No apparent difficulties   Cognition Arousal: Alert Behavior During Therapy: Flat affect Overall Cognitive Status: Within Functional Limits for tasks assessed                                       General Comments  02 remained 89% or higher with activity throughout on 3L    Exercises     Shoulder Instructions      Home Living Family/patient expects to be discharged to:: Private residence Living Arrangements: Spouse/significant other Available Help at Discharge: Family;Personal care attendant;Available 24 hours/day Type of Home: House Home Access: Stairs to enter [employer] of Steps: 2 Entrance Stairs-Rails: Right;Left;Can reach both Home Layout: Two level;Other (Comment) (patient lives on main level)                Home Equipment: Agricultural consultant (2 wheels)   Additional Comments: husband unable to assist patient since his back and hernia surgery within the past year      Prior Functioning/Environment Prior Level of Function : Needs assist             Mobility Comments: uses RW for mobility. Aide assists with standing and ambulation ADLs Comments: aide assists with all ADLs        OT Problem List: Decreased strength;Decreased activity tolerance;Impaired balance (sitting and/or standing)      OT Treatment/Interventions: Self-care/ADL training;Therapeutic exercise;Patient/family education;Balance training;Therapeutic activities;DME and/or AE instruction;Energy conservation    OT Goals(Current goals can be found in the care plan section) Acute Rehab OT Goals Patient Stated Goal: improve strength OT Goal Formulation: With patient/family Time For Goal Achievement: 02/14/23 Potential to Achieve Goals: Good ADL Goals Pt Will Perform Grooming: standing;with contact guard assist Pt Will Perform Upper Body Bathing: sitting;with supervision Pt Will Perform Upper Body Dressing: with supervision;sitting Pt Will Transfer to Toilet: with min assist;with contact  guard assist;ambulating;regular height toilet;grab bars  OT Frequency: Min 1X/week    Co-evaluation              AM-PAC OT "6 Clicks" Daily Activity     Outcome Measure Help from another person eating meals?: A Little Help from another person taking care of personal grooming?: A Little Help from another person toileting, which includes using toliet, bedpan, or urinal?: A Lot Help from another person bathing (including washing, rinsing, drying)?: A Lot Help from another person to put on and taking off regular upper body clothing?: A Lot Help from another person to put on and taking off regular lower body clothing?: A Lot 6 Click Score: 14   End of Session Equipment Utilized During Treatment: Rolling walker (2  wheels);Oxygen Nurse Communication: Mobility status  Activity Tolerance: Patient tolerated treatment well Patient left: in bed;with call bell/phone within reach;with nursing/sitter in room  OT Visit Diagnosis: Other abnormalities of gait and mobility (R26.89);Unsteadiness on feet (R26.81);History of falling (Z91.81);Muscle weakness (generalized) (M62.81)                Time: 1914-7829 OT Time Calculation (min): 36 min Charges:  OT General Charges $OT Visit: 1 Visit OT Evaluation $OT Eval Moderate Complexity: 1 Mod OT Treatments $Self Care/Home Management : 8-22 mins Zoila Ditullio, OTR/L 01/31/23, 4:05 PM  Georgann Bramble E Virgen Belland 01/31/2023, 3:58 PM

## 2023-01-31 NOTE — [place] FL2 (Signed)
[place] MEDICAID FL2 LEVEL OF CARE FORM     IDENTIFICATION  Patient Name: [name] Birthdate: Aug 27, 1945 Sex: female Admission Date (Current Location): 01/27/2023  [facility] and [place] Number:  Chiropodist and Address:  [facility], [address] [place], [place] [address]      Provider Number: [id number]  Attending Physician Name and Address:  Charise Killian, MD  Relative Name and Phone Number:  Riley Lam (spouse)  [phone]    Current Level of Care: Hospital Recommended Level of Care: Skilled Nursing Facility Prior Approval Number:    Date Approved/Denied:   PASRR Number: [id number]  Discharge Plan: SNF    Current Diagnoses: Patient Active Problem List   Diagnosis Date Noted   Acute hypoxemic respiratory failure (HCC) 01/27/2023   Meningioma (HCC) 09/15/2020   Chronic diastolic CHF (congestive heart failure) (HCC) 09/15/2020   Memory difficulty 08/31/2020   History of stroke 08/31/2020   Falls frequently 08/31/2020   Ataxic dysarthria 08/31/2020   Acute CHF (congestive heart failure) (HCC) 07/15/2020   Hypertension    Anterior epistaxis    Senile purpura (HCC) 06/15/2020   Ganglion of joint 06/12/2020   Hyperlipidemia 06/12/2020   Hypothyroidism due to acquired atrophy of thyroid 06/12/2020   Recurrent major depressive disorder, in partial remission (HCC) 06/12/2020   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/12/2020   Sleep apnea 06/12/2020   Elevated troponin 02/06/2020   Chronic respiratory failure with hypoxia (HCC) 12/22/2018   Immunosuppression (HCC) 12/22/2018   Pulmonary fibrosis (HCC) 12/21/2018   Bilateral carotid artery disease (HCC) 07/31/2016   Acute CVA (cerebrovascular accident) (HCC) 07/17/2016   Other abnormal glucose 11/25/2013   Prediabetes 11/25/2013   Rheumatoid arthritis with rheumatoid factor (HCC) 09/20/2013    Orientation RESPIRATION BLADDER Height & Weight     Self, Time, Situation,  Place  O2 (3L nasal cannula) Incontinent, External catheter Weight: 145 lb 11.2 oz (66.1 kg) Height:  5\' 7"  (170.2 cm)  BEHAVIORAL SYMPTOMS/MOOD NEUROLOGICAL BOWEL NUTRITION STATUS      Continent Diet (see discharge summary)  AMBULATORY STATUS COMMUNICATION OF NEEDS Skin   Extensive Assist Verbally Normal                       Personal Care Assistance Level of Assistance  Bathing, Dressing, Total care, Feeding Bathing Assistance: Limited assistance Feeding assistance: Limited assistance Dressing Assistance: Maximum assistance Total Care Assistance: Maximum assistance   Functional Limitations Info  Sight, Hearing, Speech Sight Info: Adequate Hearing Info: Adequate Speech Info: Adequate    SPECIAL CARE FACTORS FREQUENCY  PT (By licensed PT), OT (By licensed OT)     PT Frequency: min 4x weekly OT Frequency: min 4x weekly            Contractures Contractures Info: Not present    Additional Factors Info  Code Status, Allergies Code Status Info: full Allergies Info: codeine, demerol (meperidine)           Current Medications (01/31/2023):  This is the current hospital active medication list Current Facility-Administered Medications  Medication Dose Route Frequency Provider Last Rate Last Admin   acetaminophen (TYLENOL) tablet 650 mg  650 mg Oral Q6H PRN Cox, Amy N, DO   650 mg at 01/30/23 2154   Or   acetaminophen (TYLENOL) suppository 650 mg  650 mg Rectal Q6H PRN Cox, Amy N, DO       aspirin EC tablet 81 mg  81 mg Oral Daily Cox, Amy N, DO  81 mg at 01/31/23 0915   atorvastatin (LIPITOR) tablet 40 mg  40 mg Oral q1800 Cox, Amy N, DO   40 mg at 01/30/23 1715   azithromycin (ZITHROMAX) tablet 500 mg  500 mg Oral Daily Charise Killian, MD   500 mg at 01/30/23 1532   clopidogrel (PLAVIX) tablet 75 mg  75 mg Oral Daily Cox, Amy N, DO   75 mg at 01/31/23 0915   dexamethasone (DECADRON) injection 4 mg  4 mg Intravenous Q24H Vida Rigger, MD   4 mg at 01/31/23  1333   enoxaparin (LOVENOX) injection 40 mg  40 mg Subcutaneous Q24H Charise Killian, MD   40 mg at 01/30/23 1715   folic acid (FOLVITE) tablet 1 mg  1 mg Oral Daily Cox, Amy N, DO   1 mg at 01/31/23 0915   hydrALAZINE (APRESOLINE) injection 5 mg  5 mg Intravenous Q8H PRN Cox, Amy N, DO       ipratropium-albuterol (DUONEB) 0.5-2.5 (3) MG/3ML nebulizer solution 3 mL  3 mL Nebulization Q4H PRN Andris Baumann, MD   3 mL at 01/31/23 0753   lamoTRIgine (LAMICTAL) tablet 100 mg  100 mg Oral Daily Cox, Amy N, DO   100 mg at 01/31/23 0915   levothyroxine (SYNTHROID) tablet 100 mcg  100 mcg Oral Q0600 Cox, Amy N, DO   100 mcg at 01/31/23 0537   ondansetron (ZOFRAN) tablet 4 mg  4 mg Oral Q6H PRN Cox, Amy N, DO       Or   ondansetron (ZOFRAN) injection 4 mg  4 mg Intravenous Q6H PRN Cox, Amy N, DO       pantoprazole (PROTONIX) EC tablet 40 mg  40 mg Oral Daily Cox, Amy N, DO   40 mg at 01/31/23 0915   senna-docusate (Senokot-S) tablet 1 tablet  1 tablet Oral QHS PRN Cox, Amy N, DO       spironolactone (ALDACTONE) tablet 50 mg  50 mg Oral Daily Vida Rigger, MD   50 mg at 01/31/23 0915   venlafaxine XR (EFFEXOR-XR) 24 hr capsule 150 mg  150 mg Oral Daily Cox, Amy N, DO   150 mg at 01/31/23 0915     Discharge Medications: Please see discharge summary for a list of discharge medications.  Relevant Imaging Results:  Relevant Lab Results:   Additional Information SSN: [id number]  Darolyn Rua, LCSW

## 2023-01-31 NOTE — Progress Notes (Signed)
PULMONOLOGY         Date: 01/31/2023,   MRN# [id number] [name] Aug 09, 1945     AdmissionWeight: 66.1 kg                 CurrentWeight: 66.1 kg  Referring provider: Dr Joylene Igo   CHIEF COMPLAINT:   Acute on chronic hypoxemic respiratory failure   HISTORY OF PRESENT ILLNESS    [name] is a 77 year old female with history of hypothyroid, anxiety, hyperlipidemia, hypertension, GERD, depression, history of heart failure with preserved ejection fraction, chronic respiratory failure on 2 L nasal cannula at baseline, rheumatoid arthritis, memory difficulty, history of stroke, who presents emergency department for chief concerns of shortness of breath.She requied 10L/min HFNC.  She had hypokalemia on BMP.  She had elevated BNP. She had CTPE with no PE and findig of PNA.  PCCM for further evaluation and management.    01/31/23- patient improved , now on 3L/min.  She's lucid awake alert.  Lung exam is better.  She urinated and diuresed well overnight.  CRP is markedly elevated due to Acute exacerbation of COPD.  She is on dexamethasone and is improved.   PAST MEDICAL HISTORY   Past Medical History:  Diagnosis Date   Collagen vascular disease (HCC)    Depression    Hyperlipidemia    Muscle pain    Neuropathy    RA (rheumatoid arthritis) (HCC)    Thyroid disease    Vitamin B 12 deficiency      SURGICAL HISTORY   Past Surgical History:  Procedure Laterality Date   ABDOMINAL HYSTERECTOMY     BREAST BIOPSY Left    2 benign biopsies   ECTOPIC PREGNANCY SURGERY     KNEE SURGERY Right    LAMINECTOMY     leg vein stripping Bilateral    TONSILLECTOMY       FAMILY HISTORY   Family History  Problem Relation Age of Onset   COPD Father    Cancer Father    Breast cancer Cousin    Breast cancer Maternal Aunt    Breast cancer Maternal Aunt      SOCIAL HISTORY   Social History   Tobacco Use   Smoking status: Never    Passive exposure: Never    Smokeless tobacco: Never     MEDICATIONS    Home Medication:    Current Medication:  Current Facility-Administered Medications:    acetaminophen (TYLENOL) tablet 650 mg, 650 mg, Oral, Q6H PRN, 650 mg at 01/30/23 2154 **OR** acetaminophen (TYLENOL) suppository 650 mg, 650 mg, Rectal, Q6H PRN, Cox, Amy N, DO   aspirin EC tablet 81 mg, 81 mg, Oral, Daily, Cox, Amy N, DO, 81 mg at 01/30/23 0915   atorvastatin (LIPITOR) tablet 40 mg, 40 mg, Oral, q1800, Cox, Amy N, DO, 40 mg at 01/30/23 1715   azithromycin (ZITHROMAX) tablet 500 mg, 500 mg, Oral, Daily, Mayford Knife, Jamiese M, MD, 500 mg at 01/30/23 1532   clopidogrel (PLAVIX) tablet 75 mg, 75 mg, Oral, Daily, Cox, Amy N, DO, 75 mg at 01/30/23 0915   dexamethasone (DECADRON) injection 4 mg, 4 mg, Intravenous, Q24H, Ava Tangney, MD, 4 mg at 01/30/23 1532   enoxaparin (LOVENOX) injection 40 mg, 40 mg, Subcutaneous, Q24H, Mayford Knife, Jamiese M, MD, 40 mg at 01/30/23 1715   folic acid (FOLVITE) tablet 1 mg, 1 mg, Oral, Daily, Cox, Amy N, DO, 1 mg at 01/30/23 0915   hydrALAZINE (APRESOLINE) injection 5 mg, 5 mg, Intravenous,  Q8H PRN, Cox, Amy N, DO   ipratropium-albuterol (DUONEB) 0.5-2.5 (3) MG/3ML nebulizer solution 3 mL, 3 mL, Nebulization, Q4H PRN, Andris Baumann, MD, 3 mL at 01/31/23 0753   lamoTRIgine (LAMICTAL) tablet 100 mg, 100 mg, Oral, Daily, Cox, Amy N, DO, 100 mg at 01/30/23 0915   levothyroxine (SYNTHROID) tablet 100 mcg, 100 mcg, Oral, Q0600, Cox, Amy N, DO, 100 mcg at 01/31/23 0537   ondansetron (ZOFRAN) tablet 4 mg, 4 mg, Oral, Q6H PRN **OR** ondansetron (ZOFRAN) injection 4 mg, 4 mg, Intravenous, Q6H PRN, Cox, Amy N, DO   pantoprazole (PROTONIX) EC tablet 40 mg, 40 mg, Oral, Daily, Cox, Amy N, DO, 40 mg at 01/30/23 0915   senna-docusate (Senokot-S) tablet 1 tablet, 1 tablet, Oral, QHS PRN, Cox, Amy N, DO   spironolactone (ALDACTONE) tablet 50 mg, 50 mg, Oral, Daily, Karna Christmas, Orey Moure, MD, 50 mg at 01/30/23 1532   venlafaxine XR  (EFFEXOR-XR) 24 hr capsule 150 mg, 150 mg, Oral, Daily, Cox, Amy N, DO, 150 mg at 01/30/23 0915    ALLERGIES   Codeine and Demerol [meperidine]     REVIEW OF SYSTEMS    Review of Systems:  Gen:  Denies  fever, sweats, chills weigh loss  HEENT: Denies blurred vision, double vision, ear pain, eye pain, hearing loss, nose bleeds, sore throat Cardiac:  No dizziness, chest pain or heaviness, chest tightness,edema Resp:   reports dyspnea chronically  Gi: Denies swallowing difficulty, stomach pain, nausea or vomiting, diarrhea, constipation, bowel incontinence Gu:  Denies bladder incontinence, burning urine Ext:   Denies Joint pain, stiffness or swelling Skin: Denies  skin rash, easy bruising or bleeding or hives Endoc:  Denies polyuria, polydipsia , polyphagia or weight change Psych:   Denies depression, insomnia or hallucinations   Other:  All other systems negative   VS: BP (!) 134/47 (BP Location: Right Arm)   Pulse 70   Temp 97.8 F (36.6 C) (Oral)   Resp 18   Ht 5\' 7"  (1.702 m)   Wt 66.1 kg   SpO2 95%   BMI 22.82 kg/m      PHYSICAL EXAM    GENERAL:NAD, no fevers, chills, no weakness no fatigue HEAD: Normocephalic, atraumatic.  EYES: Pupils equal, round, reactive to light. Extraocular muscles intact. No scleral icterus.  MOUTH: Moist mucosal membrane. Dentition intact. No abscess noted.  EAR, NOSE, THROAT: Clear without exudates. No external lesions.  NECK: Supple. No thyromegaly. No nodules. No JVD.  PULMONARY: decreased breath sounds with mild rhonchi worse at bases bilaterally.  CARDIOVASCULAR: S1 and S2. Regular rate and rhythm. No murmurs, rubs, or gallops. No edema. Pedal pulses 2+ bilaterally.  GASTROINTESTINAL: Soft, nontender, nondistended. No masses. Positive bowel sounds. No hepatosplenomegaly.  MUSCULOSKELETAL: No swelling, clubbing, or edema. Range of motion full in all extremities.  NEUROLOGIC: Cranial nerves II through XII are intact. No gross  focal neurological deficits. Sensation intact. Reflexes intact.  SKIN: No ulceration, lesions, rashes, or cyanosis. Skin warm and dry. Turgor intact.  PSYCHIATRIC: Mood, affect within normal limits. The patient is awake, alert and oriented x 3. Insight, judgment intact.       IMAGING    Narrative & Impression  CLINICAL DATA:  High probability for PE. History of pulmonary fibrosis and emphysema.   EXAM: CT ANGIOGRAPHY CHEST WITH CONTRAST   TECHNIQUE: Multidetector CT imaging of the chest was performed using the standard protocol during bolus administration of intravenous contrast. Multiplanar CT image reconstructions and MIPs were obtained to evaluate the vascular  anatomy.   RADIATION DOSE REDUCTION: This exam was performed according to the departmental dose-optimization program which includes automated exposure control, adjustment of the mA and/or kV according to patient size and/or use of iterative reconstruction technique.   CONTRAST:  75mL OMNIPAQUE IOHEXOL 350 MG/ML SOLN   COMPARISON:  Chest CT 09/23/2018   FINDINGS: Cardiovascular: Heart is enlarged. Aorta is normal in size. There are atherosclerotic calcifications of the aorta. There is adequate opacification of the pulmonary arteries to the segmental level. There is no evidence for pulmonary embolism.   Mediastinum/Nodes: There is an enlarged precarinal lymph node measuring 12 mm, unchanged. There is an enlarged right hilar lymph node measuring 11 mm, new from prior. Visualized esophagus and thyroid gland are within normal limits.   Lungs/Pleura: There is a trace right pleural effusion similar to prior. Severe emphysema again noted. Chronic appearing interstitial changes and bronchiectasis are again noted in both lower lobes. There is new right lower lobe peribronchial wall thickening. There are new patchy ground-glass opacities in the inferior upper lobes, right middle lobe and minimally in the lower lobes.  There is no evidence for pneumothorax.   Upper Abdomen: No acute abnormality.   Musculoskeletal: No chest wall abnormality. No acute or significant osseous findings.   Review of the MIP images confirms the above findings.   IMPRESSION: 1. No evidence for pulmonary embolism. 2. New patchy ground-glass opacities in the inferior upper lobes, right middle lobe and minimally in the lower lobes worrisome for multifocal pneumonia. 3. New right lower lobe peribronchial wall thickening worrisome for bronchitis. 4. Stable trace right pleural effusion. 5. Stable chronic interstitial changes and bronchiectasis in the lower lobes. 6. Stable mediastinal lymphadenopathy. New right hilar lymphadenopathy.   Aortic Atherosclerosis (ICD10-I70.0) and Emphysema (ICD10-J43.9).     Electronically Signed   By: Darliss Cheney M.D.    ASSESSMENT/PLAN   Acute on chronic hypoxemic respiratory failure Patient is currently improved on 4L/min [place] -due to acute exacerbation of COPD with RA related pulmonary fibrosis -RVP is negative but she seems to have pneumonia  - may be false negative COVID strain -treating with dexamethasone 4mg  IV due to elevated crp with hx of RA -nebulizer therapy  -IS and flutter valve for chest PT Metaneb -OOB to chair    Combined pulmonary fibrosis and emphysema (CPFE) - continue therapy as above   Thank you for allowing me to participate in the care of this patient.   Patient/Family are satisfied with care plan and all questions have been answered.    Provider disclosure: Patient with at least one acute or chronic illness or injury that poses a threat to life or bodily function and is being managed actively during this encounter.  All of the below services have been performed independently by signing provider:  review of prior documentation from internal and or external health records.  Review of previous and current lab results.  Interview and comprehensive  assessment during patient visit today. Review of current and previous chest radiographs/CT scans. Discussion of management and test interpretation with health care team and patient/family.   This document was prepared using Dragon voice recognition software and may include unintentional dictation errors.     Vida Rigger, M.D.  Division of Pulmonary & Critical Care Medicine

## 2023-01-31 NOTE — Progress Notes (Signed)
PROGRESS NOTE    [name]  ZOX:[id number] DOB: March 07, 1946 DOA: 01/27/2023 PCP: Lynnea Ferrier, MD   Assessment & Plan:   Principal Problem:   Acute hypoxemic respiratory failure (HCC) Active Problems:   Rheumatoid arthritis with rheumatoid factor (HCC)   Elevated troponin   Hyperlipidemia   Hypothyroidism due to acquired atrophy of thyroid   Immunosuppression (HCC)   Pulmonary fibrosis (HCC)   Recurrent major depressive disorder, in partial remission (HCC)   Sleep apnea   Hypertension   History of stroke  Assessment and Plan: Acute on chronic hypoxic respiratory failure: likely secondary to CAP. Continue on azithromycin, bronchodilators & encourage incentive spirometry. Continue on IV steroids as per pulmon. D-dimer was elevated. CTA chest shows multifocal pneumonia but no PE. CRP was elevated, resp viral panel was neg. Pulmon following and recs apprec. Hx of pulmon fibrosis, emphysema & bronchiectasis. Continue on supplemental oxygen and wean back to baseline as tolerated, currently on 3L [place]. Uses 2L [place] at home  CAP: continue on azithromycin, dexamethasone, bronchodilators & encourage incentive spirometry as per pulmon. Pulmon following and recs apprec   Possible acute on chronic diastolic CHF: s/p IV lasix x 3. Monitor I/Os    Sleep apnea: does not use CPAP    Recurrent major depressive disorder: in partial remission. Continue on home dose of venlafaxine   Hypothyroidism: continue on levothyroxine     HLD: continue on statin    Elevated troponin: likely secondary to demand ischemia. No chest pain. Trending down now. D/c IV heparin drip. CTA chest was neg for PE   Rheumatoid arthritis: on methotrexate weekly         DVT prophylaxis: lovenox Code Status: full  Family Communication: discussed pt's care w/ pt's family at bedside and answered their questions  Disposition Plan: possibly d/c to SNF   Level of care: Telemetry Cardiac  Status is:  Inpatient Remains inpatient appropriate because: severity of illness    Consultants:  Pulmon   Procedures:   Antimicrobials:  Subjective: Pt c/o fatigue   Objective: Vitals:   01/31/23 0335 01/31/23 0753 01/31/23 0814 01/31/23 1150  BP: 123/65  (!) 134/47 138/76  Pulse: (!) 58  70 84  Resp: 18  18 16   Temp: (!) 97.5 F (36.4 C)  97.8 F (36.6 C) 97.9 F (36.6 C)  TempSrc: Oral  Oral Oral  SpO2: 97% 95% 95% 96%  Weight:      Height:        Intake/Output Summary (Last 24 hours) at 01/31/2023 1420 Last data filed at 01/31/2023 0041 Gross per 24 hour  Intake 240 ml  Output 250 ml  Net -10 ml   Filed Weights   01/27/23 1911  Weight: 66.1 kg    Examination:  General exam: Appears calm & comfortable  Respiratory system: decreased breath sounds b/l  Cardiovascular system: S1 & S2+. No rubs or clicks   Gastrointestinal system: abd is soft, NT, ND & hypoactive bowel sounds Central nervous system: awake & alert.  Psychiatry: judgement & insight appears improved. Flat mood and affect    Data Reviewed: I have personally reviewed following labs and imaging studies  CBC: Recent Labs  Lab 01/27/23 1915 01/28/23 0500 01/29/23 0535 01/30/23 0431  WBC 11.6* 4.9 6.1 5.2  NEUTROABS 6.6  --   --   --   HGB 10.8* 9.6* 9.7* 10.2*  HCT 33.3* 29.7* 30.5* 30.9*  MCV 105.4* 104.9* 106.3* 101.6*  PLT 264 201 248 255  Basic Metabolic Panel: Recent Labs  Lab 01/27/23 1915 01/28/23 0025 01/28/23 0500 01/29/23 0535 01/30/23 0431 01/31/23 0441  NA 136  --  138 139 137 139  K 3.8  --  3.4* 3.5 3.0* 4.3  CL 103  --  105 106 102 103  CO2 21*  --  25 26 27 28   GLUCOSE 198*  --  160* 92 89 120*  BUN 12  --  17 29* 14 13  CREATININE 0.93  --  0.99 1.06* 0.67 0.61  CALCIUM 8.5*  --  7.8* 7.9* 8.1* 8.4*  MG  --  1.8  --  2.2 2.0 2.3   GFR: Estimated Creatinine Clearance: 56.4 mL/min (by C-G formula based on SCr of 0.61 mg/dL). Liver Function Tests: Recent Labs  Lab  01/27/23 1915  AST 24  ALT 16  ALKPHOS 76  BILITOT 1.4*  PROT 7.4  ALBUMIN 3.6   No results for input(s): "LIPASE", "AMYLASE" in the last 168 hours. No results for input(s): "AMMONIA" in the last 168 hours. Coagulation Profile: Recent Labs  Lab 01/28/23 0025  INR 1.2   Cardiac Enzymes: No results for input(s): "CKTOTAL", "CKMB", "CKMBINDEX", "TROPONINI" in the last 168 hours. BNP (last 3 results) No results for input(s): "PROBNP" in the last 8760 hours. HbA1C: No results for input(s): "HGBA1C" in the last 72 hours. CBG: No results for input(s): "GLUCAP" in the last 168 hours. Lipid Profile: No results for input(s): "CHOL", "HDL", "LDLCALC", "TRIG", "CHOLHDL", "LDLDIRECT" in the last 72 hours. Thyroid Function Tests: No results for input(s): "TSH", "T4TOTAL", "FREET4", "T3FREE", "THYROIDAB" in the last 72 hours. Anemia Panel: No results for input(s): "VITAMINB12", "FOLATE", "FERRITIN", "TIBC", "IRON", "RETICCTPCT" in the last 72 hours. Sepsis Labs: Recent Labs  Lab 01/27/23 2258 01/28/23 0025 01/28/23 0031 01/28/23 0500 01/29/23 0535  PROCALCITON  --  5.71  --  8.05 8.86  LATICACIDVEN 1.9  --  2.0*  --   --     Recent Results (from the past 240 hour(s))  Blood Culture (routine x 2)     Status: None (Preliminary result)   Collection Time: 01/27/23  7:22 PM   Specimen: BLOOD  Result Value Ref Range Status   Specimen Description BLOOD RIGHT ASSIST CONTROL  Final   Special Requests   Final    BOTTLES DRAWN AEROBIC AND ANAEROBIC Blood Culture results may not be optimal due to an excessive volume of blood received in culture bottles   Culture   Final    NO GROWTH 4 DAYS Performed at [facility], [address], [place], [place] [address]    Report Status PENDING  Incomplete  Blood Culture (routine x 2)     Status: None (Preliminary result)   Collection Time: 01/27/23 10:58 PM   Specimen: BLOOD  Result Value Ref Range Status   Specimen Description BLOOD  BLOOD RIGHT ARM  Final   Special Requests   Final    BOTTLES DRAWN AEROBIC AND ANAEROBIC Blood Culture adequate volume   Culture   Final    NO GROWTH 4 DAYS Performed at [facility], [address], [place], [place] [address]    Report Status PENDING  Incomplete  Resp panel by RT-PCR (RSV, Flu A&B, Covid) Anterior Nasal Swab     Status: None   Collection Time: 01/28/23 12:25 AM   Specimen: Anterior Nasal Swab  Result Value Ref Range Status   SARS Coronavirus 2 by RT PCR NEGATIVE NEGATIVE Final    Comment: (NOTE) SARS-CoV-2  target nucleic acids are NOT DETECTED.  The SARS-CoV-2 RNA is generally detectable in upper respiratory specimens during the acute phase of infection. The lowest concentration of SARS-CoV-2 viral copies this assay can detect is 138 copies/mL. A negative result does not preclude SARS-Cov-2 infection and should not be used as the sole basis for treatment or other patient management decisions. A negative result may occur with  improper specimen collection/handling, submission of specimen other than nasopharyngeal swab, presence of viral mutation(s) within the areas targeted by this assay, and inadequate number of viral copies(<138 copies/mL). A negative result must be combined with clinical observations, patient history, and epidemiological information. The expected result is Negative.  Fact Sheet for Patients:  BloggerCourse.com  Fact Sheet for Healthcare Providers:  SeriousBroker.it  This test is no t yet approved or cleared by the [place] FDA and  has been authorized for detection and/or diagnosis of SARS-CoV-2 by FDA under an Emergency Use Authorization (EUA). This EUA will remain  in effect (meaning this test can be used) for the duration of the COVID-19 declaration under Section 564(b)(1) of the Act, 21 U.S.C.section 360bbb-3(b)(1), unless the authorization is terminated  or revoked  sooner.       Influenza A by PCR NEGATIVE NEGATIVE Final   Influenza B by PCR NEGATIVE NEGATIVE Final    Comment: (NOTE) The Xpert Xpress SARS-CoV-2/FLU/RSV plus assay is intended as an aid in the diagnosis of influenza from Nasopharyngeal swab specimens and should not be used as a sole basis for treatment. Nasal washings and aspirates are unacceptable for Xpert Xpress SARS-CoV-2/FLU/RSV testing.  Fact Sheet for Patients: BloggerCourse.com  Fact Sheet for Healthcare Providers: SeriousBroker.it  This test is not yet approved or cleared by the [place] FDA and has been authorized for detection and/or diagnosis of SARS-CoV-2 by FDA under an Emergency Use Authorization (EUA). This EUA will remain in effect (meaning this test can be used) for the duration of the COVID-19 declaration under Section 564(b)(1) of the Act, 21 U.S.C. section 360bbb-3(b)(1), unless the authorization is terminated or revoked.     Resp Syncytial Virus by PCR NEGATIVE NEGATIVE Final    Comment: (NOTE) Fact Sheet for Patients: BloggerCourse.com  Fact Sheet for Healthcare Providers: SeriousBroker.it  This test is not yet approved or cleared by the [place] FDA and has been authorized for detection and/or diagnosis of SARS-CoV-2 by FDA under an Emergency Use Authorization (EUA). This EUA will remain in effect (meaning this test can be used) for the duration of the COVID-19 declaration under Section 564(b)(1) of the Act, 21 U.S.C. section 360bbb-3(b)(1), unless the authorization is terminated or revoked.  Performed at [facility], [address] [place], [place] [address]   Respiratory (~20 pathogens) panel by PCR     Status: None   Collection Time: 01/29/23  2:44 PM   Specimen: Nasopharyngeal Swab; Respiratory  Result Value Ref Range Status   Adenovirus NOT DETECTED NOT DETECTED  Final   Coronavirus 229E NOT DETECTED NOT DETECTED Final    Comment: (NOTE) The Coronavirus on the Respiratory Panel, DOES NOT test for the novel  Coronavirus (2019 nCoV)    Coronavirus HKU1 NOT DETECTED NOT DETECTED Final   Coronavirus NL63 NOT DETECTED NOT DETECTED Final   Coronavirus OC43 NOT DETECTED NOT DETECTED Final   Metapneumovirus NOT DETECTED NOT DETECTED Final   Rhinovirus / Enterovirus NOT DETECTED NOT DETECTED Final   Influenza A NOT DETECTED NOT DETECTED Final   Influenza B NOT DETECTED NOT DETECTED Final  Parainfluenza Virus 1 NOT DETECTED NOT DETECTED Final   Parainfluenza Virus 2 NOT DETECTED NOT DETECTED Final   Parainfluenza Virus 3 NOT DETECTED NOT DETECTED Final   Parainfluenza Virus 4 NOT DETECTED NOT DETECTED Final   Respiratory Syncytial Virus NOT DETECTED NOT DETECTED Final   Bordetella pertussis NOT DETECTED NOT DETECTED Final   Bordetella Parapertussis NOT DETECTED NOT DETECTED Final   Chlamydophila pneumoniae NOT DETECTED NOT DETECTED Final   Mycoplasma pneumoniae NOT DETECTED NOT DETECTED Final    Comment: Performed at [facility], [address], [place], [place] [address]         Radiology Studies: CT Angio Chest Pulmonary Embolism (PE) W or WO Contrast  Result Date: 01/29/2023 CLINICAL DATA:  High probability for PE. History of pulmonary fibrosis and emphysema. EXAM: CT ANGIOGRAPHY CHEST WITH CONTRAST TECHNIQUE: Multidetector CT imaging of the chest was performed using the standard protocol during bolus administration of intravenous contrast. Multiplanar CT image reconstructions and MIPs were obtained to evaluate the vascular anatomy. RADIATION DOSE REDUCTION: This exam was performed according to the departmental dose-optimization program which includes automated exposure control, adjustment of the mA and/or kV according to patient size and/or use of iterative reconstruction technique. CONTRAST:  75mL OMNIPAQUE IOHEXOL 350 MG/ML SOLN  COMPARISON:  Chest CT 09/23/2018 FINDINGS: Cardiovascular: Heart is enlarged. Aorta is normal in size. There are atherosclerotic calcifications of the aorta. There is adequate opacification of the pulmonary arteries to the segmental level. There is no evidence for pulmonary embolism. Mediastinum/Nodes: There is an enlarged precarinal lymph node measuring 12 mm, unchanged. There is an enlarged right hilar lymph node measuring 11 mm, new from prior. Visualized esophagus and thyroid gland are within normal limits. Lungs/Pleura: There is a trace right pleural effusion similar to prior. Severe emphysema again noted. Chronic appearing interstitial changes and bronchiectasis are again noted in both lower lobes. There is new right lower lobe peribronchial wall thickening. There are new patchy ground-glass opacities in the inferior upper lobes, right middle lobe and minimally in the lower lobes. There is no evidence for pneumothorax. Upper Abdomen: No acute abnormality. Musculoskeletal: No chest wall abnormality. No acute or significant osseous findings. Review of the MIP images confirms the above findings. IMPRESSION: 1. No evidence for pulmonary embolism. 2. New patchy ground-glass opacities in the inferior upper lobes, right middle lobe and minimally in the lower lobes worrisome for multifocal pneumonia. 3. New right lower lobe peribronchial wall thickening worrisome for bronchitis. 4. Stable trace right pleural effusion. 5. Stable chronic interstitial changes and bronchiectasis in the lower lobes. 6. Stable mediastinal lymphadenopathy. New right hilar lymphadenopathy. Aortic Atherosclerosis (ICD10-I70.0) and Emphysema (ICD10-J43.9). Electronically Signed   By: Darliss Cheney M.D.   On: 01/29/2023 17:21        Scheduled Meds:  aspirin EC  81 mg Oral Daily   atorvastatin  40 mg Oral q1800   azithromycin  500 mg Oral Daily   clopidogrel  75 mg Oral Daily   dexamethasone (DECADRON) injection  4 mg Intravenous Q24H    enoxaparin (LOVENOX) injection  40 mg Subcutaneous Q24H   folic acid  1 mg Oral Daily   lamoTRIgine  100 mg Oral Daily   levothyroxine  100 mcg Oral Q0600   pantoprazole  40 mg Oral Daily   spironolactone  50 mg Oral Daily   venlafaxine XR  150 mg Oral Daily   Continuous Infusions:     LOS: 4 days        [employer]  Artelia Laroche, MD [facility] Pager [phone]  If 7PM-7AM, please contact night-coverage [email or web address] 01/31/2023, 2:20 PM

## 2023-01-31 NOTE — TOC Initial Note (Signed)
Transition of Care [facility]) - Initial/Assessment Note    Patient Details  Name: [name] MRN: [id number] Date of Birth: 04-14-1945  Transition of Care [facility]) CM/SW Contact:    Darolyn Rua, LCSW Phone Number: 01/31/2023, 4:06 PM  Clinical Narrative:                  CSW met with patient and husband at bedside to review SNF recommendations.   Patient reports that she has not been to a SNF before but that she realizes she needs additional help as her home health services did not benefit her as much as she needs currently. Reports realizing she needs a higher level of care.   Patient and husband live in [place], they are agreeable to snf bed search to be started.   Referrals sent out, pending bed offers at this time.   Patient and family updated on insurance authorization process. All questions and concerns answered at this time.    Expected Discharge Plan: Skilled Nursing Facility Barriers to Discharge: Continued Medical Work up   Patient Goals and CMS Choice Patient states their goals for this hospitalization and ongoing recovery are:: to go home CMS Medicare.gov Compare Post Acute Care list provided to:: Patient Choice offered to / list presented to : Patient      Expected Discharge Plan and Services       Living arrangements for the past 2 months: Single Family Home                                      Prior Living Arrangements/Services Living arrangements for the past 2 months: Single Family Home Lives with:: Spouse                   Activities of Daily Living   ADL Screening (condition at time of admission) Independently performs ADLs?: No Does the patient have a NEW difficulty with bathing/dressing/toileting/self-feeding that is expected to last >3 days?: Yes (Initiates electronic notice to provider for possible OT consult) Does the patient have a NEW difficulty with getting in/out of bed, walking, or climbing stairs that is expected to last  >3 days?: Yes (Initiates electronic notice to provider for possible PT consult) Does the patient have a NEW difficulty with communication that is expected to last >3 days?: No Is the patient deaf or have difficulty hearing?: No Does the patient have difficulty seeing, even when wearing glasses/contacts?: No Does the patient have difficulty concentrating, remembering, or making decisions?: No  Permission Sought/Granted                  Emotional Assessment Appearance:: Appears stated age Attitude/Demeanor/Rapport: Gracious Affect (typically observed): Calm Orientation: : Oriented to Self, Oriented to Place, Oriented to  Time, Oriented to Situation Alcohol / Substance Use: Not Applicable Psych Involvement: No (comment)  Admission diagnosis:  COPD exacerbation (HCC) [J44.1] Acute hypoxemic respiratory failure (HCC) [J96.01] Acute on chronic congestive heart failure, unspecified heart failure type [facility]) [I50.9] Patient Active Problem List   Diagnosis Date Noted   Acute hypoxemic respiratory failure (HCC) 01/27/2023   Meningioma (HCC) 09/15/2020   Chronic diastolic CHF (congestive heart failure) (HCC) 09/15/2020   Memory difficulty 08/31/2020   History of stroke 08/31/2020   Falls frequently 08/31/2020   Ataxic dysarthria 08/31/2020   Acute CHF (congestive heart failure) (HCC) 07/15/2020   Hypertension    Anterior epistaxis  Senile purpura (HCC) 06/15/2020   Ganglion of joint 06/12/2020   Hyperlipidemia 06/12/2020   Hypothyroidism due to acquired atrophy of thyroid 06/12/2020   Recurrent major depressive disorder, in partial remission (HCC) 06/12/2020   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/12/2020   Sleep apnea 06/12/2020   Elevated troponin 02/06/2020   Chronic respiratory failure with hypoxia (HCC) 12/22/2018   Immunosuppression (HCC) 12/22/2018   Pulmonary fibrosis (HCC) 12/21/2018   Bilateral carotid artery disease (HCC) 07/31/2016   Acute CVA (cerebrovascular accident)  (HCC) 07/17/2016   Other abnormal glucose 11/25/2013   Prediabetes 11/25/2013   Rheumatoid arthritis with rheumatoid factor (HCC) 09/20/2013   PCP:  Lynnea Ferrier, MD Pharmacy:   [facility] - [place], [place] - [address] [place] [address] Phone: [phone] Fax: [phone]     Social Determinants of Health (SDOH) Social History: SDOH Screenings   Food Insecurity: No Food Insecurity (01/28/2023)  Housing: Low Risk  (01/28/2023)  Transportation Needs: No Transportation Needs (01/28/2023)  Utilities: Not At Risk (01/28/2023)  Financial Resource Strain: Low Risk  (07/01/2022)   Received from [facility], [facility]  Physical Activity: Insufficiently Active (05/23/2022)   Received from [facility], [facility]  Social Connections: Socially Integrated (05/23/2022)   Received from [facility], [facility]  Stress: Stress Concern Present (05/23/2022)   Received from [facility], [facility]  Tobacco Use: Low Risk  (01/28/2023)  Recent Concern: Tobacco Use - Medium Risk (01/10/2023)   Received from [facility]   SDOH Interventions:     Readmission Risk Interventions     No data to display

## 2023-02-01 DIAGNOSIS — J9601 Acute respiratory failure with hypoxia: Secondary | ICD-10-CM | POA: Diagnosis not present

## 2023-02-01 LAB — BASIC METABOLIC PANEL
Anion gap: 8 (ref 5–15)
BUN: 18 mg/dL (ref 8–23)
CO2: 27 mmol/L (ref 22–32)
Calcium: 8.5 mg/dL — ABNORMAL LOW (ref 8.9–10.3)
Chloride: 105 mmol/L (ref 98–111)
Creatinine, Ser: 0.77 mg/dL (ref 0.44–1.00)
GFR, Estimated: >60 mL/min (ref >60)
Glucose, Bld: 102 mg/dL — ABNORMAL HIGH (ref 70–99)
Potassium: 4.1 mmol/L (ref 3.5–5.1)
Sodium: 140 mmol/L (ref 135–145)

## 2023-02-01 LAB — CULTURE, BLOOD (ROUTINE X 2)
Culture: NO GROWTH
Culture: NO GROWTH
Special Requests: ADEQUATE

## 2023-02-01 LAB — MAGNESIUM: Magnesium: 2.1 mg/dL (ref 1.7–2.4)

## 2023-02-01 NOTE — Progress Notes (Signed)
Physical Therapy Treatment Patient Details Name: [name] MRN: [id number] DOB: 29-Apr-1945 Today's Date: 02/01/2023   History of Present Illness 77 y/o female presented to ED on 01/27/23 for SOB. PMH: depression, anxiety, heart failure, chronic respiratory failure on 2L, RA, memory difficulty, hx of CVA    PT Comments  Pt is progressing with trunk control in sitting EOB at supervision level.  Pt continues to perform sit <> stand tranfers and standing balance with walker at Mod to Min A .  Pre gait training: side step along EOB with walker several steps fatiguing quickly, SPO2 88% on 3 L , with seated rest SPO2 increased to >/=90%.  Mobility continues to be limited by fatigue and weakness and pt would benefit from PT to continue to work on strengthening and activity tolerance.   If plan is discharge home, recommend the following: A lot of help with walking and/or transfers;A lot of help with bathing/dressing/bathroom;Assistance with cooking/housework;Help with stairs or ramp for entrance;Assist for transportation   Can travel by private vehicle     No  Equipment Recommendations  Rolling walker (2 wheels);BSC/3in1;Wheelchair (measurements PT);Wheelchair cushion (measurements PT)    Recommendations for Other Services       Precautions / Restrictions Precautions Precautions: Fall Precaution Comments: watch O2 Restrictions Weight Bearing Restrictions: No     Mobility  Bed Mobility Overal bed mobility: Needs Assistance Bed Mobility: Supine to Sit, Sit to Supine     Supine to sit: Mod assist, Used rails, HOB elevated Sit to supine: Mod assist   General bed mobility comments: assistance needed for trunk and BLE management and hand placement on bed rails    Transfers Overall transfer level: Needs assistance Equipment used: Rolling walker (2 wheels) Transfers: Sit to/from Stand Sit to Stand: From elevated surface, Mod assist           General transfer comment: Min/Mod A  for STS from EOB with it elevated as pt reports she has a lift chair and spouse states she raises it up to stand at home; posterior bias and needed cues to shift weight forward to more midline position, pt able to follow through.    Ambulation/Gait Ambulation/Gait assistance: Mod assist   Assistive device: Rolling walker (2 wheels) Gait Pattern/deviations: Step-to pattern     Pre-gait activities: side step along EOB with walker sveral steps fatiguing quickly, SPO2 88% on 3 L , with seated rest SPO2 increased to >/=90%.     Stairs             Wheelchair Mobility     Tilt Bed    Modified Rankin (Stroke Patients Only)       Balance   Sitting-balance support: No upper extremity supported, Feet supported Sitting balance-Leahy Scale: Fair Sitting balance - Comments: SBA and UE support on bed   Standing balance support: Bilateral upper extremity supported, Reliant on assistive device for balance Standing balance-Leahy Scale: Poor Standing balance comment: needs Min/Mod A to maintain balance in standing with RW                            Cognition Arousal: Alert Behavior During Therapy: Flat affect Overall Cognitive Status: Within Functional Limits for tasks assessed                                          Exercises  Total Joint Exercises Towel Squeeze: AROM, Strengthening, Both, 10 reps Long Arc Quad: AROM, Strengthening, Both, 5 reps Other Exercises Other Exercises: mini bridges x5    General Comments General comments (skin integrity, edema, etc.): 02 remained 89% or higher with activity throughout on 3L      Pertinent Vitals/Pain Pain Assessment Pain Assessment: Faces Faces Pain Scale: Hurts little more Pain Location: R Shoulder Pain Descriptors / Indicators: Aching Pain Intervention(s): Monitored during session    Home Living                          Prior Function            PT Goals (current goals can now  be found in the care plan section) Acute Rehab PT Goals Patient Stated Goal: to go home PT Goal Formulation: With patient/family Time For Goal Achievement: 02/14/23 Potential to Achieve Goals: Fair Progress towards PT goals: Progressing toward goals    Frequency    Min 1X/week      PT Plan      Co-evaluation              AM-PAC PT "6 Clicks" Mobility   Outcome Measure  Help needed turning from your back to your side while in a flat bed without using bedrails?: A Lot Help needed moving from lying on your back to sitting on the side of a flat bed without using bedrails?: A Lot Help needed moving to and from a bed to a chair (including a wheelchair)?: A Lot Help needed standing up from a chair using your arms (e.g., wheelchair or bedside chair)?: A Lot Help needed to walk in hospital room?: A Lot Help needed climbing 3-5 steps with a railing? : Total 6 Click Score: 11    End of Session Equipment Utilized During Treatment: Oxygen;Gait belt Activity Tolerance: Patient limited by fatigue Patient left: in bed;with call bell/phone within reach;with bed alarm set;with family/visitor present Nurse Communication: Mobility status PT Visit Diagnosis: Unsteadiness on feet (R26.81);Muscle weakness (generalized) (M62.81);Difficulty in walking, not elsewhere classified (R26.2)     Time: 4098-1191 PT Time Calculation (min) (ACUTE ONLY): 28 min  Charges:    $Therapeutic Exercise: 8-22 mins $Therapeutic Activity: 8-22 mins PT General Charges $$ ACUTE PT VISIT: 1 Visit                    Hortencia Conradi, PTA  02/01/23, 2:53 PM

## 2023-02-01 NOTE — Progress Notes (Signed)
PULMONOLOGY         Date: 02/01/2023,   MRN# [id number] [name] [birth year]     AdmissionWeight: 66.1 kg                 CurrentWeight: 66.1 kg  Referring provider: Dr Joylene Igo   CHIEF COMPLAINT:   Acute on chronic hypoxemic respiratory failure   HISTORY OF PRESENT ILLNESS    [name] is a 77 year old female with history of hypothyroid, anxiety, hyperlipidemia, hypertension, GERD, depression, history of heart failure with preserved ejection fraction, chronic respiratory failure on 2 L nasal cannula at baseline, rheumatoid arthritis, memory difficulty, history of stroke, who presents emergency department for chief concerns of shortness of breath.She requied 10L/min HFNC.  She had hypokalemia on BMP.  She had elevated BNP. She had CTPE with no PE and findig of PNA.  PCCM for further evaluation and management.    01/31/23- patient improved , now on 3L/min.  She's lucid awake alert.  Lung exam is better.  She urinated and diuresed well overnight.  CRP is markedly elevated due to Acute exacerbation of COPD.  She is on dexamethasone and is improved.   02/01/23- patient is improved s/p diuresis.  She's on 2L/min.  She has severe fatigue and is on lamictal and effexor which are centrally acting meds.    PAST MEDICAL HISTORY   Past Medical History:  Diagnosis Date   Collagen vascular disease (HCC)    Depression    Hyperlipidemia    Muscle pain    Neuropathy    RA (rheumatoid arthritis) (HCC)    Thyroid disease    Vitamin B 12 deficiency      SURGICAL HISTORY   Past Surgical History:  Procedure Laterality Date   ABDOMINAL HYSTERECTOMY     BREAST BIOPSY Left    2 benign biopsies   ECTOPIC PREGNANCY SURGERY     KNEE SURGERY Right    LAMINECTOMY     leg vein stripping Bilateral    TONSILLECTOMY       FAMILY HISTORY   Family History  Problem Relation Age of Onset   COPD Father    Cancer Father    Breast cancer Cousin    Breast cancer Maternal  Aunt    Breast cancer Maternal Aunt      SOCIAL HISTORY   Social History   Tobacco Use   Smoking status: Never    Passive exposure: Never   Smokeless tobacco: Never     MEDICATIONS    Home Medication:    Current Medication:  Current Facility-Administered Medications:    acetaminophen (TYLENOL) tablet 650 mg, 650 mg, Oral, Q6H PRN, 650 mg at 02/01/23 0636 **OR** acetaminophen (TYLENOL) suppository 650 mg, 650 mg, Rectal, Q6H PRN, Cox, Amy N, DO   aspirin EC tablet 81 mg, 81 mg, Oral, Daily, Cox, Amy N, DO, 81 mg at 02/01/23 0936   atorvastatin (LIPITOR) tablet 40 mg, 40 mg, Oral, q1800, Cox, Amy N, DO, 40 mg at 01/31/23 1733   clopidogrel (PLAVIX) tablet 75 mg, 75 mg, Oral, Daily, Cox, Amy N, DO, 75 mg at 02/01/23 0937   dexamethasone (DECADRON) injection 4 mg, 4 mg, Intravenous, Q24H, Lydia Toren, MD, 4 mg at 01/31/23 1333   enoxaparin (LOVENOX) injection 40 mg, 40 mg, Subcutaneous, Q24H, Williams, Jamiese M, MD, 40 mg at 01/31/23 1733   folic acid (FOLVITE) tablet 1 mg, 1 mg, Oral, Daily, Cox, Amy N, DO, 1 mg at 02/01/23 [phone]  ipratropium-albuterol (DUONEB) 0.5-2.5 (3) MG/3ML nebulizer solution 3 mL, 3 mL, Nebulization, Q4H PRN, Andris Baumann, MD, 3 mL at 02/01/23 0815   lamoTRIgine (LAMICTAL) tablet 100 mg, 100 mg, Oral, Daily, Cox, Amy N, DO, 100 mg at 02/01/23 1610   levothyroxine (SYNTHROID) tablet 100 mcg, 100 mcg, Oral, Q0600, Cox, Amy N, DO, 100 mcg at 02/01/23 0534   ondansetron (ZOFRAN) tablet 4 mg, 4 mg, Oral, Q6H PRN **OR** ondansetron (ZOFRAN) injection 4 mg, 4 mg, Intravenous, Q6H PRN, Cox, Amy N, DO   pantoprazole (PROTONIX) EC tablet 40 mg, 40 mg, Oral, Daily, Cox, Amy N, DO, 40 mg at 02/01/23 9604   senna-docusate (Senokot-S) tablet 1 tablet, 1 tablet, Oral, QHS PRN, Cox, Amy N, DO, 1 tablet at 02/01/23 5409   spironolactone (ALDACTONE) tablet 50 mg, 50 mg, Oral, Daily, Vida Rigger, MD, 50 mg at 02/01/23 8119   venlafaxine XR (EFFEXOR-XR) 24 hr  capsule 150 mg, 150 mg, Oral, Daily, Cox, Amy N, DO, 150 mg at 02/01/23 1478    ALLERGIES   Codeine and Demerol [meperidine]     REVIEW OF SYSTEMS    Review of Systems:  Gen:  Denies  fever, sweats, chills weigh loss  HEENT: Denies blurred vision, double vision, ear pain, eye pain, hearing loss, nose bleeds, sore throat Cardiac:  No dizziness, chest pain or heaviness, chest tightness,edema Resp:   reports dyspnea chronically  Gi: Denies swallowing difficulty, stomach pain, nausea or vomiting, diarrhea, constipation, bowel incontinence Gu:  Denies bladder incontinence, burning urine Ext:   Denies Joint pain, stiffness or swelling Skin: Denies  skin rash, easy bruising or bleeding or hives Endoc:  Denies polyuria, polydipsia , polyphagia or weight change Psych:   Denies depression, insomnia or hallucinations   Other:  All other systems negative   VS: BP (!) 154/94 (BP Location: Right Arm)   Pulse 95   Temp 98.5 F (36.9 C) (Oral)   Resp 20   Ht 5\' 7"  (1.702 m)   Wt 66.1 kg   SpO2 97%   BMI 22.82 kg/m      PHYSICAL EXAM    GENERAL:NAD, no fevers, chills, no weakness no fatigue HEAD: Normocephalic, atraumatic.  EYES: Pupils equal, round, reactive to light. Extraocular muscles intact. No scleral icterus.  MOUTH: Moist mucosal membrane. Dentition intact. No abscess noted.  EAR, NOSE, THROAT: Clear without exudates. No external lesions.  NECK: Supple. No thyromegaly. No nodules. No JVD.  PULMONARY: decreased breath sounds with mild rhonchi worse at bases bilaterally.  CARDIOVASCULAR: S1 and S2. Regular rate and rhythm. No murmurs, rubs, or gallops. No edema. Pedal pulses 2+ bilaterally.  GASTROINTESTINAL: Soft, nontender, nondistended. No masses. Positive bowel sounds. No hepatosplenomegaly.  MUSCULOSKELETAL: No swelling, clubbing, or edema. Range of motion full in all extremities.  NEUROLOGIC: Cranial nerves II through XII are intact. No gross focal neurological  deficits. Sensation intact. Reflexes intact.  SKIN: No ulceration, lesions, rashes, or cyanosis. Skin warm and dry. Turgor intact.  PSYCHIATRIC: Mood, affect within normal limits. The patient is awake, alert and oriented x 3. Insight, judgment intact.       IMAGING    Narrative & Impression  CLINICAL DATA:  High probability for PE. History of pulmonary fibrosis and emphysema.   EXAM: CT ANGIOGRAPHY CHEST WITH CONTRAST   TECHNIQUE: Multidetector CT imaging of the chest was performed using the standard protocol during bolus administration of intravenous contrast. Multiplanar CT image reconstructions and MIPs were obtained to evaluate the vascular anatomy.  RADIATION DOSE REDUCTION: This exam was performed according to the departmental dose-optimization program which includes automated exposure control, adjustment of the mA and/or kV according to patient size and/or use of iterative reconstruction technique.   CONTRAST:  75mL OMNIPAQUE IOHEXOL 350 MG/ML SOLN   COMPARISON:  Chest CT 09/23/2018   FINDINGS: Cardiovascular: Heart is enlarged. Aorta is normal in size. There are atherosclerotic calcifications of the aorta. There is adequate opacification of the pulmonary arteries to the segmental level. There is no evidence for pulmonary embolism.   Mediastinum/Nodes: There is an enlarged precarinal lymph node measuring 12 mm, unchanged. There is an enlarged right hilar lymph node measuring 11 mm, new from prior. Visualized esophagus and thyroid gland are within normal limits.   Lungs/Pleura: There is a trace right pleural effusion similar to prior. Severe emphysema again noted. Chronic appearing interstitial changes and bronchiectasis are again noted in both lower lobes. There is new right lower lobe peribronchial wall thickening. There are new patchy ground-glass opacities in the inferior upper lobes, right middle lobe and minimally in the lower lobes. There is no evidence  for pneumothorax.   Upper Abdomen: No acute abnormality.   Musculoskeletal: No chest wall abnormality. No acute or significant osseous findings.   Review of the MIP images confirms the above findings.   IMPRESSION: 1. No evidence for pulmonary embolism. 2. New patchy ground-glass opacities in the inferior upper lobes, right middle lobe and minimally in the lower lobes worrisome for multifocal pneumonia. 3. New right lower lobe peribronchial wall thickening worrisome for bronchitis. 4. Stable trace right pleural effusion. 5. Stable chronic interstitial changes and bronchiectasis in the lower lobes. 6. Stable mediastinal lymphadenopathy. New right hilar lymphadenopathy.   Aortic Atherosclerosis (ICD10-I70.0) and Emphysema (ICD10-J43.9).     Electronically Signed   By: Darliss Cheney M.D.    ASSESSMENT/PLAN   Acute on chronic hypoxemic respiratory failure Patient is currently improved on 4L/min [place] -due to acute exacerbation of COPD with RA related pulmonary fibrosis -RVP is negative but she seems to have pneumonia  - may be false negative COVID strain -treating with dexamethasone 4mg  IV due to elevated crp with hx of RA -nebulizer therapy  -IS and flutter valve for chest PT Metaneb -OOB to chair    Combined pulmonary fibrosis and emphysema (CPFE) - continue therapy as above   Thank you for allowing me to participate in the care of this patient.   Patient/Family are satisfied with care plan and all questions have been answered.    Provider disclosure: Patient with at least one acute or chronic illness or injury that poses a threat to life or bodily function and is being managed actively during this encounter.  All of the below services have been performed independently by signing provider:  review of prior documentation from internal and or external health records.  Review of previous and current lab results.  Interview and comprehensive assessment during patient  visit today. Review of current and previous chest radiographs/CT scans. Discussion of management and test interpretation with health care team and patient/family.   This document was prepared using Dragon voice recognition software and may include unintentional dictation errors.     Vida Rigger, M.D.  Division of Pulmonary & Critical Care Medicine

## 2023-02-01 NOTE — Progress Notes (Signed)
PROGRESS NOTE    [name]  IEP:[id number] DOB: 10-29-1945 DOA: 01/27/2023 PCP: Lynnea Ferrier, MD   Assessment & Plan:   Principal Problem:   Acute hypoxemic respiratory failure (HCC) Active Problems:   Rheumatoid arthritis with rheumatoid factor (HCC)   Elevated troponin   Hyperlipidemia   Hypothyroidism due to acquired atrophy of thyroid   Immunosuppression (HCC)   Pulmonary fibrosis (HCC)   Recurrent major depressive disorder, in partial remission (HCC)   Sleep apnea   Hypertension   History of stroke  Assessment and Plan: Acute on chronic hypoxic respiratory failure: likely secondary to CAP. Continue on azithromycin, bronchodilators & encourage incentive spirometry. Continue on IV steroids as per pulmon. D-dimer was elevated. CTA chest shows multifocal pneumonia but no PE. CRP was elevated, resp viral panel was neg. Pulmon following and recs apprec. Hx of pulmon fibrosis, emphysema & bronchiectasis. Continue on supplemental oxygen and wean back to baseline as tolerated, currently on 3L [place]. Uses 2L [place] at home   CAP: completed abx course. Continue on steroids, bronchodilators, encourage incentive spirometry as per pulmon. Pulmon following and recs apprec  Possible acute on chronic diastolic CHF: s/p IV lasix x 3. Monitor I/Os    Sleep apnea: does not use CPAP    Recurrent major depressive disorder: in partial remission. Continue on venlafaxine   Hypothyroidism: continue on levothyroxine    HLD: continue on statin    Elevated troponin: likely secondary to demand ischemia. No chest pain. Trending down now. D/c IV heparin drip. CTA chest was neg for PE   Rheumatoid arthritis: on methotrexate weekly         DVT prophylaxis: lovenox Code Status: full  Family Communication: discussed pt's care w/ pt's family at bedside and answered their questions  Disposition Plan: likely d/c to SNF   Level of care: Telemetry Cardiac  Status is: Inpatient Remains  inpatient appropriate because: severity of illness    Consultants:  Pulmon   Procedures:   Antimicrobials:  Subjective: Pt c/o malaise    Objective: Vitals:   02/01/23 0353 02/01/23 0817 02/01/23 0825 02/01/23 1146  BP: (!) 143/80  (!) 154/94 (!) 123/58  Pulse: 80  95 71  Resp: 18  20 20   Temp: 97.8 F (36.6 C)  98.5 F (36.9 C) 97.6 F (36.4 C)  TempSrc:   Oral Oral  SpO2: 97% 96% 97% 96%  Weight:      Height:        Intake/Output Summary (Last 24 hours) at 02/01/2023 1402 Last data filed at 01/31/2023 2100 Gross per 24 hour  Intake --  Output 300 ml  Net -300 ml   Filed Weights   01/27/23 1911  Weight: 66.1 kg    Examination:  General exam: appears comfortable  Respiratory system: diminished breath sounds b/l  Cardiovascular system: S1/S+. No rubs or clicks  Gastrointestinal system: abd is soft, NT, ND & hypoactive bowel sounds  Central nervous system: awake and alert Psychiatry: judgement and insight improved, appropriate mood and affect    Data Reviewed: I have personally reviewed following labs and imaging studies  CBC: Recent Labs  Lab 01/27/23 1915 01/28/23 0500 01/29/23 0535 01/30/23 0431  WBC 11.6* 4.9 6.1 5.2  NEUTROABS 6.6  --   --   --   HGB 10.8* 9.6* 9.7* 10.2*  HCT 33.3* 29.7* 30.5* 30.9*  MCV 105.4* 104.9* 106.3* 101.6*  PLT 264 201 248 255   Basic Metabolic Panel: Recent Labs  Lab 01/28/23 0025  01/28/23 0500 01/29/23 0535 01/30/23 0431 01/31/23 0441 02/01/23 0521  NA  --  138 139 137 139 140  K  --  3.4* 3.5 3.0* 4.3 4.1  CL  --  105 106 102 103 105  CO2  --  25 26 27 28 27   GLUCOSE  --  160* 92 89 120* 102*  BUN  --  17 29* 14 13 18   CREATININE  --  0.99 1.06* 0.67 0.61 0.77  CALCIUM  --  7.8* 7.9* 8.1* 8.4* 8.5*  MG 1.8  --  2.2 2.0 2.3 2.1   GFR: Estimated Creatinine Clearance: 56.4 mL/min (by C-G formula based on SCr of 0.77 mg/dL). Liver Function Tests: Recent Labs  Lab 01/27/23 1915  AST 24  ALT 16   ALKPHOS 76  BILITOT 1.4*  PROT 7.4  ALBUMIN 3.6   No results for input(s): "LIPASE", "AMYLASE" in the last 168 hours. No results for input(s): "AMMONIA" in the last 168 hours. Coagulation Profile: Recent Labs  Lab 01/28/23 0025  INR 1.2   Cardiac Enzymes: No results for input(s): "CKTOTAL", "CKMB", "CKMBINDEX", "TROPONINI" in the last 168 hours. BNP (last 3 results) No results for input(s): "PROBNP" in the last 8760 hours. HbA1C: No results for input(s): "HGBA1C" in the last 72 hours. CBG: No results for input(s): "GLUCAP" in the last 168 hours. Lipid Profile: No results for input(s): "CHOL", "HDL", "LDLCALC", "TRIG", "CHOLHDL", "LDLDIRECT" in the last 72 hours. Thyroid Function Tests: No results for input(s): "TSH", "T4TOTAL", "FREET4", "T3FREE", "THYROIDAB" in the last 72 hours. Anemia Panel: No results for input(s): "VITAMINB12", "FOLATE", "FERRITIN", "TIBC", "IRON", "RETICCTPCT" in the last 72 hours. Sepsis Labs: Recent Labs  Lab 01/27/23 2258 01/28/23 0025 01/28/23 0031 01/28/23 0500 01/29/23 0535  PROCALCITON  --  5.71  --  8.05 8.86  LATICACIDVEN 1.9  --  2.0*  --   --     Recent Results (from the past 240 hour(s))  Blood Culture (routine x 2)     Status: None   Collection Time: 01/27/23  7:22 PM   Specimen: BLOOD  Result Value Ref Range Status   Specimen Description BLOOD RIGHT ASSIST CONTROL  Final   Special Requests   Final    BOTTLES DRAWN AEROBIC AND ANAEROBIC Blood Culture results may not be optimal due to an excessive volume of blood received in culture bottles   Culture   Final    NO GROWTH 5 DAYS Performed at [facility], [address], [place], [place] [address]    Report Status 02/01/2023 FINAL  Final  Blood Culture (routine x 2)     Status: None   Collection Time: 01/27/23 10:58 PM   Specimen: BLOOD  Result Value Ref Range Status   Specimen Description BLOOD BLOOD RIGHT ARM  Final   Special Requests   Final    BOTTLES DRAWN  AEROBIC AND ANAEROBIC Blood Culture adequate volume   Culture   Final    NO GROWTH 5 DAYS Performed at [facility], [address] [place], [place] [address]    Report Status 02/01/2023 FINAL  Final  Resp panel by RT-PCR (RSV, Flu A&B, Covid) Anterior Nasal Swab     Status: None   Collection Time: 01/28/23 12:25 AM   Specimen: Anterior Nasal Swab  Result Value Ref Range Status   SARS Coronavirus 2 by RT PCR NEGATIVE NEGATIVE Final    Comment: (NOTE) SARS-CoV-2 target nucleic acids are NOT DETECTED.  The SARS-CoV-2 RNA is generally detectable  in upper respiratory specimens during the acute phase of infection. The lowest concentration of SARS-CoV-2 viral copies this assay can detect is 138 copies/mL. A negative result does not preclude SARS-Cov-2 infection and should not be used as the sole basis for treatment or other patient management decisions. A negative result may occur with  improper specimen collection/handling, submission of specimen other than nasopharyngeal swab, presence of viral mutation(s) within the areas targeted by this assay, and inadequate number of viral copies(<138 copies/mL). A negative result must be combined with clinical observations, patient history, and epidemiological information. The expected result is Negative.  Fact Sheet for Patients:  BloggerCourse.com  Fact Sheet for Healthcare Providers:  SeriousBroker.it  This test is no t yet approved or cleared by the [place] FDA and  has been authorized for detection and/or diagnosis of SARS-CoV-2 by FDA under an Emergency Use Authorization (EUA). This EUA will remain  in effect (meaning this test can be used) for the duration of the COVID-19 declaration under Section 564(b)(1) of the Act, 21 U.S.C.section 360bbb-3(b)(1), unless the authorization is terminated  or revoked sooner.       Influenza A by PCR NEGATIVE NEGATIVE Final    Influenza B by PCR NEGATIVE NEGATIVE Final    Comment: (NOTE) The Xpert Xpress SARS-CoV-2/FLU/RSV plus assay is intended as an aid in the diagnosis of influenza from Nasopharyngeal swab specimens and should not be used as a sole basis for treatment. Nasal washings and aspirates are unacceptable for Xpert Xpress SARS-CoV-2/FLU/RSV testing.  Fact Sheet for Patients: BloggerCourse.com  Fact Sheet for Healthcare Providers: SeriousBroker.it  This test is not yet approved or cleared by the [place] FDA and has been authorized for detection and/or diagnosis of SARS-CoV-2 by FDA under an Emergency Use Authorization (EUA). This EUA will remain in effect (meaning this test can be used) for the duration of the COVID-19 declaration under Section 564(b)(1) of the Act, 21 U.S.C. section 360bbb-3(b)(1), unless the authorization is terminated or revoked.     Resp Syncytial Virus by PCR NEGATIVE NEGATIVE Final    Comment: (NOTE) Fact Sheet for Patients: BloggerCourse.com  Fact Sheet for Healthcare Providers: SeriousBroker.it  This test is not yet approved or cleared by the [place] FDA and has been authorized for detection and/or diagnosis of SARS-CoV-2 by FDA under an Emergency Use Authorization (EUA). This EUA will remain in effect (meaning this test can be used) for the duration of the COVID-19 declaration under Section 564(b)(1) of the Act, 21 U.S.C. section 360bbb-3(b)(1), unless the authorization is terminated or revoked.  Performed at [facility], [address] [place], [place] [address]   Respiratory (~20 pathogens) panel by PCR     Status: None   Collection Time: 01/29/23  2:44 PM   Specimen: Nasopharyngeal Swab; Respiratory  Result Value Ref Range Status   Adenovirus NOT DETECTED NOT DETECTED Final   Coronavirus 229E NOT DETECTED NOT DETECTED Final     Comment: (NOTE) The Coronavirus on the Respiratory Panel, DOES NOT test for the novel  Coronavirus (2019 nCoV)    Coronavirus HKU1 NOT DETECTED NOT DETECTED Final   Coronavirus NL63 NOT DETECTED NOT DETECTED Final   Coronavirus OC43 NOT DETECTED NOT DETECTED Final   Metapneumovirus NOT DETECTED NOT DETECTED Final   Rhinovirus / Enterovirus NOT DETECTED NOT DETECTED Final   Influenza A NOT DETECTED NOT DETECTED Final   Influenza B NOT DETECTED NOT DETECTED Final   Parainfluenza Virus 1 NOT DETECTED NOT DETECTED Final   Parainfluenza  Virus 2 NOT DETECTED NOT DETECTED Final   Parainfluenza Virus 3 NOT DETECTED NOT DETECTED Final   Parainfluenza Virus 4 NOT DETECTED NOT DETECTED Final   Respiratory Syncytial Virus NOT DETECTED NOT DETECTED Final   Bordetella pertussis NOT DETECTED NOT DETECTED Final   Bordetella Parapertussis NOT DETECTED NOT DETECTED Final   Chlamydophila pneumoniae NOT DETECTED NOT DETECTED Final   Mycoplasma pneumoniae NOT DETECTED NOT DETECTED Final    Comment: Performed at [facility], [address], [place], [place] [address]         Radiology Studies: DG Chest Port 1 View  Result Date: 01/31/2023 CLINICAL DATA:  Atelectasis. EXAM: PORTABLE CHEST 1 VIEW COMPARISON:  January 27, 2023. FINDINGS: Stable cardiomediastinal silhouette. Minimal bibasilar subsegmental atelectasis or scarring is noted. Stable right midlung opacity is noted concerning for atelectasis or scarring. Bony thorax is unremarkable. IMPRESSION: Minimal bibasilar subsegmental atelectasis or scarring. Stable right midlung scarring or atelectasis is noted. Electronically Signed   By: Lupita Raider M.D.   On: 01/31/2023 14:26        Scheduled Meds:  aspirin EC  81 mg Oral Daily   atorvastatin  40 mg Oral q1800   clopidogrel  75 mg Oral Daily   dexamethasone (DECADRON) injection  4 mg Intravenous Q24H   enoxaparin (LOVENOX) injection  40 mg Subcutaneous Q24H   folic acid  1 mg Oral  Daily   lamoTRIgine  100 mg Oral Daily   levothyroxine  100 mcg Oral Q0600   pantoprazole  40 mg Oral Daily   spironolactone  50 mg Oral Daily   venlafaxine XR  150 mg Oral Daily   Continuous Infusions:     LOS: 5 days        Charise Killian, MD [facility] Pager [phone]  If 7PM-7AM, please contact night-coverage [email or web address] 02/01/2023, 2:02 PM

## 2023-02-01 NOTE — Plan of Care (Signed)
  Problem: Education: Goal: Knowledge of General Education information will improve Description: Including pain rating scale, medication(s)/side effects and non-pharmacologic comfort measures Outcome: Progressing   Problem: Health Behavior/Discharge Planning: Goal: Ability to manage health-related needs will improve Outcome: Progressing   Problem: Clinical Measurements: Goal: Ability to maintain clinical measurements within normal limits will improve Outcome: Progressing Goal: Will remain free from infection Outcome: Progressing Goal: Diagnostic test results will improve Outcome: Progressing Goal: Respiratory complications will improve Outcome: Progressing Goal: Cardiovascular complication will be avoided Outcome: Progressing   Problem: Activity: Goal: Risk for activity intolerance will decrease Outcome: Progressing   Problem: Nutrition: Goal: Adequate nutrition will be maintained Outcome: Progressing   Problem: Elimination: Goal: Will not experience complications related to bowel motility Outcome: Progressing Goal: Will not experience complications related to urinary retention Outcome: Progressing   Problem: Pain Management: Goal: General experience of comfort will improve Outcome: Progressing   Problem: Safety: Goal: Ability to remain free from injury will improve Outcome: Progressing   Problem: Skin Integrity: Goal: Risk for impaired skin integrity will decrease Outcome: Progressing

## 2023-02-02 DIAGNOSIS — J9601 Acute respiratory failure with hypoxia: Secondary | ICD-10-CM | POA: Diagnosis not present

## 2023-02-02 LAB — BASIC METABOLIC PANEL
Anion gap: 7 (ref 5–15)
BUN: 16 mg/dL (ref 8–23)
CO2: 27 mmol/L (ref 22–32)
Calcium: 8.7 mg/dL — ABNORMAL LOW (ref 8.9–10.3)
Chloride: 108 mmol/L (ref 98–111)
Creatinine, Ser: 0.74 mg/dL (ref 0.44–1.00)
GFR, Estimated: >60 mL/min (ref >60)
Glucose, Bld: 102 mg/dL — ABNORMAL HIGH (ref 70–99)
Potassium: 4.3 mmol/L (ref 3.5–5.1)
Sodium: 142 mmol/L (ref 135–145)

## 2023-02-02 LAB — MAGNESIUM: Magnesium: 2 mg/dL (ref 1.7–2.4)

## 2023-02-02 NOTE — Plan of Care (Signed)
  Problem: Education: Goal: Knowledge of General Education information will improve Description: Including pain rating scale, medication(s)/side effects and non-pharmacologic comfort measures Outcome: Progressing   Problem: Health Behavior/Discharge Planning: Goal: Ability to manage health-related needs will improve Outcome: Progressing   Problem: Clinical Measurements: Goal: Ability to maintain clinical measurements within normal limits will improve Outcome: Progressing Goal: Will remain free from infection Outcome: Progressing Goal: Diagnostic test results will improve Outcome: Progressing Goal: Respiratory complications will improve Outcome: Progressing Goal: Cardiovascular complication will be avoided Outcome: Progressing   Problem: Activity: Goal: Risk for activity intolerance will decrease Outcome: Progressing   Problem: Nutrition: Goal: Adequate nutrition will be maintained Outcome: Progressing   Problem: Coping: Goal: Level of anxiety will decrease Outcome: Progressing   Problem: Elimination: Goal: Will not experience complications related to bowel motility Outcome: Progressing   Problem: Pain Management: Goal: General experience of comfort will improve Outcome: Progressing   Problem: Safety: Goal: Ability to remain free from injury will improve Outcome: Progressing   Problem: Skin Integrity: Goal: Risk for impaired skin integrity will decrease Outcome: Progressing

## 2023-02-02 NOTE — Progress Notes (Signed)
PROGRESS NOTE    [name]  QQV:[id number] DOB: May 05, 1945 DOA: 01/27/2023 PCP: Lynnea Ferrier, MD   Assessment & Plan:   Principal Problem:   Acute hypoxemic respiratory failure (HCC) Active Problems:   Rheumatoid arthritis with rheumatoid factor (HCC)   Elevated troponin   Hyperlipidemia   Hypothyroidism due to acquired atrophy of thyroid   Immunosuppression (HCC)   Pulmonary fibrosis (HCC)   Recurrent major depressive disorder, in partial remission (HCC)   Sleep apnea   Hypertension   History of stroke  Assessment and Plan: Acute on chronic hypoxic respiratory failure: likely secondary to CAP. Continue on azithromycin, bronchodilators & encourage incentive spirometry. Continue on IV steroids as per pulmon. D-dimer was elevated. CTA chest shows multifocal pneumonia but no PE. CRP was elevated, resp viral panel was neg. Pulmon following and recs apprec. Hx of pulmon fibrosis, emphysema & bronchiectasis. Continue on supplemental oxygen and wean back to baseline as tolerated, currently on 3L [place]. Uses 2L [place] at home   CAP: completed abx course. Continue on steroids, bronchodilators, encourage incentive spirometry as per pulmon. Pulmon following and recs apprec  Possible acute on chronic diastolic CHF: s/p IV lasix x 3. Monitor I/Os   Sleep apnea: does not use CPAP    Recurrent major depressive disorder: in partial remission. Continue on venlafaxine   Hypothyroidism: continue on levothyroxine    HLD: continue on statin    Elevated troponin: likely secondary to demand ischemia. No chest pain. Trending down now. D/c IV heparin drip. CTA chest was neg for PE   Rheumatoid arthritis: on methotrexate weekly         DVT prophylaxis: lovenox Code Status: full  Family Communication: Disposition Plan: likely d/c to SNF   Level of care: Telemetry Cardiac  Status is: Inpatient Remains inpatient appropriate because: medically stable. Waiting on SNF placement      Consultants:  Pulmon   Procedures:   Antimicrobials:  Subjective: Pt c/o fatigue   Objective: Vitals:   02/01/23 2112 02/01/23 2341 02/02/23 0324 02/02/23 0805  BP: 138/70 137/62 125/74   Pulse: 64 61 82   Resp: 20 18 18    Temp: 98.3 F (36.8 C) (!) 97.5 F (36.4 C) (!) 97.4 F (36.3 C)   TempSrc: Oral Oral Oral   SpO2: 97% 97% 91% 93%  Weight:      Height:        Intake/Output Summary (Last 24 hours) at 02/02/2023 0814 Last data filed at 02/02/2023 0630 Gross per 24 hour  Intake 440 ml  Output --  Net 440 ml   Filed Weights   01/27/23 1911  Weight: 66.1 kg    Examination:  General exam: appears calm & comfortable  Respiratory system: decreased breath sounds b/l  Cardiovascular system: S1/S2+. No rubs or clicks Gastrointestinal system: abd is soft, NT, ND & normal bowel sounds Central nervous system: alert & awake Psychiatry: judgement and insight improved, appropriate mood and affect    Data Reviewed: I have personally reviewed following labs and imaging studies  CBC: Recent Labs  Lab 01/27/23 1915 01/28/23 0500 01/29/23 0535 01/30/23 0431  WBC 11.6* 4.9 6.1 5.2  NEUTROABS 6.6  --   --   --   HGB 10.8* 9.6* 9.7* 10.2*  HCT 33.3* 29.7* 30.5* 30.9*  MCV 105.4* 104.9* 106.3* 101.6*  PLT 264 201 248 255   Basic Metabolic Panel: Recent Labs  Lab 01/29/23 0535 01/30/23 0431 01/31/23 0441 02/01/23 0521 02/02/23 0430  NA 139 137  139 140 142  K 3.5 3.0* 4.3 4.1 4.3  CL 106 102 103 105 108  CO2 26 27 28 27 27   GLUCOSE 92 89 120* 102* 102*  BUN 29* 14 13 18 16   CREATININE 1.06* 0.67 0.61 0.77 0.74  CALCIUM 7.9* 8.1* 8.4* 8.5* 8.7*  MG 2.2 2.0 2.3 2.1 2.0   GFR: Estimated Creatinine Clearance: 56.4 mL/min (by C-G formula based on SCr of 0.74 mg/dL). Liver Function Tests: Recent Labs  Lab 01/27/23 1915  AST 24  ALT 16  ALKPHOS 76  BILITOT 1.4*  PROT 7.4  ALBUMIN 3.6   No results for input(s): "LIPASE", "AMYLASE" in the last 168  hours. No results for input(s): "AMMONIA" in the last 168 hours. Coagulation Profile: Recent Labs  Lab 01/28/23 0025  INR 1.2   Cardiac Enzymes: No results for input(s): "CKTOTAL", "CKMB", "CKMBINDEX", "TROPONINI" in the last 168 hours. BNP (last 3 results) No results for input(s): "PROBNP" in the last 8760 hours. HbA1C: No results for input(s): "HGBA1C" in the last 72 hours. CBG: No results for input(s): "GLUCAP" in the last 168 hours. Lipid Profile: No results for input(s): "CHOL", "HDL", "LDLCALC", "TRIG", "CHOLHDL", "LDLDIRECT" in the last 72 hours. Thyroid Function Tests: No results for input(s): "TSH", "T4TOTAL", "FREET4", "T3FREE", "THYROIDAB" in the last 72 hours. Anemia Panel: No results for input(s): "VITAMINB12", "FOLATE", "FERRITIN", "TIBC", "IRON", "RETICCTPCT" in the last 72 hours. Sepsis Labs: Recent Labs  Lab 01/27/23 2258 01/28/23 0025 01/28/23 0031 01/28/23 0500 01/29/23 0535  PROCALCITON  --  5.71  --  8.05 8.86  LATICACIDVEN 1.9  --  2.0*  --   --     Recent Results (from the past 240 hour(s))  Blood Culture (routine x 2)     Status: None   Collection Time: 01/27/23  7:22 PM   Specimen: BLOOD  Result Value Ref Range Status   Specimen Description BLOOD RIGHT ASSIST CONTROL  Final   Special Requests   Final    BOTTLES DRAWN AEROBIC AND ANAEROBIC Blood Culture results may not be optimal due to an excessive volume of blood received in culture bottles   Culture   Final    NO GROWTH 5 DAYS Performed at [facility], [address], [place], [place] [address]    Report Status 02/01/2023 FINAL  Final  Blood Culture (routine x 2)     Status: None   Collection Time: 01/27/23 10:58 PM   Specimen: BLOOD  Result Value Ref Range Status   Specimen Description BLOOD BLOOD RIGHT ARM  Final   Special Requests   Final    BOTTLES DRAWN AEROBIC AND ANAEROBIC Blood Culture adequate volume   Culture   Final    NO GROWTH 5 DAYS Performed at [facility], [address] [place], [place] [address]    Report Status 02/01/2023 FINAL  Final  Resp panel by RT-PCR (RSV, Flu A&B, Covid) Anterior Nasal Swab     Status: None   Collection Time: 01/28/23 12:25 AM   Specimen: Anterior Nasal Swab  Result Value Ref Range Status   SARS Coronavirus 2 by RT PCR NEGATIVE NEGATIVE Final    Comment: (NOTE) SARS-CoV-2 target nucleic acids are NOT DETECTED.  The SARS-CoV-2 RNA is generally detectable in upper respiratory specimens during the acute phase of infection. The lowest concentration of SARS-CoV-2 viral copies this assay can detect is 138 copies/mL. A negative result does not preclude SARS-Cov-2 infection and should not be used as the sole basis  for treatment or other patient management decisions. A negative result may occur with  improper specimen collection/handling, submission of specimen other than nasopharyngeal swab, presence of viral mutation(s) within the areas targeted by this assay, and inadequate number of viral copies(<138 copies/mL). A negative result must be combined with clinical observations, patient history, and epidemiological information. The expected result is Negative.  Fact Sheet for Patients:  BloggerCourse.com  Fact Sheet for Healthcare Providers:  SeriousBroker.it  This test is no t yet approved or cleared by the [place] FDA and  has been authorized for detection and/or diagnosis of SARS-CoV-2 by FDA under an Emergency Use Authorization (EUA). This EUA will remain  in effect (meaning this test can be used) for the duration of the COVID-19 declaration under Section 564(b)(1) of the Act, 21 U.S.C.section 360bbb-3(b)(1), unless the authorization is terminated  or revoked sooner.       Influenza A by PCR NEGATIVE NEGATIVE Final   Influenza B by PCR NEGATIVE NEGATIVE Final    Comment: (NOTE) The Xpert Xpress SARS-CoV-2/FLU/RSV plus assay is intended  as an aid in the diagnosis of influenza from Nasopharyngeal swab specimens and should not be used as a sole basis for treatment. Nasal washings and aspirates are unacceptable for Xpert Xpress SARS-CoV-2/FLU/RSV testing.  Fact Sheet for Patients: BloggerCourse.com  Fact Sheet for Healthcare Providers: SeriousBroker.it  This test is not yet approved or cleared by the [place] FDA and has been authorized for detection and/or diagnosis of SARS-CoV-2 by FDA under an Emergency Use Authorization (EUA). This EUA will remain in effect (meaning this test can be used) for the duration of the COVID-19 declaration under Section 564(b)(1) of the Act, 21 U.S.C. section 360bbb-3(b)(1), unless the authorization is terminated or revoked.     Resp Syncytial Virus by PCR NEGATIVE NEGATIVE Final    Comment: (NOTE) Fact Sheet for Patients: BloggerCourse.com  Fact Sheet for Healthcare Providers: SeriousBroker.it  This test is not yet approved or cleared by the [place] FDA and has been authorized for detection and/or diagnosis of SARS-CoV-2 by FDA under an Emergency Use Authorization (EUA). This EUA will remain in effect (meaning this test can be used) for the duration of the COVID-19 declaration under Section 564(b)(1) of the Act, 21 U.S.C. section 360bbb-3(b)(1), unless the authorization is terminated or revoked.  Performed at [facility], [address] [place], [place] [address]   Respiratory (~20 pathogens) panel by PCR     Status: None   Collection Time: 01/29/23  2:44 PM   Specimen: Nasopharyngeal Swab; Respiratory  Result Value Ref Range Status   Adenovirus NOT DETECTED NOT DETECTED Final   Coronavirus 229E NOT DETECTED NOT DETECTED Final    Comment: (NOTE) The Coronavirus on the Respiratory Panel, DOES NOT test for the novel  Coronavirus (2019 nCoV)     Coronavirus HKU1 NOT DETECTED NOT DETECTED Final   Coronavirus NL63 NOT DETECTED NOT DETECTED Final   Coronavirus OC43 NOT DETECTED NOT DETECTED Final   Metapneumovirus NOT DETECTED NOT DETECTED Final   Rhinovirus / Enterovirus NOT DETECTED NOT DETECTED Final   Influenza A NOT DETECTED NOT DETECTED Final   Influenza B NOT DETECTED NOT DETECTED Final   Parainfluenza Virus 1 NOT DETECTED NOT DETECTED Final   Parainfluenza Virus 2 NOT DETECTED NOT DETECTED Final   Parainfluenza Virus 3 NOT DETECTED NOT DETECTED Final   Parainfluenza Virus 4 NOT DETECTED NOT DETECTED Final   Respiratory Syncytial Virus NOT DETECTED NOT DETECTED Final   Bordetella pertussis  NOT DETECTED NOT DETECTED Final   Bordetella Parapertussis NOT DETECTED NOT DETECTED Final   Chlamydophila pneumoniae NOT DETECTED NOT DETECTED Final   Mycoplasma pneumoniae NOT DETECTED NOT DETECTED Final    Comment: Performed at [facility], [address], [place], [place] [address]         Radiology Studies: DG Chest Port 1 View  Result Date: 01/31/2023 CLINICAL DATA:  Atelectasis. EXAM: PORTABLE CHEST 1 VIEW COMPARISON:  January 27, 2023. FINDINGS: Stable cardiomediastinal silhouette. Minimal bibasilar subsegmental atelectasis or scarring is noted. Stable right midlung opacity is noted concerning for atelectasis or scarring. Bony thorax is unremarkable. IMPRESSION: Minimal bibasilar subsegmental atelectasis or scarring. Stable right midlung scarring or atelectasis is noted. Electronically Signed   By: Lupita Raider M.D.   On: 01/31/2023 14:26        Scheduled Meds:  aspirin EC  81 mg Oral Daily   atorvastatin  40 mg Oral q1800   clopidogrel  75 mg Oral Daily   dexamethasone (DECADRON) injection  4 mg Intravenous Q24H   enoxaparin (LOVENOX) injection  40 mg Subcutaneous Q24H   folic acid  1 mg Oral Daily   lamoTRIgine  100 mg Oral Daily   levothyroxine  100 mcg Oral Q0600   pantoprazole  40 mg Oral Daily    spironolactone  50 mg Oral Daily   venlafaxine XR  150 mg Oral Daily   Continuous Infusions:     LOS: 6 days        Charise Killian, MD [facility] Pager [phone]  If 7PM-7AM, please contact night-coverage [email or web address] 02/02/2023, 8:14 AM

## 2023-02-02 NOTE — Progress Notes (Signed)
PULMONOLOGY         Date: 02/02/2023,   MRN# [id number] [name] 03-27-1946     AdmissionWeight: 66.1 kg                 CurrentWeight: 66.1 kg  Referring provider: Dr Joylene Igo   CHIEF COMPLAINT:   Acute on chronic hypoxemic respiratory failure   HISTORY OF PRESENT ILLNESS    [name] is a 77 year old female with history of hypothyroid, anxiety, hyperlipidemia, hypertension, GERD, depression, history of heart failure with preserved ejection fraction, chronic respiratory failure on 2 L nasal cannula at baseline, rheumatoid arthritis, memory difficulty, history of stroke, who presents emergency department for chief concerns of shortness of breath.She requied 10L/min HFNC.  She had hypokalemia on BMP.  She had elevated BNP. She had CTPE with no PE and findig of PNA.  PCCM for further evaluation and management.    01/31/23- patient improved , now on 3L/min.  She's lucid awake alert.  Lung exam is better.  She urinated and diuresed well overnight.  CRP is markedly elevated due to Acute exacerbation of COPD.  She is on dexamethasone and is improved.   02/01/23- patient is improved s/p diuresis.  She's on 2L/min.  She has severe fatigue and is on lamictal and effexor which are centrally acting meds.    02/02/23 patient seen at bedside, husband at bedside.  She's on 2L/min.  She gets out of bed 4 times daily to bathroom and once with PT. She's getting close to baseline.     PAST MEDICAL HISTORY   Past Medical History:  Diagnosis Date   Collagen vascular disease (HCC)    Depression    Hyperlipidemia    Muscle pain    Neuropathy    RA (rheumatoid arthritis) (HCC)    Thyroid disease    Vitamin B 12 deficiency      SURGICAL HISTORY   Past Surgical History:  Procedure Laterality Date   ABDOMINAL HYSTERECTOMY     BREAST BIOPSY Left    2 benign biopsies   ECTOPIC PREGNANCY SURGERY     KNEE SURGERY Right    LAMINECTOMY     leg vein stripping Bilateral     TONSILLECTOMY       FAMILY HISTORY   Family History  Problem Relation Age of Onset   COPD Father    Cancer Father    Breast cancer Cousin    Breast cancer Maternal Aunt    Breast cancer Maternal Aunt      SOCIAL HISTORY   Social History   Tobacco Use   Smoking status: Never    Passive exposure: Never   Smokeless tobacco: Never     MEDICATIONS    Home Medication:    Current Medication:  Current Facility-Administered Medications:    acetaminophen (TYLENOL) tablet 650 mg, 650 mg, Oral, Q6H PRN, 650 mg at 02/02/23 0717 **OR** acetaminophen (TYLENOL) suppository 650 mg, 650 mg, Rectal, Q6H PRN, Cox, Amy N, DO   aspirin EC tablet 81 mg, 81 mg, Oral, Daily, Cox, Amy N, DO, 81 mg at 02/02/23 1011   atorvastatin (LIPITOR) tablet 40 mg, 40 mg, Oral, q1800, Cox, Amy N, DO, 40 mg at 02/01/23 1718   clopidogrel (PLAVIX) tablet 75 mg, 75 mg, Oral, Daily, Cox, Amy N, DO, 75 mg at 02/02/23 1011   dexamethasone (DECADRON) injection 4 mg, 4 mg, Intravenous, Q24H, Vickye Astorino, MD, 4 mg at 02/01/23 1349   enoxaparin (LOVENOX) injection 40  mg, 40 mg, Subcutaneous, Q24H, Charise Killian, MD, 40 mg at 02/01/23 1718   folic acid (FOLVITE) tablet 1 mg, 1 mg, Oral, Daily, Cox, Amy N, DO, 1 mg at 02/02/23 1011   ipratropium-albuterol (DUONEB) 0.5-2.5 (3) MG/3ML nebulizer solution 3 mL, 3 mL, Nebulization, Q4H PRN, Andris Baumann, MD, 3 mL at 02/02/23 0805   lamoTRIgine (LAMICTAL) tablet 100 mg, 100 mg, Oral, Daily, Cox, Amy N, DO, 100 mg at 02/02/23 1011   levothyroxine (SYNTHROID) tablet 100 mcg, 100 mcg, Oral, Q0600, Cox, Amy N, DO, 100 mcg at 02/02/23 0717   pantoprazole (PROTONIX) EC tablet 40 mg, 40 mg, Oral, Daily, Cox, Amy N, DO, 40 mg at 02/02/23 1011   senna-docusate (Senokot-S) tablet 1 tablet, 1 tablet, Oral, QHS PRN, Cox, Amy N, DO, 1 tablet at 02/01/23 2841   spironolactone (ALDACTONE) tablet 50 mg, 50 mg, Oral, Daily, Karna Christmas, Gildo Crisco, MD, 50 mg at 02/02/23 1011    venlafaxine XR (EFFEXOR-XR) 24 hr capsule 150 mg, 150 mg, Oral, Daily, Cox, Amy N, DO, 150 mg at 02/02/23 1011    ALLERGIES   Codeine and Demerol [meperidine]     REVIEW OF SYSTEMS    Review of Systems:  Gen:  Denies  fever, sweats, chills weigh loss  HEENT: Denies blurred vision, double vision, ear pain, eye pain, hearing loss, nose bleeds, sore throat Cardiac:  No dizziness, chest pain or heaviness, chest tightness,edema Resp:   reports dyspnea chronically  Gi: Denies swallowing difficulty, stomach pain, nausea or vomiting, diarrhea, constipation, bowel incontinence Gu:  Denies bladder incontinence, burning urine Ext:   Denies Joint pain, stiffness or swelling Skin: Denies  skin rash, easy bruising or bleeding or hives Endoc:  Denies polyuria, polydipsia , polyphagia or weight change Psych:   Denies depression, insomnia or hallucinations   Other:  All other systems negative   VS: BP (!) 149/61 (BP Location: Right Arm)   Pulse 71   Temp 98.3 F (36.8 C) (Oral)   Resp 18   Ht 5\' 7"  (1.702 m)   Wt 66.1 kg   SpO2 97%   BMI 22.82 kg/m      PHYSICAL EXAM    GENERAL:NAD, no fevers, chills, no weakness no fatigue HEAD: Normocephalic, atraumatic.  EYES: Pupils equal, round, reactive to light. Extraocular muscles intact. No scleral icterus.  MOUTH: Moist mucosal membrane. Dentition intact. No abscess noted.  EAR, NOSE, THROAT: Clear without exudates. No external lesions.  NECK: Supple. No thyromegaly. No nodules. No JVD.  PULMONARY: decreased breath sounds with mild rhonchi worse at bases bilaterally.  CARDIOVASCULAR: S1 and S2. Regular rate and rhythm. No murmurs, rubs, or gallops. No edema. Pedal pulses 2+ bilaterally.  GASTROINTESTINAL: Soft, nontender, nondistended. No masses. Positive bowel sounds. No hepatosplenomegaly.  MUSCULOSKELETAL: No swelling, clubbing, or edema. Range of motion full in all extremities.  NEUROLOGIC: Cranial nerves II through XII are  intact. No gross focal neurological deficits. Sensation intact. Reflexes intact.  SKIN: No ulceration, lesions, rashes, or cyanosis. Skin warm and dry. Turgor intact.  PSYCHIATRIC: Mood, affect within normal limits. The patient is awake, alert and oriented x 3. Insight, judgment intact.       IMAGING    Narrative & Impression  CLINICAL DATA:  High probability for PE. History of pulmonary fibrosis and emphysema.   EXAM: CT ANGIOGRAPHY CHEST WITH CONTRAST   TECHNIQUE: Multidetector CT imaging of the chest was performed using the standard protocol during bolus administration of intravenous contrast. Multiplanar CT image reconstructions and  MIPs were obtained to evaluate the vascular anatomy.   RADIATION DOSE REDUCTION: This exam was performed according to the departmental dose-optimization program which includes automated exposure control, adjustment of the mA and/or kV according to patient size and/or use of iterative reconstruction technique.   CONTRAST:  75mL OMNIPAQUE IOHEXOL 350 MG/ML SOLN   COMPARISON:  Chest CT 09/23/2018   FINDINGS: Cardiovascular: Heart is enlarged. Aorta is normal in size. There are atherosclerotic calcifications of the aorta. There is adequate opacification of the pulmonary arteries to the segmental level. There is no evidence for pulmonary embolism.   Mediastinum/Nodes: There is an enlarged precarinal lymph node measuring 12 mm, unchanged. There is an enlarged right hilar lymph node measuring 11 mm, new from prior. Visualized esophagus and thyroid gland are within normal limits.   Lungs/Pleura: There is a trace right pleural effusion similar to prior. Severe emphysema again noted. Chronic appearing interstitial changes and bronchiectasis are again noted in both lower lobes. There is new right lower lobe peribronchial wall thickening. There are new patchy ground-glass opacities in the inferior upper lobes, right middle lobe and minimally in the  lower lobes. There is no evidence for pneumothorax.   Upper Abdomen: No acute abnormality.   Musculoskeletal: No chest wall abnormality. No acute or significant osseous findings.   Review of the MIP images confirms the above findings.   IMPRESSION: 1. No evidence for pulmonary embolism. 2. New patchy ground-glass opacities in the inferior upper lobes, right middle lobe and minimally in the lower lobes worrisome for multifocal pneumonia. 3. New right lower lobe peribronchial wall thickening worrisome for bronchitis. 4. Stable trace right pleural effusion. 5. Stable chronic interstitial changes and bronchiectasis in the lower lobes. 6. Stable mediastinal lymphadenopathy. New right hilar lymphadenopathy.   Aortic Atherosclerosis (ICD10-I70.0) and Emphysema (ICD10-J43.9).     Electronically Signed   By: Darliss Cheney M.D.    ASSESSMENT/PLAN   Acute on chronic hypoxemic respiratory failure Patient is currently improved on 4L/min [place] -due to acute exacerbation of COPD with RA related pulmonary fibrosis -RVP is negative but she seems to have pneumonia  - may be false negative COVID strain -treating with dexamethasone 4mg  IV due to elevated crp with hx of RA -nebulizer therapy  -IS and flutter valve for chest PT [facility] -OOB to chair    Combined pulmonary fibrosis and emphysema (CPFE) - continue therapy as above   Thank you for allowing me to participate in the care of this patient.   Patient/Family are satisfied with care plan and all questions have been answered.    Provider disclosure: Patient with at least one acute or chronic illness or injury that poses a threat to life or bodily function and is being managed actively during this encounter.  All of the below services have been performed independently by signing provider:  review of prior documentation from internal and or external health records.  Review of previous and current lab results.  Interview and  comprehensive assessment during patient visit today. Review of current and previous chest radiographs/CT scans. Discussion of management and test interpretation with health care team and patient/family.   This document was prepared using Dragon voice recognition software and may include unintentional dictation errors.     Vida Rigger, M.D.  Division of Pulmonary & Critical Care Medicine

## 2023-02-03 DIAGNOSIS — J9601 Acute respiratory failure with hypoxia: Secondary | ICD-10-CM | POA: Diagnosis not present

## 2023-02-03 LAB — C-REACTIVE PROTEIN: CRP: 2.1 mg/dL — ABNORMAL HIGH (ref <1.0)

## 2023-02-03 MED ORDER — MUSCLE RUB 10-15 % EX CREA
1.0000 | TOPICAL_CREAM | CUTANEOUS | Status: DC | PRN
  Administered 2023-02-03: 1 via TOPICAL
  Filled 2023-02-03: qty 85

## 2023-02-03 MED ORDER — ORAL CARE MOUTH RINSE: 15.0000 mL | OROMUCOSAL | Status: DC | PRN

## 2023-02-03 MED ORDER — ACETAMINOPHEN 325 MG PO TABS
650.0000 mg | ORAL_TABLET | Freq: Once | ORAL | Status: AC
  Administered 2023-02-03: 650 mg via ORAL
  Filled 2023-02-03: qty 2

## 2023-02-03 NOTE — Progress Notes (Signed)
PULMONOLOGY         Date: 02/03/2023,   MRN# [id number] [name] 11-06-1945     AdmissionWeight: 66.1 kg                 CurrentWeight: 66.1 kg  Referring provider: Dr Joylene Igo   CHIEF COMPLAINT:   Acute on chronic hypoxemic respiratory failure   HISTORY OF PRESENT ILLNESS    Ms. [name] is a 77 year old female with history of hypothyroid, anxiety, hyperlipidemia, hypertension, GERD, depression, history of heart failure with preserved ejection fraction, chronic respiratory failure on 2 L nasal cannula at baseline, rheumatoid arthritis, memory difficulty, history of stroke, who presents emergency department for chief concerns of shortness of breath.She requied 10L/min HFNC.  She had hypokalemia on BMP.  She had elevated BNP. She had CTPE with no PE and findig of PNA.  PCCM for further evaluation and management.    01/31/23- patient improved , now on 3L/min.  She's lucid awake alert.  Lung exam is better.  She urinated and diuresed well overnight.  CRP is markedly elevated due to Acute exacerbation of COPD.  She is on dexamethasone and is improved.   02/01/23- patient is improved s/p diuresis.  She's on 2L/min.  She has severe fatigue and is on lamictal and effexor which are centrally acting meds.    02/02/23 patient seen at bedside, husband at bedside.  She's on 2L/min.  She gets out of bed 4 times daily to bathroom and once with PT. She's getting close to baseline.     02/03/23- patient on 2L/min eating breakfast, no acute events overnight. For rehab today if accepted  PAST MEDICAL HISTORY   Past Medical History:  Diagnosis Date   Collagen vascular disease (HCC)    Depression    Hyperlipidemia    Muscle pain    Neuropathy    RA (rheumatoid arthritis) (HCC)    Thyroid disease    Vitamin B 12 deficiency      SURGICAL HISTORY   Past Surgical History:  Procedure Laterality Date   ABDOMINAL HYSTERECTOMY     BREAST BIOPSY Left    2 benign biopsies    ECTOPIC PREGNANCY SURGERY     KNEE SURGERY Right    LAMINECTOMY     leg vein stripping Bilateral    TONSILLECTOMY       FAMILY HISTORY   Family History  Problem Relation Age of Onset   COPD Father    Cancer Father    Breast cancer Cousin    Breast cancer Maternal Aunt    Breast cancer Maternal Aunt      SOCIAL HISTORY   Social History   Tobacco Use   Smoking status: Never    Passive exposure: Never   Smokeless tobacco: Never     MEDICATIONS    Home Medication:    Current Medication:  Current Facility-Administered Medications:    aspirin EC tablet 81 mg, 81 mg, Oral, Daily, Cox, Amy N, DO, 81 mg at 02/03/23 0839   atorvastatin (LIPITOR) tablet 40 mg, 40 mg, Oral, q1800, Cox, Amy N, DO, 40 mg at 02/02/23 1715   clopidogrel (PLAVIX) tablet 75 mg, 75 mg, Oral, Daily, Cox, Amy N, DO, 75 mg at 02/03/23 0839   dexamethasone (DECADRON) injection 4 mg, 4 mg, Intravenous, Q24H, Vikas Wegmann, MD, 4 mg at 02/02/23 1316   enoxaparin (LOVENOX) injection 40 mg, 40 mg, Subcutaneous, Q24H, Charise Killian, MD, 40 mg at 02/02/23 1715  folic acid (FOLVITE) tablet 1 mg, 1 mg, Oral, Daily, Cox, Amy N, DO, 1 mg at 02/03/23 0839   ipratropium-albuterol (DUONEB) 0.5-2.5 (3) MG/3ML nebulizer solution 3 mL, 3 mL, Nebulization, Q4H PRN, Andris Baumann, MD, 3 mL at 02/02/23 0805   lamoTRIgine (LAMICTAL) tablet 100 mg, 100 mg, Oral, Daily, Cox, Amy N, DO, 100 mg at 02/03/23 0840   levothyroxine (SYNTHROID) tablet 100 mcg, 100 mcg, Oral, Q0600, Cox, Amy N, DO, 100 mcg at 02/03/23 1610   Muscle Rub CREA 1 Application, 1 Application, Topical, PRN, Charise Killian, MD   Oral care mouth rinse, 15 mL, Mouth Rinse, PRN, Charise Killian, MD   pantoprazole (PROTONIX) EC tablet 40 mg, 40 mg, Oral, Daily, Cox, Amy N, DO, 40 mg at 02/03/23 9604   senna-docusate (Senokot-S) tablet 1 tablet, 1 tablet, Oral, QHS PRN, Cox, Amy N, DO, 1 tablet at 02/01/23 5409   spironolactone (ALDACTONE)  tablet 50 mg, 50 mg, Oral, Daily, Vida Rigger, MD, 50 mg at 02/03/23 0839   venlafaxine XR (EFFEXOR-XR) 24 hr capsule 150 mg, 150 mg, Oral, Daily, Cox, Amy N, DO, 150 mg at 02/03/23 8119    ALLERGIES   Codeine and Demerol [meperidine]     REVIEW OF SYSTEMS    Review of Systems:  Gen:  Denies  fever, sweats, chills weigh loss  HEENT: Denies blurred vision, double vision, ear pain, eye pain, hearing loss, nose bleeds, sore throat Cardiac:  No dizziness, chest pain or heaviness, chest tightness,edema Resp:   reports dyspnea chronically  Gi: Denies swallowing difficulty, stomach pain, nausea or vomiting, diarrhea, constipation, bowel incontinence Gu:  Denies bladder incontinence, burning urine Ext:   Denies Joint pain, stiffness or swelling Skin: Denies  skin rash, easy bruising or bleeding or hives Endoc:  Denies polyuria, polydipsia , polyphagia or weight change Psych:   Denies depression, insomnia or hallucinations   Other:  All other systems negative   VS: BP (!) 172/84   Pulse 75   Temp 97.8 F (36.6 C)   Resp 18   Ht 5\' 7"  (1.702 m)   Wt 66.1 kg   SpO2 98%   BMI 22.82 kg/m      PHYSICAL EXAM    GENERAL:NAD, no fevers, chills, no weakness no fatigue HEAD: Normocephalic, atraumatic.  EYES: Pupils equal, round, reactive to light. Extraocular muscles intact. No scleral icterus.  MOUTH: Moist mucosal membrane. Dentition intact. No abscess noted.  EAR, NOSE, THROAT: Clear without exudates. No external lesions.  NECK: Supple. No thyromegaly. No nodules. No JVD.  PULMONARY: decreased breath sounds with mild rhonchi worse at bases bilaterally.  CARDIOVASCULAR: S1 and S2. Regular rate and rhythm. No murmurs, rubs, or gallops. No edema. Pedal pulses 2+ bilaterally.  GASTROINTESTINAL: Soft, nontender, nondistended. No masses. Positive bowel sounds. No hepatosplenomegaly.  MUSCULOSKELETAL: No swelling, clubbing, or edema. Range of motion full in all extremities.   NEUROLOGIC: Cranial nerves II through XII are intact. No gross focal neurological deficits. Sensation intact. Reflexes intact.  SKIN: No ulceration, lesions, rashes, or cyanosis. Skin warm and dry. Turgor intact.  PSYCHIATRIC: Mood, affect within normal limits. The patient is awake, alert and oriented x 3. Insight, judgment intact.       IMAGING    Narrative & Impression  CLINICAL DATA:  High probability for PE. History of pulmonary fibrosis and emphysema.   EXAM: CT ANGIOGRAPHY CHEST WITH CONTRAST   TECHNIQUE: Multidetector CT imaging of the chest was performed using the standard protocol during bolus  administration of intravenous contrast. Multiplanar CT image reconstructions and MIPs were obtained to evaluate the vascular anatomy.   RADIATION DOSE REDUCTION: This exam was performed according to the departmental dose-optimization program which includes automated exposure control, adjustment of the mA and/or kV according to patient size and/or use of iterative reconstruction technique.   CONTRAST:  75mL OMNIPAQUE IOHEXOL 350 MG/ML SOLN   COMPARISON:  Chest CT 09/23/2018   FINDINGS: Cardiovascular: Heart is enlarged. Aorta is normal in size. There are atherosclerotic calcifications of the aorta. There is adequate opacification of the pulmonary arteries to the segmental level. There is no evidence for pulmonary embolism.   Mediastinum/Nodes: There is an enlarged precarinal lymph node measuring 12 mm, unchanged. There is an enlarged right hilar lymph node measuring 11 mm, new from prior. Visualized esophagus and thyroid gland are within normal limits.   Lungs/Pleura: There is a trace right pleural effusion similar to prior. Severe emphysema again noted. Chronic appearing interstitial changes and bronchiectasis are again noted in both lower lobes. There is new right lower lobe peribronchial wall thickening. There are new patchy ground-glass opacities in the inferior  upper lobes, right middle lobe and minimally in the lower lobes. There is no evidence for pneumothorax.   Upper Abdomen: No acute abnormality.   Musculoskeletal: No chest wall abnormality. No acute or significant osseous findings.   Review of the MIP images confirms the above findings.   IMPRESSION: 1. No evidence for pulmonary embolism. 2. New patchy ground-glass opacities in the inferior upper lobes, right middle lobe and minimally in the lower lobes worrisome for multifocal pneumonia. 3. New right lower lobe peribronchial wall thickening worrisome for bronchitis. 4. Stable trace right pleural effusion. 5. Stable chronic interstitial changes and bronchiectasis in the lower lobes. 6. Stable mediastinal lymphadenopathy. New right hilar lymphadenopathy.   Aortic Atherosclerosis (ICD10-I70.0) and Emphysema (ICD10-J43.9).     Electronically Signed   By: Darliss Cheney M.D.    ASSESSMENT/PLAN   Acute on chronic hypoxemic respiratory failure Patient is currently improved on 4L/min [place] -due to acute exacerbation of COPD with RA related pulmonary fibrosis -RVP is negative but she seems to have pneumonia  - may be false negative COVID strain -treating with dexamethasone 4mg  IV due to elevated crp with hx of RA -nebulizer therapy  -IS and flutter valve for chest PT Metaneb -OOB to chair    Combined pulmonary fibrosis and emphysema (CPFE) - continue therapy as above   Thank you for allowing me to participate in the care of this patient.   Patient/Family are satisfied with care plan and all questions have been answered.    Provider disclosure: Patient with at least one acute or chronic illness or injury that poses a threat to life or bodily function and is being managed actively during this encounter.  All of the below services have been performed independently by signing provider:  review of prior documentation from internal and or external health records.  Review of  previous and current lab results.  Interview and comprehensive assessment during patient visit today. Review of current and previous chest radiographs/CT scans. Discussion of management and test interpretation with health care team and patient/family.   This document was prepared using Dragon voice recognition software and may include unintentional dictation errors.     Vida Rigger, M.D.  Division of Pulmonary & Critical Care Medicine

## 2023-02-03 NOTE — Progress Notes (Signed)
Occupational Therapy Treatment Patient Details Name: MINHA FULCO MRN: [id number] DOB: Jan 17, 1946 Today's Date: 02/03/2023   History of present illness 77 y/o female presented to ED on 01/27/23 for SOB. PMH: depression, anxiety, heart failure, chronic respiratory failure on 2L, RA, memory difficulty, hx of CVA   OT comments  Pt is seated in recliner on arrival. Pleasant and agreeable to OT session. Reports pain in her feet/toes with weight bearing. Pt performed STS and SPT from recliner<>BSC needing Mod A and verb cues for hand placement and safety with increased time to advance feet. Pt required Max/Total assist for hygiene and Max A for LB dressing. 02 removed briefly d/t for transfer and 02 drop to 86% at lowest with quick improvement to 92% on 2L. Pt returned to recliner with all needs in place and will cont to require skilled acute OT services to maximize his safety and IND to return to PLOF.       If plan is discharge home, recommend the following:  A lot of help with walking and/or transfers;A lot of help with bathing/dressing/bathroom;Assistance with cooking/housework;Help with stairs or ramp for entrance;Assist for transportation   Equipment Recommendations  None recommended by OT    Recommendations for Other Services      Precautions / Restrictions Restrictions Weight Bearing Restrictions: No       Mobility Bed Mobility               General bed mobility comments: NT up in recliner    Transfers Overall transfer level: Needs assistance   Transfers: Bed to chair/wheelchair/BSC, Sit to/from Stand Sit to Stand: From elevated surface, Mod assist           General transfer comment: Mod A for STS from recliner x2 attempts to reach upright stand and Mod A for SPT to recliner with extra time and cueing for hand placement     Balance Overall balance assessment: Needs assistance Sitting-balance support: Feet supported, Bilateral upper extremity supported Sitting  balance-Leahy Scale: Fair     Standing balance support: Bilateral upper extremity supported, Reliant on assistive device for balance Standing balance-Leahy Scale: Poor Standing balance comment: needs Min/Mod A to maintain balance in standing                           ADL either performed or assessed with clinical judgement   ADL Overall ADL's : Needs assistance/impaired                     Lower Body Dressing: Maximal assistance;Sit to/from stand   Toilet Transfer: Moderate assistance;Rolling walker (2 wheels) Toilet Transfer Details (indicate cue type and reason): Mod A with increased time Toileting- Clothing Manipulation and Hygiene: Maximal assistance;Total assistance;Sit to/from stand              Extremity/Trunk Assessment         Cervical / Trunk Assessment Cervical / Trunk Assessment: Kyphotic    Vision       Perception     Praxis      Cognition Arousal: Alert Behavior During Therapy: Flat affect Overall Cognitive Status: Within Functional Limits for tasks assessed                                          Exercises      Shoulder Instructions  General Comments      Pertinent Vitals/ Pain       Pain Assessment Pain Assessment: Faces Faces Pain Scale: Hurts little more Pain Location: feet Pain Descriptors / Indicators: Aching, Tender, Sore Pain Intervention(s): Monitored during session  Home Living                                          Prior Functioning/Environment              Frequency  Min 1X/week        Progress Toward Goals  OT Goals(current goals can now be found in the care plan section)  Progress towards OT goals: Progressing toward goals  Acute Rehab OT Goals Patient Stated Goal: improve strength OT Goal Formulation: With patient Time For Goal Achievement: 02/14/23 Potential to Achieve Goals: Good  Plan      Co-evaluation                  AM-PAC OT "6 Clicks" Daily Activity     Outcome Measure   Help from another person eating meals?: A Little Help from another person taking care of personal grooming?: A Little Help from another person toileting, which includes using toliet, bedpan, or urinal?: A Lot Help from another person bathing (including washing, rinsing, drying)?: A Lot Help from another person to put on and taking off regular upper body clothing?: A Lot Help from another person to put on and taking off regular lower body clothing?: A Lot 6 Click Score: 14    End of Session Equipment Utilized During Treatment: Oxygen  OT Visit Diagnosis: Other abnormalities of gait and mobility (R26.89);Unsteadiness on feet (R26.81);History of falling (Z91.81);Muscle weakness (generalized) (M62.81)   Activity Tolerance Patient tolerated treatment well   Patient Left with call bell/phone within reach;in chair;with chair alarm set;with nursing/sitter in room   Nurse Communication Mobility status        Time: 1308-6578 OT Time Calculation (min): 25 min  Charges: OT General Charges $OT Visit: 1 Visit OT Treatments $Self Care/Home Management : 23-37 mins  Rishik Tubby, OTR/L  02/03/23, 4:11 PM  Lochlin Eppinger E Audry Pecina 02/03/2023, 4:09 PM

## 2023-02-03 NOTE — Progress Notes (Signed)
PROGRESS NOTE    [name]  JXB:[id number] DOB: September 11, 1945 DOA: 01/27/2023 PCP: Lynnea Ferrier, MD   Assessment & Plan:   Principal Problem:   Acute hypoxemic respiratory failure (HCC) Active Problems:   Rheumatoid arthritis with rheumatoid factor (HCC)   Elevated troponin   Hyperlipidemia   Hypothyroidism due to acquired atrophy of thyroid   Immunosuppression (HCC)   Pulmonary fibrosis (HCC)   Recurrent major depressive disorder, in partial remission (HCC)   Sleep apnea   Hypertension   History of stroke  Assessment and Plan: Acute on chronic hypoxic respiratory failure: likely secondary to CAP. Completed abx course. Continue on bronchodilators, steroids & encourage incentive spirometry. D-dimer was elevated. CTA chest shows multifocal pneumonia but no PE. CRP was elevated, resp viral panel was neg. Pulmon following and recs apprec. Hx of pulmon fibrosis, emphysema & bronchiectasis. Continue on supplemental oxygen and back to baseline oxygen level, 2L [place].   CAP: completed abx course. Continue on steroids, bronchodilators, encourage incentive spirometry as per pulmon. Pulmon following and recs apprec   Possible acute on chronic diastolic CHF: s/p IV lasix x 3. Monitor I/Os    Sleep apnea: does not use CPAP    Recurrent major depressive disorder: in partial remission. Continue on venlafaxine   Hypothyroidism: continue on levothyroxine     HLD: continue on statin    Elevated troponin: likely secondary to demand ischemia. No chest pain. Trending down now. D/c IV heparin drip. CTA chest was neg for PE   Rheumatoid arthritis: on methotrexate weekly         DVT prophylaxis: lovenox Code Status: full  Family Communication: Disposition Plan: likely d/c to SNF   Level of care: Telemetry Cardiac  Status is: Inpatient Remains inpatient appropriate because: medically stable. Waiting on SNF placement and insurance auth as per CM     Consultants:  Pulmon    Procedures:   Antimicrobials:  Subjective: Pt c/o malaise   Objective: Vitals:   02/03/23 0301 02/03/23 0757 02/03/23 0811 02/03/23 1152  BP: (!) 154/82 (!) 172/84  (!) 148/75  Pulse: 78 75  73  Resp: 18   20  Temp: 97.9 F (36.6 C) 97.8 F (36.6 C)  97.8 F (36.6 C)  TempSrc:    Oral  SpO2: 94% 97% 98% 96%  Weight:      Height:       No intake or output data in the 24 hours ending 02/03/23 1513  Filed Weights   01/27/23 1911  Weight: 66.1 kg    Examination:  General exam: appears comfortable   Respiratory system: diminished breath sounds b/l  Cardiovascular system: S1/S2+. No rubs or clicks  Gastrointestinal system: abd is soft, NT, ND & normal bowel sounds  Central nervous system: alert & awake. Moves all extremities  Psychiatry: judgement and insight improved. Appropriate mood and affect    Data Reviewed: I have personally reviewed following labs and imaging studies  CBC: Recent Labs  Lab 01/27/23 1915 01/28/23 0500 01/29/23 0535 01/30/23 0431  WBC 11.6* 4.9 6.1 5.2  NEUTROABS 6.6  --   --   --   HGB 10.8* 9.6* 9.7* 10.2*  HCT 33.3* 29.7* 30.5* 30.9*  MCV 105.4* 104.9* 106.3* 101.6*  PLT 264 201 248 255   Basic Metabolic Panel: Recent Labs  Lab 01/29/23 0535 01/30/23 0431 01/31/23 0441 02/01/23 0521 02/02/23 0430  NA 139 137 139 140 142  K 3.5 3.0* 4.3 4.1 4.3  CL 106 102 103 105  108  CO2 26 27 28 27 27   GLUCOSE 92 89 120* 102* 102*  BUN 29* 14 13 18 16   CREATININE 1.06* 0.67 0.61 0.77 0.74  CALCIUM 7.9* 8.1* 8.4* 8.5* 8.7*  MG 2.2 2.0 2.3 2.1 2.0   GFR: Estimated Creatinine Clearance: 56.4 mL/min (by C-G formula based on SCr of 0.74 mg/dL). Liver Function Tests: Recent Labs  Lab 01/27/23 1915  AST 24  ALT 16  ALKPHOS 76  BILITOT 1.4*  PROT 7.4  ALBUMIN 3.6   No results for input(s): "LIPASE", "AMYLASE" in the last 168 hours. No results for input(s): "AMMONIA" in the last 168 hours. Coagulation Profile: Recent Labs  Lab  01/28/23 0025  INR 1.2   Cardiac Enzymes: No results for input(s): "CKTOTAL", "CKMB", "CKMBINDEX", "TROPONINI" in the last 168 hours. BNP (last 3 results) No results for input(s): "PROBNP" in the last 8760 hours. HbA1C: No results for input(s): "HGBA1C" in the last 72 hours. CBG: No results for input(s): "GLUCAP" in the last 168 hours. Lipid Profile: No results for input(s): "CHOL", "HDL", "LDLCALC", "TRIG", "CHOLHDL", "LDLDIRECT" in the last 72 hours. Thyroid Function Tests: No results for input(s): "TSH", "T4TOTAL", "FREET4", "T3FREE", "THYROIDAB" in the last 72 hours. Anemia Panel: No results for input(s): "VITAMINB12", "FOLATE", "FERRITIN", "TIBC", "IRON", "RETICCTPCT" in the last 72 hours. Sepsis Labs: Recent Labs  Lab 01/27/23 2258 01/28/23 0025 01/28/23 0031 01/28/23 0500 01/29/23 0535  PROCALCITON  --  5.71  --  8.05 8.86  LATICACIDVEN 1.9  --  2.0*  --   --     Recent Results (from the past 240 hour(s))  Blood Culture (routine x 2)     Status: None   Collection Time: 01/27/23  7:22 PM   Specimen: BLOOD  Result Value Ref Range Status   Specimen Description BLOOD RIGHT ASSIST CONTROL  Final   Special Requests   Final    BOTTLES DRAWN AEROBIC AND ANAEROBIC Blood Culture results may not be optimal due to an excessive volume of blood received in culture bottles   Culture   Final    NO GROWTH 5 DAYS Performed at [facility], [address] [place], [place] [address]    Report Status 02/01/2023 FINAL  Final  Blood Culture (routine x 2)     Status: None   Collection Time: 01/27/23 10:58 PM   Specimen: BLOOD  Result Value Ref Range Status   Specimen Description BLOOD BLOOD RIGHT ARM  Final   Special Requests   Final    BOTTLES DRAWN AEROBIC AND ANAEROBIC Blood Culture adequate volume   Culture   Final    NO GROWTH 5 DAYS Performed at [facility], [address] [place], [place] [address]    Report Status 02/01/2023 FINAL  Final  Resp  panel by RT-PCR (RSV, Flu A&B, Covid) Anterior Nasal Swab     Status: None   Collection Time: 01/28/23 12:25 AM   Specimen: Anterior Nasal Swab  Result Value Ref Range Status   SARS Coronavirus 2 by RT PCR NEGATIVE NEGATIVE Final    Comment: (NOTE) SARS-CoV-2 target nucleic acids are NOT DETECTED.  The SARS-CoV-2 RNA is generally detectable in upper respiratory specimens during the acute phase of infection. The lowest concentration of SARS-CoV-2 viral copies this assay can detect is 138 copies/mL. A negative result does not preclude SARS-Cov-2 infection and should not be used as the sole basis for treatment or other patient management decisions. A negative result may occur with  improper specimen  collection/handling, submission of specimen other than nasopharyngeal swab, presence of viral mutation(s) within the areas targeted by this assay, and inadequate number of viral copies(<138 copies/mL). A negative result must be combined with clinical observations, patient history, and epidemiological information. The expected result is Negative.  Fact Sheet for Patients:  BloggerCourse.com  Fact Sheet for Healthcare Providers:  SeriousBroker.it  This test is no t yet approved or cleared by the [place] FDA and  has been authorized for detection and/or diagnosis of SARS-CoV-2 by FDA under an Emergency Use Authorization (EUA). This EUA will remain  in effect (meaning this test can be used) for the duration of the COVID-19 declaration under Section 564(b)(1) of the Act, 21 U.S.C.section 360bbb-3(b)(1), unless the authorization is terminated  or revoked sooner.       Influenza A by PCR NEGATIVE NEGATIVE Final   Influenza B by PCR NEGATIVE NEGATIVE Final    Comment: (NOTE) The Xpert Xpress SARS-CoV-2/FLU/RSV plus assay is intended as an aid in the diagnosis of influenza from Nasopharyngeal swab specimens and should not be used as a  sole basis for treatment. Nasal washings and aspirates are unacceptable for Xpert Xpress SARS-CoV-2/FLU/RSV testing.  Fact Sheet for Patients: BloggerCourse.com  Fact Sheet for Healthcare Providers: SeriousBroker.it  This test is not yet approved or cleared by the [place] FDA and has been authorized for detection and/or diagnosis of SARS-CoV-2 by FDA under an Emergency Use Authorization (EUA). This EUA will remain in effect (meaning this test can be used) for the duration of the COVID-19 declaration under Section 564(b)(1) of the Act, 21 U.S.C. section 360bbb-3(b)(1), unless the authorization is terminated or revoked.     Resp Syncytial Virus by PCR NEGATIVE NEGATIVE Final    Comment: (NOTE) Fact Sheet for Patients: BloggerCourse.com  Fact Sheet for Healthcare Providers: SeriousBroker.it  This test is not yet approved or cleared by the [place] FDA and has been authorized for detection and/or diagnosis of SARS-CoV-2 by FDA under an Emergency Use Authorization (EUA). This EUA will remain in effect (meaning this test can be used) for the duration of the COVID-19 declaration under Section 564(b)(1) of the Act, 21 U.S.C. section 360bbb-3(b)(1), unless the authorization is terminated or revoked.  Performed at [facility], [address] [place], [place] [address]   Respiratory (~20 pathogens) panel by PCR     Status: None   Collection Time: 01/29/23  2:44 PM   Specimen: Nasopharyngeal Swab; Respiratory  Result Value Ref Range Status   Adenovirus NOT DETECTED NOT DETECTED Final   Coronavirus 229E NOT DETECTED NOT DETECTED Final    Comment: (NOTE) The Coronavirus on the Respiratory Panel, DOES NOT test for the novel  Coronavirus (2019 nCoV)    Coronavirus HKU1 NOT DETECTED NOT DETECTED Final   Coronavirus NL63 NOT DETECTED NOT DETECTED Final   Coronavirus  OC43 NOT DETECTED NOT DETECTED Final   Metapneumovirus NOT DETECTED NOT DETECTED Final   Rhinovirus / Enterovirus NOT DETECTED NOT DETECTED Final   Influenza A NOT DETECTED NOT DETECTED Final   Influenza B NOT DETECTED NOT DETECTED Final   Parainfluenza Virus 1 NOT DETECTED NOT DETECTED Final   Parainfluenza Virus 2 NOT DETECTED NOT DETECTED Final   Parainfluenza Virus 3 NOT DETECTED NOT DETECTED Final   Parainfluenza Virus 4 NOT DETECTED NOT DETECTED Final   Respiratory Syncytial Virus NOT DETECTED NOT DETECTED Final   Bordetella pertussis NOT DETECTED NOT DETECTED Final   Bordetella Parapertussis NOT DETECTED NOT DETECTED Final  Chlamydophila pneumoniae NOT DETECTED NOT DETECTED Final   Mycoplasma pneumoniae NOT DETECTED NOT DETECTED Final    Comment: Performed at [facility], [address] [place], [place] [address]         Radiology Studies: No results found.      Scheduled Meds:  aspirin EC  81 mg Oral Daily   atorvastatin  40 mg Oral q1800   clopidogrel  75 mg Oral Daily   dexamethasone (DECADRON) injection  4 mg Intravenous Q24H   enoxaparin (LOVENOX) injection  40 mg Subcutaneous Q24H   folic acid  1 mg Oral Daily   lamoTRIgine  100 mg Oral Daily   levothyroxine  100 mcg Oral Q0600   pantoprazole  40 mg Oral Daily   spironolactone  50 mg Oral Daily   venlafaxine XR  150 mg Oral Daily   Continuous Infusions:     LOS: 7 days        Charise Killian, MD [facility] Pager [phone]  If 7PM-7AM, please contact night-coverage [email or web address] 02/03/2023, 3:13 PM

## 2023-02-03 NOTE — TOC Progression Note (Signed)
Transition of Care [facility]) - Progression Note    Patient Details  Name: [name] MRN: [id number] Date of Birth: 1945-07-27  Transition of Care [facility]) CM/SW Contact  Truddie Hidden, RN Phone Number: 02/03/2023, 3:01 PM  Clinical Narrative:    Spoke with patient and her spouse to give bed offers for [facility] and [facility]. She and spouse are agreeable to [facility]. Patient was advised an Berkley Harvey will be required for SNF  HTA auth started. Spoke with Tammy.    Expected Discharge Plan: Skilled Nursing Facility Barriers to Discharge: Continued Medical Work up  Expected Discharge Plan and Services       Living arrangements for the past 2 months: Single Family Home                                       Social Determinants of Health (SDOH) Interventions SDOH Screenings   Food Insecurity: No Food Insecurity (01/28/2023)  Housing: Low Risk  (01/28/2023)  Transportation Needs: No Transportation Needs (01/28/2023)  Utilities: Not At Risk (01/28/2023)  Financial Resource Strain: Low Risk  (07/01/2022)   Received from [facility], [facility]  Physical Activity: Insufficiently Active (05/23/2022)   Received from [facility], [facility]  Social Connections: Socially Integrated (05/23/2022)   Received from [facility], [facility]  Stress: Stress Concern Present (05/23/2022)   Received from [facility], [facility]  Tobacco Use: Low Risk  (01/28/2023)  Recent Concern: Tobacco Use - Medium Risk (01/10/2023)   Received from [facility]    Readmission Risk Interventions     No data to display

## 2023-02-03 NOTE — Plan of Care (Signed)

## 2023-02-04 DIAGNOSIS — J9601 Acute respiratory failure with hypoxia: Secondary | ICD-10-CM | POA: Diagnosis not present

## 2023-02-04 NOTE — Progress Notes (Signed)
Physical Therapy Treatment Patient Details Name: [name] MRN: [id number] DOB: 1946-02-15 Today's Date: 02/04/2023   History of Present Illness 77 y/o female presented to ED on 01/27/23 for SOB. PMH: depression, anxiety, heart failure, chronic respiratory failure on 2L, RA, memory difficulty, hx of CVA    PT Comments  Patient is agreeable to PT session. She reports being up twice already today. Patient continues to require assistance for bed mobility and transfers. Standing balance is poor with external support required to stand. Pre gait activity performed with taking side steps along edge of bed with assistance with increased time required. Patient will require rehabilitation <3 hours/day after this hospital stay.    If plan is discharge home, recommend the following: A lot of help with walking and/or transfers;A lot of help with bathing/dressing/bathroom;Assistance with cooking/housework;Help with stairs or ramp for entrance;Assist for transportation   Can travel by private vehicle     No  Equipment Recommendations  Rolling walker (2 wheels);BSC/3in1;Wheelchair (measurements PT);Wheelchair cushion (measurements PT)    Recommendations for Other Services       Precautions / Restrictions Precautions Precautions: Fall Restrictions Weight Bearing Restrictions: No     Mobility  Bed Mobility Overal bed mobility: Needs Assistance Bed Mobility: Supine to Sit, Sit to Supine     Supine to sit: Mod assist Sit to supine: Mod assist   General bed mobility comments: increased time and effort required, cues for technique    Transfers Overall transfer level: Needs assistance Equipment used: Rolling walker (2 wheels) Transfers: Sit to/from Stand Sit to Stand: Mod assist           General transfer comment: assistance required for lifting and lowering. cues for technique    Ambulation/Gait             Pre-gait activities: weight shifting faciliation required for  taking 2 small side steps to the left using rolling walker with Min A required. increased time and effort required. unable to ambulate away from the bed this session     Stairs             Wheelchair Mobility     Tilt Bed    Modified Rankin (Stroke Patients Only)       Balance Overall balance assessment: Needs assistance Sitting-balance support: Feet supported, Bilateral upper extremity supported Sitting balance-Leahy Scale: Fair     Standing balance support: Bilateral upper extremity supported, Reliant on assistive device for balance Standing balance-Leahy Scale: Poor Standing balance comment: Mod A initially progressing to Min A with increased standing time                            Cognition Arousal: Alert Behavior During Therapy: Flat affect Overall Cognitive Status: Within Functional Limits for tasks assessed                                          Exercises      General Comments General comments (skin integrity, edema, etc.): activity tolerance is limited by fatiuge, however patient reports she is not fatigued after mobility. mild shortness of breath with activity      Pertinent Vitals/Pain Pain Assessment Pain Assessment: Faces Faces Pain Scale: Hurts a little bit Pain Location: feet with standing Pain Descriptors / Indicators: Discomfort Pain Intervention(s): Monitored during session, Limited activity within patient's tolerance  Home Living                          Prior Function            PT Goals (current goals can now be found in the care plan section) Acute Rehab PT Goals Patient Stated Goal: to go home PT Goal Formulation: With patient/family Time For Goal Achievement: 02/14/23 Potential to Achieve Goals: Fair Progress towards PT goals: Progressing toward goals    Frequency    Min 1X/week      PT Plan      Co-evaluation              AM-PAC PT "6 Clicks" Mobility   Outcome  Measure  Help needed turning from your back to your side while in a flat bed without using bedrails?: A Lot Help needed moving from lying on your back to sitting on the side of a flat bed without using bedrails?: A Lot Help needed moving to and from a bed to a chair (including a wheelchair)?: A Lot Help needed standing up from a chair using your arms (e.g., wheelchair or bedside chair)?: A Lot Help needed to walk in hospital room?: A Lot Help needed climbing 3-5 steps with a railing? : Total 6 Click Score: 11    End of Session Equipment Utilized During Treatment: Oxygen Activity Tolerance: Patient limited by fatigue;Patient tolerated treatment well Patient left: in bed;with call bell/phone within reach;with bed alarm set Nurse Communication: Mobility status PT Visit Diagnosis: Unsteadiness on feet (R26.81);Muscle weakness (generalized) (M62.81);Difficulty in walking, not elsewhere classified (R26.2)     Time: 5621-3086 PT Time Calculation (min) (ACUTE ONLY): 22 min  Charges:    $Therapeutic Activity: 8-22 mins PT General Charges $$ ACUTE PT VISIT: 1 Visit                     Donna Bernard, PT, MPT    Ina Homes 02/04/2023, 12:55 PM

## 2023-02-04 NOTE — Plan of Care (Signed)

## 2023-02-04 NOTE — Progress Notes (Signed)
PROGRESS NOTE   HPI was taken from Dr. Sedalia Muta: [name] is a 77 year old female with history of hypothyroid, anxiety, hyperlipidemia, hypertension, GERD, depression, history of heart failure with preserved ejection fraction, chronic respiratory failure on 2 L nasal cannula at baseline, rheumatoid arthritis, memory difficulty, history of stroke, who presents emergency department for chief concerns of shortness of breath.   Vitals in the ED showed temperature of 98, respiration rate of 32, heart rate of 112, blood pressure 121/73, SpO2 of 95% on 10 L high flow nasal cannula and patient was transitioned to BiPAP.   Serum sodium is 138, potassium 3.8, chloride 103, bicarb 21, BUN of 12, serum creatinine 0.92, EGFR greater than 60, nonfasting blood glucose 198, WBC 11.6, hemoglobin 10.8, platelets of 264.   BNP was elevated at 511.6.  High sensitive troponin was 201 and on repeat was 626.   Portable chest x-ray 1 view has been ordered and pending radiology read.   ED treatment: Aspirin 325 mg p.o. one-time dose, DuoNebs one-time treatment, furosemide 40 mg IV one-time dose, azithromycin 500 mg IV, ceftriaxone 2 g IV. ---------------------------------- At bedside, patient was able to tell me her name, age, location, current calendar year.   Her husband was at bedside.   Patient reports that she started feeling short of breath on Friday and this gradually worsened until Sunday.  Patient reports that she has been having productive cough with yellow sputum.  She denies known sick contacts.    She denies fever, chest pain, abdominal pain, dysuria, hematuria, diarrhea at home.   Her husband reports that she did not come into the hospital on Friday or Saturday because they had a granddaughter wedding.  Husband states she was not that bad during those days until Sunday.   Patient also has a lot of stress lately and she is a Product/process development scientist.   Patient is sleepy at bedside.  Husband endorses that she has  not slept well for the past several days due to the shortness of breath.     [name]  ZOX:[id number] DOB: 11-20-1945 DOA: 01/27/2023 PCP: Lynnea Ferrier, MD   Assessment & Plan:   Principal Problem:   Acute hypoxemic respiratory failure (HCC) Active Problems:   Rheumatoid arthritis with rheumatoid factor (HCC)   Elevated troponin   Hyperlipidemia   Hypothyroidism due to acquired atrophy of thyroid   Immunosuppression (HCC)   Pulmonary fibrosis (HCC)   Recurrent major depressive disorder, in partial remission (HCC)   Sleep apnea   Hypertension   History of stroke  Assessment and Plan: Acute on chronic hypoxic respiratory failure: likely secondary to CAP. Completed abx course. Continue on bronchodilators, steroids & encourage incentive spirometry. D-dimer was elevated. CTA chest shows multifocal pneumonia but no PE. CRP was elevated, resp viral panel was neg. Pulmon following and recs apprec. Hx of pulmon fibrosis, emphysema & bronchiectasis. Continue on supplemental oxygen. Waiting on insurance auth   CAP: completed abx course. Continue on steroids, bronchodilators, encourage incentive spirometry. Pulmon following and recs apprec   Possible acute on chronic diastolic CHF: s/p IV lasix x 3. Monitor I/Os    Sleep apnea: does not use CPAP    Recurrent major depressive disorder: in partial remission. Continue on venlafaxine   Hypothyroidism: continue on levothyroxine     HLD: continue on statin    Elevated troponin: likely secondary to demand ischemia. No chest pain. Trending down now. D/c IV heparin drip. CTA chest was neg for PE   Rheumatoid  arthritis: on methotrexate weekly         DVT prophylaxis: lovenox Code Status: full  Family Communication: Disposition Plan: likely d/c to SNF   Level of care: Telemetry Cardiac  Status is: Inpatient Remains inpatient appropriate because: medically stable. Waiting on insurance auth as per CM     Consultants:   Pulmon   Procedures:   Antimicrobials:  Subjective: Pt c/o fatigue   Objective: Vitals:   02/03/23 2339 02/04/23 0313 02/04/23 0757 02/04/23 1202  BP: (!) 156/84 (!) 164/80 (!) 149/109 136/71  Pulse: 80 72 92 73  Resp: 18 18 18 16   Temp: (!) 97.5 F (36.4 C) (!) 97.5 F (36.4 C) 97.8 F (36.6 C) (!) 97.4 F (36.3 C)  TempSrc: Oral     SpO2: 99% 96% 95% 96%  Weight:      Height:        Intake/Output Summary (Last 24 hours) at 02/04/2023 1528 Last data filed at 02/04/2023 0900 Gross per 24 hour  Intake 400 ml  Output 300 ml  Net 100 ml    Filed Weights   01/27/23 1911  Weight: 66.1 kg    Examination:  General exam: appears calm & comfortable    Respiratory system: decreased breath sounds b/l  Cardiovascular system: S1 &S2+. No rubs or clicks Gastrointestinal system: abd is soft, NT, ND, normal bowel sounds  Central nervous system: alert & awake. Moves all extremities Psychiatry: judgement and insight appears at baseline. Flat mood and affect    Data Reviewed: I have personally reviewed following labs and imaging studies  CBC: Recent Labs  Lab 01/29/23 0535 01/30/23 0431  WBC 6.1 5.2  HGB 9.7* 10.2*  HCT 30.5* 30.9*  MCV 106.3* 101.6*  PLT 248 255   Basic Metabolic Panel: Recent Labs  Lab 01/29/23 0535 01/30/23 0431 01/31/23 0441 02/01/23 0521 02/02/23 0430  NA 139 137 139 140 142  K 3.5 3.0* 4.3 4.1 4.3  CL 106 102 103 105 108  CO2 26 27 28 27 27   GLUCOSE 92 89 120* 102* 102*  BUN 29* 14 13 18 16   CREATININE 1.06* 0.67 0.61 0.77 0.74  CALCIUM 7.9* 8.1* 8.4* 8.5* 8.7*  MG 2.2 2.0 2.3 2.1 2.0   GFR: Estimated Creatinine Clearance: 56.4 mL/min (by C-G formula based on SCr of 0.74 mg/dL). Liver Function Tests: No results for input(s): "AST", "ALT", "ALKPHOS", "BILITOT", "PROT", "ALBUMIN" in the last 168 hours.  No results for input(s): "LIPASE", "AMYLASE" in the last 168 hours. No results for input(s): "AMMONIA" in the last 168  hours. Coagulation Profile: No results for input(s): "INR", "PROTIME" in the last 168 hours.  Cardiac Enzymes: No results for input(s): "CKTOTAL", "CKMB", "CKMBINDEX", "TROPONINI" in the last 168 hours. BNP (last 3 results) No results for input(s): "PROBNP" in the last 8760 hours. HbA1C: No results for input(s): "HGBA1C" in the last 72 hours. CBG: No results for input(s): "GLUCAP" in the last 168 hours. Lipid Profile: No results for input(s): "CHOL", "HDL", "LDLCALC", "TRIG", "CHOLHDL", "LDLDIRECT" in the last 72 hours. Thyroid Function Tests: No results for input(s): "TSH", "T4TOTAL", "FREET4", "T3FREE", "THYROIDAB" in the last 72 hours. Anemia Panel: No results for input(s): "VITAMINB12", "FOLATE", "FERRITIN", "TIBC", "IRON", "RETICCTPCT" in the last 72 hours. Sepsis Labs: Recent Labs  Lab 01/29/23 0535  PROCALCITON 8.86    Recent Results (from the past 240 hour(s))  Blood Culture (routine x 2)     Status: None   Collection Time: 01/27/23  7:22 PM   Specimen:  BLOOD  Result Value Ref Range Status   Specimen Description BLOOD RIGHT ASSIST CONTROL  Final   Special Requests   Final    BOTTLES DRAWN AEROBIC AND ANAEROBIC Blood Culture results may not be optimal due to an excessive volume of blood received in culture bottles   Culture   Final    NO GROWTH 5 DAYS Performed at [facility], [address] [place], [place] [address]    Report Status 02/01/2023 FINAL  Final  Blood Culture (routine x 2)     Status: None   Collection Time: 01/27/23 10:58 PM   Specimen: BLOOD  Result Value Ref Range Status   Specimen Description BLOOD BLOOD RIGHT ARM  Final   Special Requests   Final    BOTTLES DRAWN AEROBIC AND ANAEROBIC Blood Culture adequate volume   Culture   Final    NO GROWTH 5 DAYS Performed at [facility], [address] [place], [place] [address]    Report Status 02/01/2023 FINAL  Final  Resp panel by RT-PCR (RSV, Flu A&B, Covid) Anterior Nasal  Swab     Status: None   Collection Time: 01/28/23 12:25 AM   Specimen: Anterior Nasal Swab  Result Value Ref Range Status   SARS Coronavirus 2 by RT PCR NEGATIVE NEGATIVE Final    Comment: (NOTE) SARS-CoV-2 target nucleic acids are NOT DETECTED.  The SARS-CoV-2 RNA is generally detectable in upper respiratory specimens during the acute phase of infection. The lowest concentration of SARS-CoV-2 viral copies this assay can detect is 138 copies/mL. A negative result does not preclude SARS-Cov-2 infection and should not be used as the sole basis for treatment or other patient management decisions. A negative result may occur with  improper specimen collection/handling, submission of specimen other than nasopharyngeal swab, presence of viral mutation(s) within the areas targeted by this assay, and inadequate number of viral copies(<138 copies/mL). A negative result must be combined with clinical observations, patient history, and epidemiological information. The expected result is Negative.  Fact Sheet for Patients:  BloggerCourse.com  Fact Sheet for Healthcare Providers:  SeriousBroker.it  This test is no t yet approved or cleared by the [place] FDA and  has been authorized for detection and/or diagnosis of SARS-CoV-2 by FDA under an Emergency Use Authorization (EUA). This EUA will remain  in effect (meaning this test can be used) for the duration of the COVID-19 declaration under Section 564(b)(1) of the Act, 21 U.S.C.section 360bbb-3(b)(1), unless the authorization is terminated  or revoked sooner.       Influenza A by PCR NEGATIVE NEGATIVE Final   Influenza B by PCR NEGATIVE NEGATIVE Final    Comment: (NOTE) The Xpert Xpress SARS-CoV-2/FLU/RSV plus assay is intended as an aid in the diagnosis of influenza from Nasopharyngeal swab specimens and should not be used as a sole basis for treatment. Nasal washings and aspirates  are unacceptable for Xpert Xpress SARS-CoV-2/FLU/RSV testing.  Fact Sheet for Patients: BloggerCourse.com  Fact Sheet for Healthcare Providers: SeriousBroker.it  This test is not yet approved or cleared by the [place] FDA and has been authorized for detection and/or diagnosis of SARS-CoV-2 by FDA under an Emergency Use Authorization (EUA). This EUA will remain in effect (meaning this test can be used) for the duration of the COVID-19 declaration under Section 564(b)(1) of the Act, 21 U.S.C. section 360bbb-3(b)(1), unless the authorization is terminated or revoked.     Resp Syncytial Virus by PCR NEGATIVE NEGATIVE Final    Comment: (  NOTE) Fact Sheet for Patients: BloggerCourse.com  Fact Sheet for Healthcare Providers: SeriousBroker.it  This test is not yet approved or cleared by the [place] FDA and has been authorized for detection and/or diagnosis of SARS-CoV-2 by FDA under an Emergency Use Authorization (EUA). This EUA will remain in effect (meaning this test can be used) for the duration of the COVID-19 declaration under Section 564(b)(1) of the Act, 21 U.S.C. section 360bbb-3(b)(1), unless the authorization is terminated or revoked.  Performed at [facility], [address] [place], [place] [address]   Respiratory (~20 pathogens) panel by PCR     Status: None   Collection Time: 01/29/23  2:44 PM   Specimen: Nasopharyngeal Swab; Respiratory  Result Value Ref Range Status   Adenovirus NOT DETECTED NOT DETECTED Final   Coronavirus 229E NOT DETECTED NOT DETECTED Final    Comment: (NOTE) The Coronavirus on the Respiratory Panel, DOES NOT test for the novel  Coronavirus (2019 nCoV)    Coronavirus HKU1 NOT DETECTED NOT DETECTED Final   Coronavirus NL63 NOT DETECTED NOT DETECTED Final   Coronavirus OC43 NOT DETECTED NOT DETECTED Final   Metapneumovirus  NOT DETECTED NOT DETECTED Final   Rhinovirus / Enterovirus NOT DETECTED NOT DETECTED Final   Influenza A NOT DETECTED NOT DETECTED Final   Influenza B NOT DETECTED NOT DETECTED Final   Parainfluenza Virus 1 NOT DETECTED NOT DETECTED Final   Parainfluenza Virus 2 NOT DETECTED NOT DETECTED Final   Parainfluenza Virus 3 NOT DETECTED NOT DETECTED Final   Parainfluenza Virus 4 NOT DETECTED NOT DETECTED Final   Respiratory Syncytial Virus NOT DETECTED NOT DETECTED Final   Bordetella pertussis NOT DETECTED NOT DETECTED Final   Bordetella Parapertussis NOT DETECTED NOT DETECTED Final   Chlamydophila pneumoniae NOT DETECTED NOT DETECTED Final   Mycoplasma pneumoniae NOT DETECTED NOT DETECTED Final    Comment: Performed at [facility], [address] [place], [place] [address]         Radiology Studies: No results found.      Scheduled Meds:  aspirin EC  81 mg Oral Daily   atorvastatin  40 mg Oral q1800   clopidogrel  75 mg Oral Daily   dexamethasone (DECADRON) injection  4 mg Intravenous Q24H   enoxaparin (LOVENOX) injection  40 mg Subcutaneous Q24H   folic acid  1 mg Oral Daily   lamoTRIgine  100 mg Oral Daily   levothyroxine  100 mcg Oral Q0600   pantoprazole  40 mg Oral Daily   spironolactone  50 mg Oral Daily   venlafaxine XR  150 mg Oral Daily   Continuous Infusions:     LOS: 8 days        Charise Killian, MD [facility] Pager [phone]  If 7PM-7AM, please contact night-coverage [email or web address] 02/04/2023, 3:28 PM

## 2023-02-04 NOTE — Care Management Important Message (Signed)
Important Message  Patient Details  Name: [name] MRN: [id number] Date of Birth: 1946-03-22   Important Message Given:  Yes - Medicare IM     Verita Schneiders Leeanna Slaby 02/04/2023, 3:18 PM

## 2023-02-05 DIAGNOSIS — J9601 Acute respiratory failure with hypoxia: Secondary | ICD-10-CM | POA: Diagnosis not present

## 2023-02-05 DIAGNOSIS — Z8673 Personal history of transient ischemic attack (TIA), and cerebral infarction without residual deficits: Secondary | ICD-10-CM | POA: Diagnosis not present

## 2023-02-05 DIAGNOSIS — M6281 Muscle weakness (generalized): Secondary | ICD-10-CM | POA: Diagnosis not present

## 2023-02-05 MED ORDER — TORSEMIDE 20 MG PO TABS: 20.0000 mg | ORAL_TABLET | Freq: Every day | ORAL | Status: DC

## 2023-02-05 NOTE — Discharge Summary (Signed)
[facility] Discharge Summary   Patient: [name] ZOX:[id number]  PCP: Lynnea Ferrier, MD  Date of admission: 01/27/2023   Date of discharge:  02/05/2023     Discharge Diagnoses:  Principal Problem:   Acute hypoxemic respiratory failure (HCC) Active Problems:   Rheumatoid arthritis with rheumatoid factor (HCC)   Elevated troponin   Hyperlipidemia   Hypothyroidism due to acquired atrophy of thyroid   Immunosuppression (HCC)   Pulmonary fibrosis (HCC)   Recurrent major depressive disorder, in partial remission (HCC)   Sleep apnea   Hypertension   History of stroke   Admitted From: Home Disposition:  SNF   Recommendations for Outpatient Follow-up:  PCP: Follow-up with PCP, need to be seen by an MD in 1 to 2 days Follow-up with pulmonologist in 1 week Repeat CBC and BMP after 1 week Follow up LABS/TEST: CBC and BMP after 1 week and chest x-ray after 4 weeks for resolution of pneumonia.   Diet recommendation: Regular diet  Activity: The patient is advised to gradually reintroduce usual activities, as tolerated  Discharge Condition: stable  Code Status: Full code   History of present illness: As per the H and P dictated on admission Hospital Course:  HPI was taken from Dr. Sedalia Muta: [name] is a 77 year old female with history of hypothyroid, anxiety, hyperlipidemia, hypertension, GERD, depression, history of heart failure with preserved ejection fraction, chronic respiratory failure on 2 L nasal cannula at baseline, rheumatoid arthritis, memory difficulty, history of stroke, who presents emergency department for chief concerns of shortness of breath. Vitals in the ED showed temperature of 98, respiration rate of 32, heart rate of 112, blood pressure 121/73, SpO2 of 95% on 10 L high flow nasal cannula and patient was transitioned to BiPAP.  Serum sodium is 138, potassium 3.8, chloride 103, bicarb 21, BUN of 12, serum creatinine 0.92, EGFR greater than 60,  nonfasting blood glucose 198, WBC 11.6, hemoglobin 10.8, platelets of 264. BNP was elevated at 511.6.  High sensitive troponin was 201 and on repeat was 626. Portable chest x-ray: Borderline cardiac enlargement with mild central congestion and probable small pleural effusions. Interstitial and patchy pulmonary opacity, possible superimposed infection/pneumonia   ED treatment: Aspirin 325 mg p.o. one-time dose, DuoNebs one-time treatment, furosemide 40 mg IV one-time dose, azithromycin 500 mg IV, ceftriaxone 2 g IV.  Assessment and Plan:  # Acute on chronic hypoxic respiratory failure: likely secondary to CAP. Completed abx course. Continue on bronchodilators, steroids & encourage incentive spirometry. D-dimer was elevated. CTA chest shows multifocal pneumonia but no PE. CRP was elevated, resp viral panel was neg. pulmonologist was consulted.Hx of pulmon fibrosis, emphysema & bronchiectasis. Continue on supplemental oxygen.  Continue home dose prednisone.  Follow with pulmonary in 1 week. # CAP: completed abx course. Continue on steroids, bronchodilators, encourage incentive spirometry.  Repeat chest x-ray after 4 weeks for resolution of pneumonia. # acute on chronic diastolic CHF: s/p IV lasix x 3, resumed home dose torsemide with potassium supplement.  Repeat 2D echocardiogram as an outpatient when possible. # Sleep apnea: does not use CPAP  # Recurrent major depressive disorder: in partial remission. Continue on venlafaxine  # Hypothyroidism: continue on levothyroxine   # HLD: continue on statin  # Elevated troponin: likely secondary to demand ischemia. No chest pain. Trending down now. D/c IV heparin drip. CTA chest was neg for PE  # Rheumatoid arthritis: on methotrexate weekly   Body mass index is 22.82 kg/m.  Nutrition Interventions:  -  Patient was instructed, not to drive, operate heavy machinery, perform activities at heights, swimming or participation in water activities or provide baby  sitting services while on Pain, Sleep and Anxiety Medications; until her outpatient Physician has advised to do so again.  - Also recommended to not to take more than prescribed Pain, Sleep and Anxiety Medications.  Patient was seen by physical therapy, who recommended Therapy, SNF placement, which was arranged. On the day of the discharge the patient's vitals were stable, and no other acute medical condition were reported by patient. the patient was felt safe to be discharge at [facility].  Consultants: Pulmonary Procedures: None  Discharge Exam: General: Appear in no distress, no Rash; Oral Mucosa Clear, moist. Cardiovascular: S1 and S2 Present, no Murmur, Respiratory: normal respiratory effort, Bilateral Air entry present and no Crackles, no wheezes Abdomen: Bowel Sound present, Soft and no tenderness, no hernia Extremities: no Pedal edema, no calf tenderness Neurology: alert and oriented to time, place, and person affect appropriate.  Filed Weights   01/27/23 1911  Weight: 66.1 kg   Vitals:   02/05/23 0845 02/05/23 1301  BP: 137/80 130/71  Pulse: 68 75  Resp: 20 16  Temp: 97.7 F (36.5 C) 98.1 F (36.7 C)  SpO2: 97% 96%    DISCHARGE MEDICATION: Allergies as of 02/05/2023       Reactions   Codeine Nausea And Vomiting, Other (See Comments)   Demerol [meperidine] Nausea And Vomiting        Medication List     STOP taking these medications    clonazePAM 0.5 MG tablet Commonly known as: KLONOPIN   sulfamethoxazole-trimethoprim 800-160 MG tablet Commonly known as: BACTRIM DS       TAKE these medications    adalimumab 40 MG/0.8ML prefilled syringe Commonly known as: HUMIRA Inject 40 mg into the skin every 14 (fourteen) days.   albuterol 108 (90 Base) MCG/ACT inhaler Commonly known as: VENTOLIN HFA Inhale 1-2 puffs into the lungs every 6 (six) hours as needed for wheezing or shortness of breath.   aspirin EC 81 MG tablet Take 81 mg by mouth daily.    atorvastatin 40 MG tablet Commonly known as: LIPITOR Take 1 tablet (40 mg total) by mouth daily at 6 PM.   clopidogrel 75 MG tablet Commonly known as: PLAVIX Take 1 tablet (75 mg total) by mouth daily.   folic acid 1 MG tablet Commonly known as: FOLVITE Take 1 mg by mouth daily.   ipratropium-albuterol 0.5-2.5 (3) MG/3ML Soln Commonly known as: DUONEB Inhale 3 mLs into the lungs 2 (two) times daily as needed (wheezing or shortness of breath).   lamoTRIgine 100 MG tablet Commonly known as: LAMICTAL Take 100 mg by mouth daily.   levothyroxine 112 MCG tablet Commonly known as: SYNTHROID Take 1 tablet (112 mcg total) by mouth daily.   methotrexate 2.5 MG tablet Commonly known as: RHEUMATREX Take 20 mg by mouth every Saturday.   mirtazapine 15 MG tablet Commonly known as: REMERON Take 15 mg by mouth at bedtime.   nystatin cream Commonly known as: MYCOSTATIN Apply 1 Application topically 2 (two) times daily.   pantoprazole 40 MG tablet Commonly known as: PROTONIX Take 40 mg by mouth.   potassium chloride 8 MEQ tablet Commonly known as: KLOR-CON Take 8 mEq by mouth daily.   predniSONE 5 MG tablet Commonly known as: DELTASONE Take 2.5 mg by mouth daily.   torsemide 20 MG tablet Commonly known as: DEMADEX Take 1 tablet (20 mg total) by  mouth daily. What changed: how much to take   venlafaxine XR 150 MG 24 hr capsule Commonly known as: EFFEXOR-XR Take 150 mg by mouth daily.   Vitamin D (Ergocalciferol) 50000 units Caps Take 1 capsule by mouth once a week.       Allergies  Allergen Reactions   Codeine Nausea And Vomiting and Other (See Comments)   Demerol [Meperidine] Nausea And Vomiting   Discharge Instructions     Call MD for:  difficulty breathing, headache or visual disturbances   Complete by: As directed    Call MD for:  extreme fatigue   Complete by: As directed    Call MD for:  persistant dizziness or light-headedness   Complete by: As  directed    Call MD for:  persistant nausea and vomiting   Complete by: As directed    Call MD for:  severe uncontrolled pain   Complete by: As directed    Call MD for:  temperature >100.4   Complete by: As directed    Diet - low sodium heart healthy   Complete by: As directed    Discharge instructions   Complete by: As directed    Follow-up with PCP, need to be seen by an MD in 1 to 2 days Follow-up with pulmonologist in 1 week Repeat CBC and BMP after 1 week   Increase activity slowly   Complete by: As directed        The results of significant diagnostics from this hospitalization (including imaging, microbiology, ancillary and laboratory) are listed below for reference.    Significant Diagnostic Studies: DG Chest Port 1 View  Result Date: 01/31/2023 CLINICAL DATA:  Atelectasis. EXAM: PORTABLE CHEST 1 VIEW COMPARISON:  January 27, 2023. FINDINGS: Stable cardiomediastinal silhouette. Minimal bibasilar subsegmental atelectasis or scarring is noted. Stable right midlung opacity is noted concerning for atelectasis or scarring. Bony thorax is unremarkable. IMPRESSION: Minimal bibasilar subsegmental atelectasis or scarring. Stable right midlung scarring or atelectasis is noted. Electronically Signed   By: Lupita Raider M.D.   On: 01/31/2023 14:26   CT Angio Chest Pulmonary Embolism (PE) W or WO Contrast  Result Date: 01/29/2023 CLINICAL DATA:  High probability for PE. History of pulmonary fibrosis and emphysema. EXAM: CT ANGIOGRAPHY CHEST WITH CONTRAST TECHNIQUE: Multidetector CT imaging of the chest was performed using the standard protocol during bolus administration of intravenous contrast. Multiplanar CT image reconstructions and MIPs were obtained to evaluate the vascular anatomy. RADIATION DOSE REDUCTION: This exam was performed according to the departmental dose-optimization program which includes automated exposure control, adjustment of the mA and/or kV according to patient  size and/or use of iterative reconstruction technique. CONTRAST:  75mL OMNIPAQUE IOHEXOL 350 MG/ML SOLN COMPARISON:  Chest CT 09/23/2018 FINDINGS: Cardiovascular: Heart is enlarged. Aorta is normal in size. There are atherosclerotic calcifications of the aorta. There is adequate opacification of the pulmonary arteries to the segmental level. There is no evidence for pulmonary embolism. Mediastinum/Nodes: There is an enlarged precarinal lymph node measuring 12 mm, unchanged. There is an enlarged right hilar lymph node measuring 11 mm, new from prior. Visualized esophagus and thyroid gland are within normal limits. Lungs/Pleura: There is a trace right pleural effusion similar to prior. Severe emphysema again noted. Chronic appearing interstitial changes and bronchiectasis are again noted in both lower lobes. There is new right lower lobe peribronchial wall thickening. There are new patchy ground-glass opacities in the inferior upper lobes, right middle lobe and minimally in the lower lobes. There is  no evidence for pneumothorax. Upper Abdomen: No acute abnormality. Musculoskeletal: No chest wall abnormality. No acute or significant osseous findings. Review of the MIP images confirms the above findings. IMPRESSION: 1. No evidence for pulmonary embolism. 2. New patchy ground-glass opacities in the inferior upper lobes, right middle lobe and minimally in the lower lobes worrisome for multifocal pneumonia. 3. New right lower lobe peribronchial wall thickening worrisome for bronchitis. 4. Stable trace right pleural effusion. 5. Stable chronic interstitial changes and bronchiectasis in the lower lobes. 6. Stable mediastinal lymphadenopathy. New right hilar lymphadenopathy. Aortic Atherosclerosis (ICD10-I70.0) and Emphysema (ICD10-J43.9). Electronically Signed   By: Darliss Cheney M.D.   On: 01/29/2023 17:21   DG Chest Port 1 View  Result Date: 01/27/2023 CLINICAL DATA:  Shortness of breath EXAM: PORTABLE CHEST 1 VIEW  COMPARISON:  07/14/2020 FINDINGS: Borderline cardiac enlargement. Mild central congestion. Probable small pleural effusions. Interstitial and patchy pulmonary opacity. Aortic atherosclerosis. No pneumothorax IMPRESSION: Borderline cardiac enlargement with mild central congestion and probable small pleural effusions. Interstitial and patchy pulmonary opacity, possible superimposed infection/pneumonia Electronically Signed   By: Jasmine Pang M.D.   On: 01/27/2023 23:02    Microbiology: Recent Results (from the past 240 hour(s))  Blood Culture (routine x 2)     Status: None   Collection Time: 01/27/23  7:22 PM   Specimen: BLOOD  Result Value Ref Range Status   Specimen Description BLOOD RIGHT ASSIST CONTROL  Final   Special Requests   Final    BOTTLES DRAWN AEROBIC AND ANAEROBIC Blood Culture results may not be optimal due to an excessive volume of blood received in culture bottles   Culture   Final    NO GROWTH 5 DAYS Performed at [facility], [address], [place], [place] [address]    Report Status 02/01/2023 FINAL  Final  Blood Culture (routine x 2)     Status: None   Collection Time: 01/27/23 10:58 PM   Specimen: BLOOD  Result Value Ref Range Status   Specimen Description BLOOD BLOOD RIGHT ARM  Final   Special Requests   Final    BOTTLES DRAWN AEROBIC AND ANAEROBIC Blood Culture adequate volume   Culture   Final    NO GROWTH 5 DAYS Performed at [facility], [address] [place], [place] [address]    Report Status 02/01/2023 FINAL  Final  Resp panel by RT-PCR (RSV, Flu A&B, Covid) Anterior Nasal Swab     Status: None   Collection Time: 01/28/23 12:25 AM   Specimen: Anterior Nasal Swab  Result Value Ref Range Status   SARS Coronavirus 2 by RT PCR NEGATIVE NEGATIVE Final    Comment: (NOTE) SARS-CoV-2 target nucleic acids are NOT DETECTED.  The SARS-CoV-2 RNA is generally detectable in upper respiratory specimens during the acute phase of infection. The  lowest concentration of SARS-CoV-2 viral copies this assay can detect is 138 copies/mL. A negative result does not preclude SARS-Cov-2 infection and should not be used as the sole basis for treatment or other patient management decisions. A negative result may occur with  improper specimen collection/handling, submission of specimen other than nasopharyngeal swab, presence of viral mutation(s) within the areas targeted by this assay, and inadequate number of viral copies(<138 copies/mL). A negative result must be combined with clinical observations, patient history, and epidemiological information. The expected result is Negative.  Fact Sheet for Patients:  BloggerCourse.com  Fact Sheet for Healthcare Providers:  SeriousBroker.it  This test is no t yet approved or  cleared by the [place] and  has been authorized for detection and/or diagnosis of SARS-CoV-2 by FDA under an Emergency Use Authorization (EUA). This EUA will remain  in effect (meaning this test can be used) for the duration of the COVID-19 declaration under Section 564(b)(1) of the Act, 21 U.S.C.section 360bbb-3(b)(1), unless the authorization is terminated  or revoked sooner.       Influenza A by PCR NEGATIVE NEGATIVE Final   Influenza B by PCR NEGATIVE NEGATIVE Final    Comment: (NOTE) The Xpert Xpress SARS-CoV-2/FLU/RSV plus assay is intended as an aid in the diagnosis of influenza from Nasopharyngeal swab specimens and should not be used as a sole basis for treatment. Nasal washings and aspirates are unacceptable for Xpert Xpress SARS-CoV-2/FLU/RSV testing.  Fact Sheet for Patients: BloggerCourse.com  Fact Sheet for Healthcare Providers: SeriousBroker.it  This test is not yet approved or cleared by the [place] FDA and has been authorized for detection and/or diagnosis of SARS-CoV-2 by FDA under  an Emergency Use Authorization (EUA). This EUA will remain in effect (meaning this test can be used) for the duration of the COVID-19 declaration under Section 564(b)(1) of the Act, 21 U.S.C. section 360bbb-3(b)(1), unless the authorization is terminated or revoked.     Resp Syncytial Virus by PCR NEGATIVE NEGATIVE Final    Comment: (NOTE) Fact Sheet for Patients: BloggerCourse.com  Fact Sheet for Healthcare Providers: SeriousBroker.it  This test is not yet approved or cleared by the [place] FDA and has been authorized for detection and/or diagnosis of SARS-CoV-2 by FDA under an Emergency Use Authorization (EUA). This EUA will remain in effect (meaning this test can be used) for the duration of the COVID-19 declaration under Section 564(b)(1) of the Act, 21 U.S.C. section 360bbb-3(b)(1), unless the authorization is terminated or revoked.  Performed at [facility], [address] [place], [place] [address]   Respiratory (~20 pathogens) panel by PCR     Status: None   Collection Time: 01/29/23  2:44 PM   Specimen: Nasopharyngeal Swab; Respiratory  Result Value Ref Range Status   Adenovirus NOT DETECTED NOT DETECTED Final   Coronavirus 229E NOT DETECTED NOT DETECTED Final    Comment: (NOTE) The Coronavirus on the Respiratory Panel, DOES NOT test for the novel  Coronavirus (2019 nCoV)    Coronavirus HKU1 NOT DETECTED NOT DETECTED Final   Coronavirus NL63 NOT DETECTED NOT DETECTED Final   Coronavirus OC43 NOT DETECTED NOT DETECTED Final   Metapneumovirus NOT DETECTED NOT DETECTED Final   Rhinovirus / Enterovirus NOT DETECTED NOT DETECTED Final   Influenza A NOT DETECTED NOT DETECTED Final   Influenza B NOT DETECTED NOT DETECTED Final   Parainfluenza Virus 1 NOT DETECTED NOT DETECTED Final   Parainfluenza Virus 2 NOT DETECTED NOT DETECTED Final   Parainfluenza Virus 3 NOT DETECTED NOT DETECTED Final    Parainfluenza Virus 4 NOT DETECTED NOT DETECTED Final   Respiratory Syncytial Virus NOT DETECTED NOT DETECTED Final   Bordetella pertussis NOT DETECTED NOT DETECTED Final   Bordetella Parapertussis NOT DETECTED NOT DETECTED Final   Chlamydophila pneumoniae NOT DETECTED NOT DETECTED Final   Mycoplasma pneumoniae NOT DETECTED NOT DETECTED Final    Comment: Performed at [facility], [address], [place], [place] [address]     Labs: CBC: Recent Labs  Lab 01/30/23 0431  WBC 5.2  HGB 10.2*  HCT 30.9*  MCV 101.6*  PLT 255   Basic Metabolic Panel: Recent Labs  Lab  01/30/23 0431 01/31/23 0441 02/01/23 0521 02/02/23 0430  NA 137 139 140 142  K 3.0* 4.3 4.1 4.3  CL 102 103 105 108  CO2 27 28 27 27   GLUCOSE 89 120* 102* 102*  BUN 14 13 18 16   CREATININE 0.67 0.61 0.77 0.74  CALCIUM 8.1* 8.4* 8.5* 8.7*  MG 2.0 2.3 2.1 2.0   Liver Function Tests: No results for input(s): "AST", "ALT", "ALKPHOS", "BILITOT", "PROT", "ALBUMIN" in the last 168 hours. No results for input(s): "LIPASE", "AMYLASE" in the last 168 hours. No results for input(s): "AMMONIA" in the last 168 hours. Cardiac Enzymes: No results for input(s): "CKTOTAL", "CKMB", "CKMBINDEX", "TROPONINI" in the last 168 hours. BNP (last 3 results) Recent Labs    01/27/23 1915  BNP 511.6*   CBG: No results for input(s): "GLUCAP" in the last 168 hours.  Time spent: 35 minutes  Signed:  Gillis Santa  [facility] 02/05/2023 2:44 PM

## 2023-02-05 NOTE — Progress Notes (Signed)
Physical Therapy Treatment Patient Details Name: [name] MRN: [id number] DOB: [birth year] Today's Date: 02/05/2023   History of Present Illness 77 y/o female presented to ED on 01/27/23 for SOB. PMH: depression, anxiety, heart failure, chronic respiratory failure on 2L, RA, memory difficulty, hx of CVA    PT Comments  Co-treat with OT. Pt received in bed on 2L of O2 [place] and agreed to PT session. Pt reports continued discomfort in bilat toes. Pt performed bed mobility CGA and MinA-ModA for fwd scoots towards EOB, and STS with the use of RW (2wheels) ModA to amb MinA with heavy VC for sequencing to [facility]. From [facility] pt amb in room with the use of RW MinA and chair follow. VC necessary during amb for LE sequencing to ensure pt did not walk with feet too close together. Pt maintained 2L of O2 [place] throughout session with vitals reaching 94% Spo2 the lowest and 95% Spo2 the highest. Pt tolerated Tx well today and will continue to benefit from skilled PT sessions to improve endurance, activity tolerance, strength, and functional mobility.    If plan is discharge home, recommend the following: A lot of help with walking and/or transfers;A lot of help with bathing/dressing/bathroom;Assistance with cooking/housework;Help with stairs or ramp for entrance;Assist for transportation   Can travel by private vehicle     No  Equipment Recommendations  Rolling walker (2 wheels);BSC/3in1;Wheelchair (measurements PT);Wheelchair cushion (measurements PT)    Recommendations for Other Services       Precautions / Restrictions Precautions Precautions: Fall Restrictions Weight Bearing Restrictions: No     Mobility  Bed Mobility Overal bed mobility: Needs Assistance Bed Mobility: Supine to Sit     Supine to sit: Contact guard, Used rails     General bed mobility comments: Pt performed bed mobility CGA. While seated EOB, pt required MinA-ModA for fwd scoots. Increased time and effort required, cues for  technique.    Transfers Overall transfer level: Needs assistance Equipment used: Rolling walker (2 wheels) Transfers: Sit to/from Stand Sit to Stand: Mod assist           General transfer comment: Pt performed 2xSTS with the use of RW (2wheels) ModA from EOB and from [facility].    Ambulation/Gait Ambulation/Gait assistance: Min assist Gait Distance (Feet): 15 Feet Assistive device: Rolling walker (2 wheels) Gait Pattern/deviations: Step-to pattern, Antalgic Gait velocity: Decreased     General Gait Details: Pt amb with the use of RW (2wheels) MinA with a chair follow. VC necessary to assist with LE sequencing while amb to ensure pt is not amb with feet too close together.   Stairs             Wheelchair Mobility     Tilt Bed    Modified Rankin (Stroke Patients Only)       Balance Overall balance assessment: Needs assistance Sitting-balance support: Feet supported, Bilateral upper extremity supported Sitting balance-Leahy Scale: Fair     Standing balance support: Bilateral upper extremity supported, Reliant on assistive device for balance Standing balance-Leahy Scale: Fair Standing balance comment: VC necessary to ensure upright posture                            Cognition Arousal: Alert Behavior During Therapy: WFL for tasks assessed/performed Overall Cognitive Status: Within Functional Limits for tasks assessed  General Comments: Pt pleasant and willing to participate in PT session        Exercises      General Comments        Pertinent Vitals/Pain Pain Assessment Pain Assessment: Faces Faces Pain Scale: Hurts a little bit Pain Location: feet with standing Pain Descriptors / Indicators: Discomfort Pain Intervention(s): Monitored during session    Home Living                          Prior Function            PT Goals (current goals can now be found in the care plan  section) Acute Rehab PT Goals Patient Stated Goal: to go home PT Goal Formulation: With patient/family Time For Goal Achievement: 02/14/23 Potential to Achieve Goals: Fair Progress towards PT goals: Progressing toward goals    Frequency    Min 1X/week      PT Plan      Co-evaluation PT/OT/SLP Co-Evaluation/Treatment: Yes Reason for Co-Treatment: To address functional/ADL transfers;For patient/therapist safety PT goals addressed during session: Mobility/safety with mobility OT goals addressed during session: ADL's and self-care      AM-PAC PT "6 Clicks" Mobility   Outcome Measure  Help needed turning from your back to your side while in a flat bed without using bedrails?: A Little Help needed moving from lying on your back to sitting on the side of a flat bed without using bedrails?: A Little Help needed moving to and from a bed to a chair (including a wheelchair)?: A Lot Help needed standing up from a chair using your arms (e.g., wheelchair or bedside chair)?: A Lot Help needed to walk in hospital room?: A Lot Help needed climbing 3-5 steps with a railing? : A Lot 6 Click Score: 14    End of Session Equipment Utilized During Treatment: Gait belt;Oxygen Activity Tolerance: Patient tolerated treatment well Patient left: in chair;with call bell/phone within reach;with chair alarm set Nurse Communication: Mobility status PT Visit Diagnosis: Unsteadiness on feet (R26.81);Muscle weakness (generalized) (M62.81);Difficulty in walking, not elsewhere classified (R26.2)     Time: 1610-9604 PT Time Calculation (min) (ACUTE ONLY): 33 min  Charges:    $Gait Training: 8-22 mins PT General Charges $$ ACUTE PT VISIT: 1 Visit                     Jacek Colson Sauvignon Howard SPT, LAT, ATC   Afua Hoots Sauvignon-Howard 02/05/2023, 2:25 PM

## 2023-02-05 NOTE — Progress Notes (Signed)
Report called to nurse Davetta at [facility].

## 2023-02-05 NOTE — TOC Transition Note (Addendum)
Transition of Care [facility]) - CM/SW Discharge Note   Patient Details  Name: [name] MRN: [id number] Date of Birth: Aug 08, 1945  Transition of Care [facility]) CM/SW Contact:  Truddie Hidden, RN Phone Number: 02/05/2023, 1:55 PM   Clinical Narrative:      Sherron Monday with Gavin Pound in admissions at [facility]  Per facility patient admission confirmed for today. Patient assigned room # 210-P Nurse will call report to [phone] Face sheet and medical necessity forms printed to the floor to be added to the EMS pack EMS arranged "She's number two. "  Discharge summary and SNF transfer report sent in HUB.  Nurse, and patient update. Message left for her spouse/  TOC signing off.    Final next level of care: Skilled Nursing Facility Barriers to Discharge: Barriers Resolved   Patient Goals and CMS Choice CMS Medicare.gov Compare Post Acute Care list provided to:: Patient Choice offered to / list presented to : Patient  Discharge Placement                Patient [facility] bed at: [facility] Patient to be transferred to facility by: [facility] Name of family member notified: spouse Patient and family notified of of transfer: 02/05/23  Discharge Plan and Services Additional resources added to the After Visit Summary for                                       Social Determinants of Health (SDOH) Interventions SDOH Screenings   Food Insecurity: No Food Insecurity (01/28/2023)  Housing: Low Risk  (01/28/2023)  Transportation Needs: No Transportation Needs (01/28/2023)  Utilities: Not At Risk (01/28/2023)  Financial Resource Strain: Low Risk  (07/01/2022)   Received from [facility], [facility]  Physical Activity: Insufficiently Active (05/23/2022)   Received from [facility], [facility]  Social Connections: Socially Integrated (05/23/2022)   Received from [facility], [facility]  Stress: Stress Concern Present (05/23/2022)   Received from [facility], [facility]  Tobacco Use: Low Risk  (01/28/2023)  Recent Concern: Tobacco Use - Medium Risk (01/10/2023)   Received from [facility]     Readmission Risk Interventions     No data to display

## 2023-02-05 NOTE — Progress Notes (Signed)
Occupational Therapy Treatment Patient Details Name: [name] MRN: [id number] DOB: 1945-07-31 Today's Date: 02/05/2023   History of present illness 77 y/o female presented to ED on 01/27/23 for SOB. PMH: depression, anxiety, heart failure, chronic respiratory failure on 2L, RA, memory difficulty, hx of CVA   OT comments  Pt is supine in bed on arrival. Easily arousable and agreeable to OT session. She denies pain at rest, but does report pain in feet/toes with standing. Pt performed bed mobility with CGA and use of bed rail. Pt required Min/Mod A to scoot to EOB and Min A x2 for STS from EOB to RW. SPT to [facility] with increased time and Min A x2 for sequencing. Total/max A for posterior hygiene in standing at RW and Mod A for LB dressing to don mesh underwear and STS from [facility]. She needed Min a X1 for short distance mobility with recliner follow in room. 02 94% and higher throughout session.  Pt left in recliner with all needs in place and will cont to require skilled acute OT services to maximize his safety and IND to return to PLOF.     d  If plan is discharge home, recommend the following:  A lot of help with walking and/or transfers;A lot of help with bathing/dressing/bathroom;Assistance with cooking/housework;Help with stairs or ramp for entrance;Assist for transportation   Equipment Recommendations  None recommended by OT    Recommendations for Other Services      Precautions / Restrictions Precautions Precautions: Fall Restrictions Weight Bearing Restrictions: No       Mobility Bed Mobility Overal bed mobility: Needs Assistance Bed Mobility: Supine to Sit     Supine to sit: Contact guard, Used rails     General bed mobility comments: CGA using bed rails to get to EOB then needed Min/Mod A for forward scooting to EOB    Transfers Overall transfer level: Needs assistance Equipment used: Rolling walker (2 wheels) Transfers: Sit to/from Stand Sit to Stand: Min assist,  +2 physical assistance     Step pivot transfers: Min assist, +2 safety/equipment     General transfer comment: Min A x2 for STS from EOB and SPT to BSC, Mod A for STS from [facility] and Min a X1 for ambulation with recliner follow     Balance Overall balance assessment: Needs assistance Sitting-balance support: Feet supported, Bilateral upper extremity supported Sitting balance-Leahy Scale: Fair     Standing balance support: Bilateral upper extremity supported, Reliant on assistive device for balance Standing balance-Leahy Scale: Fair Standing balance comment: able to stand at [facility] with assist from external support to be cleaned and pull pants up in standing                           ADL either performed or assessed with clinical judgement   ADL Overall ADL's : Needs assistance/impaired                     Lower Body Dressing: Sit to/from stand;Moderate assistance   Toilet Transfer: Rolling walker (2 wheels);Minimal assistance;+2 for physical assistance Toilet Transfer Details (indicate cue type and reason): Mod A with increased time Toileting- Clothing Manipulation and Hygiene: Maximal assistance;Total assistance;Sit to/from stand       Functional mobility during ADLs: Minimal assistance;Rolling walker (2 wheels)      Extremity/Trunk Assessment Upper Extremity Assessment Upper Extremity Assessment: Generalized weakness   Lower Extremity Assessment Lower Extremity Assessment: Generalized weakness  Cervical / Trunk Assessment Cervical / Trunk Assessment: Kyphotic    Vision       Perception     Praxis      Cognition Arousal: Alert Behavior During Therapy: WFL for tasks assessed/performed Overall Cognitive Status: Within Functional Limits for tasks assessed                                 General Comments: Pt pleasant and willing to participate in OT session        Exercises      Shoulder Instructions       General Comments       Pertinent Vitals/ Pain       Pain Assessment Pain Assessment: Faces Faces Pain Scale: Hurts a little bit Pain Location: feet with standing Pain Descriptors / Indicators: Discomfort Pain Intervention(s): Monitored during session  Home Living                                          Prior Functioning/Environment              Frequency  Min 1X/week        Progress Toward Goals  OT Goals(current goals can now be found in the care plan section)  Progress towards OT goals: Progressing toward goals  Acute Rehab OT Goals Patient Stated Goal: improve strength OT Goal Formulation: With patient Time For Goal Achievement: 02/14/23 Potential to Achieve Goals: Good  Plan      Co-evaluation    PT/OT/SLP Co-Evaluation/Treatment: Yes Reason for Co-Treatment: To address functional/ADL transfers;For patient/therapist safety PT goals addressed during session: Mobility/safety with mobility OT goals addressed during session: ADL's and self-care      AM-PAC OT "6 Clicks" Daily Activity     Outcome Measure   Help from another person eating meals?: A Little Help from another person taking care of personal grooming?: A Little Help from another person toileting, which includes using toliet, bedpan, or urinal?: A Lot Help from another person bathing (including washing, rinsing, drying)?: A Lot Help from another person to put on and taking off regular upper body clothing?: A Lot Help from another person to put on and taking off regular lower body clothing?: A Lot 6 Click Score: 14    End of Session Equipment Utilized During Treatment: Oxygen  OT Visit Diagnosis: Other abnormalities of gait and mobility (R26.89);Unsteadiness on feet (R26.81);History of falling (Z91.81);Muscle weakness (generalized) (M62.81)   Activity Tolerance Patient tolerated treatment well   Patient Left with call bell/phone within reach;in chair;with chair alarm set;with nursing/sitter  in room   Nurse Communication Mobility status        Time: 1110-1144 OT Time Calculation (min): 34 min  Charges: OT General Charges $OT Visit: 1 Visit OT Treatments $Self Care/Home Management : 8-22 mins  Sonali Wivell, OTR/L  02/05/23, 1:21 PM   Fardowsa Authier E Aretta Stetzel 02/05/2023, 1:18 PM

## 2023-02-05 NOTE — TOC Progression Note (Signed)
Transition of Care [facility]) - Progression Note    Patient Details  Name: [name] MRN: [id number] Date of Birth: 1946-03-10  Transition of Care [facility]) CM/SW Contact  Truddie Hidden, RN Phone Number: 02/05/2023, 10:14 AM  Clinical Narrative:    Spoke with Tammy at [facility]. Patient is approved for SNF # [id number]. EMS approval [phone].      Expected Discharge Plan: Skilled Nursing Facility Barriers to Discharge: Continued Medical Work up  Expected Discharge Plan and Services       Living arrangements for the past 2 months: Single Family Home                                       Social Determinants of Health (SDOH) Interventions SDOH Screenings   Food Insecurity: No Food Insecurity (01/28/2023)  Housing: Low Risk  (01/28/2023)  Transportation Needs: No Transportation Needs (01/28/2023)  Utilities: Not At Risk (01/28/2023)  Financial Resource Strain: Low Risk  (07/01/2022)   Received from [facility], [facility]  Physical Activity: Insufficiently Active (05/23/2022)   Received from [facility], [facility]  Social Connections: Socially Integrated (05/23/2022)   Received from [facility], [facility]  Stress: Stress Concern Present (05/23/2022)   Received from [facility], [facility]  Tobacco Use: Low Risk  (01/28/2023)  Recent Concern: Tobacco Use - Medium Risk (01/10/2023)   Received from [facility]    Readmission Risk Interventions     No data to display

## 2023-02-05 NOTE — Progress Notes (Signed)
Discharge instructions placed in discharge packet and sent with EMS. Patient transferred to stretcher and left room with [facility] going to [facility]. Husband in room at time.

## 2023-02-06 ENCOUNTER — Encounter: Payer: PPO | Admitting: Family

## 2023-02-09 ENCOUNTER — Other Ambulatory Visit: Payer: Self-pay

## 2023-02-09 ENCOUNTER — Emergency Department
Admission: EM | Admit: 2023-02-09 | Discharge: 2023-02-10 | Disposition: A | Discharge status: Home / Self Care | Payer: PPO | Attending: Emergency Medicine | Admitting: Emergency Medicine

## 2023-02-09 ENCOUNTER — Encounter: Payer: Self-pay | Admitting: Emergency Medicine

## 2023-02-09 DIAGNOSIS — R040 Epistaxis: Secondary | ICD-10-CM | POA: Diagnosis not present

## 2023-02-09 DIAGNOSIS — I509 Heart failure, unspecified: Secondary | ICD-10-CM | POA: Insufficient documentation

## 2023-02-09 DIAGNOSIS — J449 Chronic obstructive pulmonary disease, unspecified: Secondary | ICD-10-CM | POA: Insufficient documentation

## 2023-02-09 DIAGNOSIS — R58 Hemorrhage, not elsewhere classified: Secondary | ICD-10-CM | POA: Diagnosis not present

## 2023-02-09 MED ORDER — TRANEXAMIC ACID FOR EPISTAXIS
500.0000 mg | Freq: Once | TOPICAL | Status: AC
  Administered 2023-02-10: 500 mg via TOPICAL
  Filled 2023-02-09: qty 10
  Filled 2023-02-09: qty 5

## 2023-02-09 NOTE — ED Provider Notes (Signed)
[facility] Provider Note    Event Date/Time   First MD Initiated Contact with Patient 02/09/23 2257     (approximate)   History   Epistaxis (PT FROM SNF C/O STARTING WITH A NOSEBLEED APPROXIMATELY NOON; PT ON CLOPIDOGREL)   HPI  [name] is a 77 y.o. female who presents to the ED for evaluation of Epistaxis (PT FROM SNF C/O STARTING WITH A NOSEBLEED APPROXIMATELY NOON; PT ON CLOPIDOGREL)   I review medical dc summary from 4 days ago. COPD/CHF exacerbation with CAP. Chronic resp failure on 2LNC. RA. DAPT.   Patient resides at [facility].  She presents to the ED by EMS from her facility because of a nosebleed that has since resolved.  She is hemostatic on arrival to the ED from a left-sided epistaxis.  No posterior bleeding.  No trauma, reports dry mucous membranes associated with her nasal cannula   Physical Exam   Triage Vital Signs: ED Triage Vitals [02/09/23 2259]  Encounter Vitals Group     BP      Systolic BP Percentile      Diastolic BP Percentile      Pulse      Resp      Temp      Temp src      SpO2      Weight      Height      Head Circumference      Peak Flow      Pain Score 0     Pain Loc      Pain Education      Exclude from Growth Chart     Most recent vital signs: Vitals:   02/09/23 2330 02/10/23 0000  BP: 134/72 124/86  Pulse: 78 78  Resp:  16  Temp:  98.6 F (37 C)  SpO2:  96%    General: Awake, no distress.  CV:  Good peripheral perfusion.  Resp:  Normal effort.  Abd:  No distention.  MSK:  No deformity noted.  Neuro:  No focal deficits appreciated. Other:  Some dried blood around the left nare.  No signs of active bleeding.  No signs of posterior bleeding.  No signs of nasal septal hematoma.   ED Results / Procedures / Treatments   Labs (all labs ordered are listed, but only abnormal results are displayed) Labs Reviewed - No data to display  EKG   RADIOLOGY   Official radiology  report(s): No results found.  PROCEDURES and INTERVENTIONS:  Procedures  I do apply topical TXA with an atomizer to her left nare to help reduce rebleeding  Medications  tranexamic acid (CYKLOKAPRON) 1000 MG/10ML topical solution 500 mg (has no administration in time range)     IMPRESSION / MDM / ASSESSMENT AND PLAN / ED COURSE  I reviewed the triage vital signs and the nursing notes.  Differential diagnosis includes, but is not limited to, anterior bleed, posterior bleed, trauma  {Patient presents with symptoms of an acute illness or injury that is potentially life-threatening.  Patient on DAPT presents with a nosebleed that has since resolved.  She is observed for couple hours without rebleeding.  I do apply TXA with an atomizer to help reduce rebleeding.  We discussed keeping her nose moist in the setting of her chronic nasal cannula.  Suitable for outpatient management      FINAL CLINICAL IMPRESSION(S) / ED DIAGNOSES   Final diagnoses:  Left-sided epistaxis     Rx /  DC Orders   ED Discharge Orders     None        Note:  This document was prepared using Dragon voice recognition software and may include unintentional dictation errors.   Delton Pra[place], MD 02/10/23 [phone]

## 2023-02-10 DIAGNOSIS — I639 Cerebral infarction, unspecified: Secondary | ICD-10-CM | POA: Diagnosis not present

## 2023-02-10 DIAGNOSIS — Z758 Other problems related to medical facilities and other health care: Secondary | ICD-10-CM | POA: Diagnosis not present

## 2023-02-10 DIAGNOSIS — R040 Epistaxis: Secondary | ICD-10-CM | POA: Diagnosis not present

## 2023-02-10 DIAGNOSIS — I509 Heart failure, unspecified: Secondary | ICD-10-CM | POA: Diagnosis not present

## 2023-02-10 DIAGNOSIS — Z8673 Personal history of transient ischemic attack (TIA), and cerebral infarction without residual deficits: Secondary | ICD-10-CM | POA: Diagnosis not present

## 2023-02-10 DIAGNOSIS — J9621 Acute and chronic respiratory failure with hypoxia: Secondary | ICD-10-CM | POA: Diagnosis not present

## 2023-02-10 DIAGNOSIS — J8410 Pulmonary fibrosis, unspecified: Secondary | ICD-10-CM | POA: Diagnosis not present

## 2023-02-10 DIAGNOSIS — E039 Hypothyroidism, unspecified: Secondary | ICD-10-CM | POA: Diagnosis not present

## 2023-02-10 DIAGNOSIS — F3341 Major depressive disorder, recurrent, in partial remission: Secondary | ICD-10-CM | POA: Diagnosis not present

## 2023-02-10 DIAGNOSIS — Z743 Need for continuous supervision: Secondary | ICD-10-CM | POA: Diagnosis not present

## 2023-02-10 DIAGNOSIS — J9601 Acute respiratory failure with hypoxia: Secondary | ICD-10-CM | POA: Diagnosis not present

## 2023-02-10 DIAGNOSIS — E785 Hyperlipidemia, unspecified: Secondary | ICD-10-CM | POA: Diagnosis not present

## 2023-02-10 DIAGNOSIS — M059 Rheumatoid arthritis with rheumatoid factor, unspecified: Secondary | ICD-10-CM | POA: Diagnosis not present

## 2023-02-10 DIAGNOSIS — R0902 Hypoxemia: Secondary | ICD-10-CM | POA: Diagnosis not present

## 2023-02-10 DIAGNOSIS — J9610 Chronic respiratory failure, unspecified whether with hypoxia or hypercapnia: Secondary | ICD-10-CM | POA: Diagnosis not present

## 2023-02-10 DIAGNOSIS — J189 Pneumonia, unspecified organism: Secondary | ICD-10-CM | POA: Diagnosis not present

## 2023-02-10 DIAGNOSIS — Z9981 Dependence on supplemental oxygen: Secondary | ICD-10-CM | POA: Diagnosis not present

## 2023-02-10 DIAGNOSIS — G4730 Sleep apnea, unspecified: Secondary | ICD-10-CM | POA: Diagnosis not present

## 2023-02-10 DIAGNOSIS — M6281 Muscle weakness (generalized): Secondary | ICD-10-CM | POA: Diagnosis not present

## 2023-02-10 DIAGNOSIS — I10 Essential (primary) hypertension: Secondary | ICD-10-CM | POA: Diagnosis not present

## 2023-02-10 DIAGNOSIS — E559 Vitamin D deficiency, unspecified: Secondary | ICD-10-CM | POA: Diagnosis not present

## 2023-02-10 NOTE — ED Notes (Signed)
MD to bedside to administer Cyklokapron - epistaxis remains resolved.

## 2023-02-10 NOTE — Discharge Instructions (Signed)
Please help keep her nose moist to prevent bleeding from being so dry from the nasal cannula

## 2023-02-20 DIAGNOSIS — Z9981 Dependence on supplemental oxygen: Secondary | ICD-10-CM | POA: Diagnosis not present

## 2023-02-20 DIAGNOSIS — J8410 Pulmonary fibrosis, unspecified: Secondary | ICD-10-CM | POA: Diagnosis not present

## 2023-02-20 DIAGNOSIS — R0902 Hypoxemia: Secondary | ICD-10-CM | POA: Diagnosis not present

## 2023-03-05 DIAGNOSIS — E559 Vitamin D deficiency, unspecified: Secondary | ICD-10-CM | POA: Diagnosis not present

## 2023-03-05 DIAGNOSIS — I10 Essential (primary) hypertension: Secondary | ICD-10-CM | POA: Diagnosis not present

## 2023-03-17 DIAGNOSIS — Z7902 Long term (current) use of antithrombotics/antiplatelets: Secondary | ICD-10-CM | POA: Diagnosis not present

## 2023-03-17 DIAGNOSIS — M069 Rheumatoid arthritis, unspecified: Secondary | ICD-10-CM | POA: Diagnosis not present

## 2023-03-17 DIAGNOSIS — Z792 Long term (current) use of antibiotics: Secondary | ICD-10-CM | POA: Diagnosis not present

## 2023-03-17 DIAGNOSIS — F419 Anxiety disorder, unspecified: Secondary | ICD-10-CM | POA: Diagnosis not present

## 2023-03-17 DIAGNOSIS — M1990 Unspecified osteoarthritis, unspecified site: Secondary | ICD-10-CM | POA: Diagnosis not present

## 2023-03-17 DIAGNOSIS — J9610 Chronic respiratory failure, unspecified whether with hypoxia or hypercapnia: Secondary | ICD-10-CM | POA: Diagnosis not present

## 2023-03-17 DIAGNOSIS — G4733 Obstructive sleep apnea (adult) (pediatric): Secondary | ICD-10-CM | POA: Diagnosis not present

## 2023-03-17 DIAGNOSIS — I2510 Atherosclerotic heart disease of native coronary artery without angina pectoris: Secondary | ICD-10-CM | POA: Diagnosis not present

## 2023-03-17 DIAGNOSIS — I5032 Chronic diastolic (congestive) heart failure: Secondary | ICD-10-CM | POA: Diagnosis not present

## 2023-03-17 DIAGNOSIS — Z556 Problems related to health literacy: Secondary | ICD-10-CM | POA: Diagnosis not present

## 2023-03-17 DIAGNOSIS — E785 Hyperlipidemia, unspecified: Secondary | ICD-10-CM | POA: Diagnosis not present

## 2023-03-17 DIAGNOSIS — F339 Major depressive disorder, recurrent, unspecified: Secondary | ICD-10-CM | POA: Diagnosis not present

## 2023-03-17 DIAGNOSIS — M25552 Pain in left hip: Secondary | ICD-10-CM | POA: Diagnosis not present

## 2023-03-17 DIAGNOSIS — M25551 Pain in right hip: Secondary | ICD-10-CM | POA: Diagnosis not present

## 2023-03-17 DIAGNOSIS — Z9981 Dependence on supplemental oxygen: Secondary | ICD-10-CM | POA: Diagnosis not present

## 2023-03-17 DIAGNOSIS — M25561 Pain in right knee: Secondary | ICD-10-CM | POA: Diagnosis not present

## 2023-03-17 DIAGNOSIS — M545 Low back pain, unspecified: Secondary | ICD-10-CM | POA: Diagnosis not present

## 2023-03-17 DIAGNOSIS — E039 Hypothyroidism, unspecified: Secondary | ICD-10-CM | POA: Diagnosis not present

## 2023-03-17 DIAGNOSIS — Z8673 Personal history of transient ischemic attack (TIA), and cerebral infarction without residual deficits: Secondary | ICD-10-CM | POA: Diagnosis not present

## 2023-03-17 DIAGNOSIS — M25562 Pain in left knee: Secondary | ICD-10-CM | POA: Diagnosis not present

## 2023-03-17 DIAGNOSIS — Z86711 Personal history of pulmonary embolism: Secondary | ICD-10-CM | POA: Diagnosis not present

## 2023-03-17 DIAGNOSIS — J8410 Pulmonary fibrosis, unspecified: Secondary | ICD-10-CM | POA: Diagnosis not present

## 2023-03-17 DIAGNOSIS — Z79899 Other long term (current) drug therapy: Secondary | ICD-10-CM | POA: Diagnosis not present

## 2023-03-17 DIAGNOSIS — J479 Bronchiectasis, uncomplicated: Secondary | ICD-10-CM | POA: Diagnosis not present

## 2023-03-22 DIAGNOSIS — J8410 Pulmonary fibrosis, unspecified: Secondary | ICD-10-CM | POA: Diagnosis not present

## 2023-03-22 DIAGNOSIS — R0902 Hypoxemia: Secondary | ICD-10-CM | POA: Diagnosis not present

## 2023-03-22 DIAGNOSIS — Z9981 Dependence on supplemental oxygen: Secondary | ICD-10-CM | POA: Diagnosis not present

## 2023-04-08 DIAGNOSIS — J8410 Pulmonary fibrosis, unspecified: Secondary | ICD-10-CM | POA: Diagnosis not present

## 2023-04-08 DIAGNOSIS — I2510 Atherosclerotic heart disease of native coronary artery without angina pectoris: Secondary | ICD-10-CM | POA: Diagnosis not present

## 2023-04-08 DIAGNOSIS — F419 Anxiety disorder, unspecified: Secondary | ICD-10-CM | POA: Diagnosis not present

## 2023-04-08 DIAGNOSIS — I5032 Chronic diastolic (congestive) heart failure: Secondary | ICD-10-CM | POA: Diagnosis not present

## 2023-04-08 DIAGNOSIS — M069 Rheumatoid arthritis, unspecified: Secondary | ICD-10-CM | POA: Diagnosis not present

## 2023-04-08 DIAGNOSIS — J479 Bronchiectasis, uncomplicated: Secondary | ICD-10-CM | POA: Diagnosis not present

## 2023-04-08 DIAGNOSIS — J9610 Chronic respiratory failure, unspecified whether with hypoxia or hypercapnia: Secondary | ICD-10-CM | POA: Diagnosis not present

## 2023-04-15 DIAGNOSIS — I779 Disorder of arteries and arterioles, unspecified: Secondary | ICD-10-CM | POA: Diagnosis not present

## 2023-04-15 DIAGNOSIS — R413 Other amnesia: Secondary | ICD-10-CM | POA: Diagnosis not present

## 2023-04-15 DIAGNOSIS — E441 Mild protein-calorie malnutrition: Secondary | ICD-10-CM | POA: Diagnosis not present

## 2023-04-15 DIAGNOSIS — I5032 Chronic diastolic (congestive) heart failure: Secondary | ICD-10-CM | POA: Diagnosis not present

## 2023-04-15 DIAGNOSIS — R300 Dysuria: Secondary | ICD-10-CM | POA: Diagnosis not present

## 2023-04-15 DIAGNOSIS — J9611 Chronic respiratory failure with hypoxia: Secondary | ICD-10-CM | POA: Diagnosis not present

## 2023-04-15 DIAGNOSIS — Z8673 Personal history of transient ischemic attack (TIA), and cerebral infarction without residual deficits: Secondary | ICD-10-CM | POA: Diagnosis not present

## 2023-04-15 DIAGNOSIS — D692 Other nonthrombocytopenic purpura: Secondary | ICD-10-CM | POA: Diagnosis not present

## 2023-04-15 DIAGNOSIS — E034 Atrophy of thyroid (acquired): Secondary | ICD-10-CM | POA: Diagnosis not present

## 2023-04-15 DIAGNOSIS — M0579 Rheumatoid arthritis with rheumatoid factor of multiple sites without organ or systems involvement: Secondary | ICD-10-CM | POA: Diagnosis not present

## 2023-04-15 DIAGNOSIS — F3341 Major depressive disorder, recurrent, in partial remission: Secondary | ICD-10-CM | POA: Diagnosis not present

## 2023-04-15 DIAGNOSIS — J8410 Pulmonary fibrosis, unspecified: Secondary | ICD-10-CM | POA: Diagnosis not present

## 2023-04-15 DIAGNOSIS — R112 Nausea with vomiting, unspecified: Secondary | ICD-10-CM | POA: Diagnosis not present

## 2023-04-15 DIAGNOSIS — R7303 Prediabetes: Secondary | ICD-10-CM | POA: Diagnosis not present

## 2023-04-15 DIAGNOSIS — E7849 Other hyperlipidemia: Secondary | ICD-10-CM | POA: Diagnosis not present

## 2023-04-15 DIAGNOSIS — D849 Immunodeficiency, unspecified: Secondary | ICD-10-CM | POA: Diagnosis not present

## 2023-04-16 DIAGNOSIS — E7849 Other hyperlipidemia: Secondary | ICD-10-CM | POA: Diagnosis not present

## 2023-04-16 DIAGNOSIS — R7303 Prediabetes: Secondary | ICD-10-CM | POA: Diagnosis not present

## 2023-04-16 DIAGNOSIS — E034 Atrophy of thyroid (acquired): Secondary | ICD-10-CM | POA: Diagnosis not present

## 2023-04-17 ENCOUNTER — Other Ambulatory Visit: Payer: Self-pay | Admitting: Internal Medicine

## 2023-04-17 DIAGNOSIS — I5032 Chronic diastolic (congestive) heart failure: Secondary | ICD-10-CM

## 2023-04-17 DIAGNOSIS — J471 Bronchiectasis with (acute) exacerbation: Secondary | ICD-10-CM

## 2023-04-22 DIAGNOSIS — J8410 Pulmonary fibrosis, unspecified: Secondary | ICD-10-CM | POA: Diagnosis not present

## 2023-04-22 DIAGNOSIS — R0902 Hypoxemia: Secondary | ICD-10-CM | POA: Diagnosis not present

## 2023-04-22 DIAGNOSIS — Z9981 Dependence on supplemental oxygen: Secondary | ICD-10-CM | POA: Diagnosis not present

## 2023-04-24 ENCOUNTER — Ambulatory Visit
Admission: RE | Admit: 2023-04-24 | Discharge: 2023-04-24 | Disposition: A | Discharge status: Home / Self Care | Payer: PPO | Source: Ambulatory Visit | Attending: Internal Medicine | Admitting: Internal Medicine

## 2023-04-24 ENCOUNTER — Other Ambulatory Visit: Payer: Self-pay | Admitting: Internal Medicine

## 2023-04-24 DIAGNOSIS — I5032 Chronic diastolic (congestive) heart failure: Secondary | ICD-10-CM | POA: Diagnosis not present

## 2023-04-24 DIAGNOSIS — J432 Centrilobular emphysema: Secondary | ICD-10-CM | POA: Diagnosis not present

## 2023-04-24 DIAGNOSIS — J90 Pleural effusion, not elsewhere classified: Secondary | ICD-10-CM | POA: Diagnosis not present

## 2023-04-24 DIAGNOSIS — R1312 Dysphagia, oropharyngeal phase: Secondary | ICD-10-CM

## 2023-04-24 DIAGNOSIS — J471 Bronchiectasis with (acute) exacerbation: Secondary | ICD-10-CM | POA: Insufficient documentation

## 2023-04-24 DIAGNOSIS — J479 Bronchiectasis, uncomplicated: Secondary | ICD-10-CM | POA: Diagnosis not present

## 2023-04-24 DIAGNOSIS — R918 Other nonspecific abnormal finding of lung field: Secondary | ICD-10-CM | POA: Diagnosis not present

## 2023-04-30 DIAGNOSIS — R251 Tremor, unspecified: Secondary | ICD-10-CM | POA: Diagnosis not present

## 2023-04-30 DIAGNOSIS — R1310 Dysphagia, unspecified: Secondary | ICD-10-CM | POA: Diagnosis not present

## 2023-04-30 DIAGNOSIS — R21 Rash and other nonspecific skin eruption: Secondary | ICD-10-CM | POA: Diagnosis not present

## 2023-04-30 DIAGNOSIS — H532 Diplopia: Secondary | ICD-10-CM | POA: Diagnosis not present

## 2023-04-30 DIAGNOSIS — R498 Other voice and resonance disorders: Secondary | ICD-10-CM | POA: Diagnosis not present

## 2023-05-07 DIAGNOSIS — J690 Pneumonitis due to inhalation of food and vomit: Secondary | ICD-10-CM | POA: Diagnosis not present

## 2023-05-07 DIAGNOSIS — J449 Chronic obstructive pulmonary disease, unspecified: Secondary | ICD-10-CM | POA: Diagnosis not present

## 2023-05-07 DIAGNOSIS — R490 Dysphonia: Secondary | ICD-10-CM | POA: Diagnosis not present

## 2023-05-07 DIAGNOSIS — R053 Chronic cough: Secondary | ICD-10-CM | POA: Diagnosis not present

## 2023-05-07 DIAGNOSIS — J849 Interstitial pulmonary disease, unspecified: Secondary | ICD-10-CM | POA: Diagnosis not present

## 2023-05-07 DIAGNOSIS — J9611 Chronic respiratory failure with hypoxia: Secondary | ICD-10-CM | POA: Diagnosis not present

## 2023-05-07 DIAGNOSIS — R5381 Other malaise: Secondary | ICD-10-CM | POA: Diagnosis not present

## 2023-05-07 DIAGNOSIS — J479 Bronchiectasis, uncomplicated: Secondary | ICD-10-CM | POA: Diagnosis not present

## 2023-05-10 DIAGNOSIS — E039 Hypothyroidism, unspecified: Secondary | ICD-10-CM | POA: Diagnosis not present

## 2023-05-10 DIAGNOSIS — I110 Hypertensive heart disease with heart failure: Secondary | ICD-10-CM | POA: Diagnosis not present

## 2023-05-10 DIAGNOSIS — K5900 Constipation, unspecified: Secondary | ICD-10-CM | POA: Diagnosis not present

## 2023-05-10 DIAGNOSIS — F32A Depression, unspecified: Secondary | ICD-10-CM | POA: Diagnosis not present

## 2023-05-10 DIAGNOSIS — J479 Bronchiectasis, uncomplicated: Secondary | ICD-10-CM | POA: Diagnosis not present

## 2023-05-10 DIAGNOSIS — H539 Unspecified visual disturbance: Secondary | ICD-10-CM | POA: Diagnosis not present

## 2023-05-10 DIAGNOSIS — M059 Rheumatoid arthritis with rheumatoid factor, unspecified: Secondary | ICD-10-CM | POA: Diagnosis not present

## 2023-05-10 DIAGNOSIS — J8410 Pulmonary fibrosis, unspecified: Secondary | ICD-10-CM | POA: Diagnosis not present

## 2023-05-10 DIAGNOSIS — F419 Anxiety disorder, unspecified: Secondary | ICD-10-CM | POA: Diagnosis not present

## 2023-05-10 DIAGNOSIS — I69351 Hemiplegia and hemiparesis following cerebral infarction affecting right dominant side: Secondary | ICD-10-CM | POA: Diagnosis not present

## 2023-05-10 DIAGNOSIS — G629 Polyneuropathy, unspecified: Secondary | ICD-10-CM | POA: Diagnosis not present

## 2023-05-10 DIAGNOSIS — R498 Other voice and resonance disorders: Secondary | ICD-10-CM | POA: Diagnosis not present

## 2023-05-10 DIAGNOSIS — E46 Unspecified protein-calorie malnutrition: Secondary | ICD-10-CM | POA: Diagnosis not present

## 2023-05-10 DIAGNOSIS — E785 Hyperlipidemia, unspecified: Secondary | ICD-10-CM | POA: Diagnosis not present

## 2023-05-10 DIAGNOSIS — M1990 Unspecified osteoarthritis, unspecified site: Secondary | ICD-10-CM | POA: Diagnosis not present

## 2023-05-10 DIAGNOSIS — M47812 Spondylosis without myelopathy or radiculopathy, cervical region: Secondary | ICD-10-CM | POA: Diagnosis not present

## 2023-05-10 DIAGNOSIS — R7303 Prediabetes: Secondary | ICD-10-CM | POA: Diagnosis not present

## 2023-05-10 DIAGNOSIS — Z7902 Long term (current) use of antithrombotics/antiplatelets: Secondary | ICD-10-CM | POA: Diagnosis not present

## 2023-05-10 DIAGNOSIS — G20B1 Parkinson's disease with dyskinesia, without mention of fluctuations: Secondary | ICD-10-CM | POA: Diagnosis not present

## 2023-05-10 DIAGNOSIS — I69354 Hemiplegia and hemiparesis following cerebral infarction affecting left non-dominant side: Secondary | ICD-10-CM | POA: Diagnosis not present

## 2023-05-10 DIAGNOSIS — G20A1 Parkinson's disease without dyskinesia, without mention of fluctuations: Secondary | ICD-10-CM | POA: Diagnosis not present

## 2023-05-10 DIAGNOSIS — H9193 Unspecified hearing loss, bilateral: Secondary | ICD-10-CM | POA: Diagnosis not present

## 2023-05-10 DIAGNOSIS — J9611 Chronic respiratory failure with hypoxia: Secondary | ICD-10-CM | POA: Diagnosis not present

## 2023-05-10 DIAGNOSIS — I5032 Chronic diastolic (congestive) heart failure: Secondary | ICD-10-CM | POA: Diagnosis not present

## 2023-05-10 DIAGNOSIS — I69322 Dysarthria following cerebral infarction: Secondary | ICD-10-CM | POA: Diagnosis not present

## 2023-05-10 DIAGNOSIS — G4730 Sleep apnea, unspecified: Secondary | ICD-10-CM | POA: Diagnosis not present

## 2023-05-10 DIAGNOSIS — I69393 Ataxia following cerebral infarction: Secondary | ICD-10-CM | POA: Diagnosis not present

## 2023-05-15 ENCOUNTER — Ambulatory Visit
Admission: RE | Admit: 2023-05-15 | Discharge: 2023-05-15 | Disposition: A | Discharge status: Home / Self Care | Payer: PPO | Source: Ambulatory Visit | Attending: Internal Medicine | Admitting: Internal Medicine

## 2023-05-15 DIAGNOSIS — R1312 Dysphagia, oropharyngeal phase: Secondary | ICD-10-CM | POA: Insufficient documentation

## 2023-05-15 DIAGNOSIS — I5032 Chronic diastolic (congestive) heart failure: Secondary | ICD-10-CM | POA: Insufficient documentation

## 2023-05-15 DIAGNOSIS — R638 Other symptoms and signs concerning food and fluid intake: Secondary | ICD-10-CM | POA: Diagnosis not present

## 2023-05-15 NOTE — Therapy (Signed)
   Modified Barium Swallow Study  Patient Details  Name: [name] MRN: [id number] Date of Birth: 11-24-1945  Today's Date: 05/15/2023  Modified Barium Swallow completed.  Full report located under Chart Review in the Imaging Section.  History of Present Illness 78 y/o female who presents today for MBSS. Pt with complaints of intermittent difficulty swallowing. PMH: depression, anxiety, heart failure, respiratory failure, RA, memory difficulty, hx of CVA. Pt with a hx of "mild-moderate, chronic oropharyngeal dysphagia" per [facility] May 2024. At that time, a regular diet with thin liquids was recommended as well as safe swallowing/aspiration precautions. CT Chest, 04/26/23, "1. Stable borderline mild cardiomegaly. Three-vessel coronary   atherosclerosis.   2. Small layering right pleural effusion.   3. Severe centrilobular emphysema with mild diffuse bronchial wall   thickening, suggesting COPD.   4. Chronic mild bronchiectasis in the mid to lower lungs is   unchanged.   5. Parenchymal bands scattered throughout the mid to lower lungs,   similar, compatible with nonspecific postinfectious/postinflammatory   scarring.   6. Small hiatal hernia.   7. Cholelithiasis.   8. Aortic Atherosclerosis (ICD10-I70.0) and Emphysema (ICD10-J43.9)."   Clinical Impression Patient appears to present w/ what appears to be mild-moderate, chronic oropharyngeal dysphagia w/ contributing factors of sensorimotor deficits, cognitive deficits, generalized weakness, age-related changes and structural changes.  Oral phase is characterized by  mildly prolonged mastication andi residue post-swallow. Secondary swallows and liquid wash reduced stasis. Pharyngeally, pt with dwallow initiation is consistently delayed to the level of spilling to the pyriform sinuses w/ liquids -- thin, nectar. Pt with before and during the swallow of these liquid consistencies. During completion of swallow and/or throat clear, the penetrated material  appeared to clear the vestibule. x1 instance of audible aspiration noted which occured on the initial trial. Pharyngeal phase is noted for decreased base of tongue retraction, decreased hyolaryngeal excursion, and mildly decreased stripping wave resulting in residue collection in the valleculae, pyriform sinuses, aryepiglottic folds, and along the posterior pharyngeal wall -- moreso with solids. Dry swallow and liquid wash effective in reducing and/or clearing pharyngeal residue. ?prominent cricopharyngeus vs reduced duration/amplitudue of UES    opening. Findings from today's MBSS are c/w prior findings. Recommend a regular diet with thin liquids with safe swallowing strategies including standard aspiration/reflux precautions, alternation of bites sips, secondary swallow, and intermittent throat clearing. Factors that may increase risk of adverse event in presence of aspiration [name] & [name] 2021): Poor general health and/or compromised immunity;Respiratory or GI disease;Reduced cognitive function;Limited mobility  Swallow Evaluation Recommendations Recommendations: PO diet PO Diet Recommendation: Regular;Thin liquids (Level 0) Liquid Administration via: Spoon;Cup;Straw Medication Administration:  (as tolerated) Supervision: Patient able to self-feed;Intermittent supervision/cueing for swallowing strategies Swallowing strategies  : Slow rate;Small bites/sips;Multiple dry swallows after each bite/sip;Follow solids with liquids;Clear throat intermittently Postural changes: Position pt fully upright for meals;Stay upright 30-60 min after meals Oral care recommendations: Oral care BID (2x/day)    [name], M.S., CCC-SLP Speech-Language Pathologist [facility] [phone] (ASCOM)   [name] 05/15/2023,1:54 PM

## 2023-05-23 DIAGNOSIS — R0902 Hypoxemia: Secondary | ICD-10-CM | POA: Diagnosis not present

## 2023-05-23 DIAGNOSIS — J8410 Pulmonary fibrosis, unspecified: Secondary | ICD-10-CM | POA: Diagnosis not present

## 2023-05-23 DIAGNOSIS — Z9981 Dependence on supplemental oxygen: Secondary | ICD-10-CM | POA: Diagnosis not present

## 2023-06-03 DIAGNOSIS — J449 Chronic obstructive pulmonary disease, unspecified: Secondary | ICD-10-CM | POA: Diagnosis not present

## 2023-06-03 DIAGNOSIS — M0579 Rheumatoid arthritis with rheumatoid factor of multiple sites without organ or systems involvement: Secondary | ICD-10-CM | POA: Diagnosis not present

## 2023-06-03 DIAGNOSIS — J849 Interstitial pulmonary disease, unspecified: Secondary | ICD-10-CM | POA: Diagnosis not present

## 2023-06-03 DIAGNOSIS — R5381 Other malaise: Secondary | ICD-10-CM | POA: Diagnosis not present

## 2023-06-05 DIAGNOSIS — R300 Dysuria: Secondary | ICD-10-CM | POA: Diagnosis not present

## 2023-06-05 DIAGNOSIS — D692 Other nonthrombocytopenic purpura: Secondary | ICD-10-CM | POA: Diagnosis not present

## 2023-06-05 DIAGNOSIS — E441 Mild protein-calorie malnutrition: Secondary | ICD-10-CM | POA: Diagnosis not present

## 2023-06-05 DIAGNOSIS — I5032 Chronic diastolic (congestive) heart failure: Secondary | ICD-10-CM | POA: Diagnosis not present

## 2023-06-05 DIAGNOSIS — I779 Disorder of arteries and arterioles, unspecified: Secondary | ICD-10-CM | POA: Diagnosis not present

## 2023-06-05 DIAGNOSIS — D329 Benign neoplasm of meninges, unspecified: Secondary | ICD-10-CM | POA: Diagnosis not present

## 2023-06-05 DIAGNOSIS — D849 Immunodeficiency, unspecified: Secondary | ICD-10-CM | POA: Diagnosis not present

## 2023-06-05 DIAGNOSIS — J8410 Pulmonary fibrosis, unspecified: Secondary | ICD-10-CM | POA: Diagnosis not present

## 2023-06-05 DIAGNOSIS — F3341 Major depressive disorder, recurrent, in partial remission: Secondary | ICD-10-CM | POA: Diagnosis not present

## 2023-06-05 DIAGNOSIS — J9611 Chronic respiratory failure with hypoxia: Secondary | ICD-10-CM | POA: Diagnosis not present

## 2023-06-05 DIAGNOSIS — Z8673 Personal history of transient ischemic attack (TIA), and cerebral infarction without residual deficits: Secondary | ICD-10-CM | POA: Diagnosis not present

## 2023-06-05 DIAGNOSIS — R7303 Prediabetes: Secondary | ICD-10-CM | POA: Diagnosis not present

## 2023-06-05 DIAGNOSIS — M0579 Rheumatoid arthritis with rheumatoid factor of multiple sites without organ or systems involvement: Secondary | ICD-10-CM | POA: Diagnosis not present

## 2023-06-05 DIAGNOSIS — E034 Atrophy of thyroid (acquired): Secondary | ICD-10-CM | POA: Diagnosis not present

## 2023-06-20 DIAGNOSIS — R0902 Hypoxemia: Secondary | ICD-10-CM | POA: Diagnosis not present

## 2023-06-20 DIAGNOSIS — J8410 Pulmonary fibrosis, unspecified: Secondary | ICD-10-CM | POA: Diagnosis not present

## 2023-06-20 DIAGNOSIS — Z9981 Dependence on supplemental oxygen: Secondary | ICD-10-CM | POA: Diagnosis not present

## 2023-07-07 DIAGNOSIS — Z79899 Other long term (current) drug therapy: Secondary | ICD-10-CM | POA: Diagnosis not present

## 2023-07-07 DIAGNOSIS — J188 Other pneumonia, unspecified organism: Secondary | ICD-10-CM | POA: Diagnosis not present

## 2023-07-07 DIAGNOSIS — T17500A Unspecified foreign body in bronchus causing asphyxiation, initial encounter: Secondary | ICD-10-CM | POA: Diagnosis not present

## 2023-07-07 DIAGNOSIS — M0579 Rheumatoid arthritis with rheumatoid factor of multiple sites without organ or systems involvement: Secondary | ICD-10-CM | POA: Diagnosis not present

## 2023-07-07 DIAGNOSIS — J189 Pneumonia, unspecified organism: Secondary | ICD-10-CM | POA: Diagnosis not present

## 2023-07-07 DIAGNOSIS — J479 Bronchiectasis, uncomplicated: Secondary | ICD-10-CM | POA: Diagnosis not present

## 2023-07-07 DIAGNOSIS — R053 Chronic cough: Secondary | ICD-10-CM | POA: Diagnosis not present

## 2023-07-21 DIAGNOSIS — Z9981 Dependence on supplemental oxygen: Secondary | ICD-10-CM | POA: Diagnosis not present

## 2023-07-21 DIAGNOSIS — R0902 Hypoxemia: Secondary | ICD-10-CM | POA: Diagnosis not present

## 2023-07-21 DIAGNOSIS — J8410 Pulmonary fibrosis, unspecified: Secondary | ICD-10-CM | POA: Diagnosis not present

## 2023-08-07 DIAGNOSIS — R399 Unspecified symptoms and signs involving the genitourinary system: Secondary | ICD-10-CM | POA: Diagnosis not present

## 2023-08-07 DIAGNOSIS — M0579 Rheumatoid arthritis with rheumatoid factor of multiple sites without organ or systems involvement: Secondary | ICD-10-CM | POA: Diagnosis not present

## 2023-08-07 DIAGNOSIS — Z8701 Personal history of pneumonia (recurrent): Secondary | ICD-10-CM | POA: Diagnosis not present

## 2023-08-07 DIAGNOSIS — J449 Chronic obstructive pulmonary disease, unspecified: Secondary | ICD-10-CM | POA: Diagnosis not present

## 2023-08-07 DIAGNOSIS — J479 Bronchiectasis, uncomplicated: Secondary | ICD-10-CM | POA: Diagnosis not present

## 2023-08-11 DIAGNOSIS — M170 Bilateral primary osteoarthritis of knee: Secondary | ICD-10-CM | POA: Diagnosis not present

## 2023-08-20 DIAGNOSIS — J8410 Pulmonary fibrosis, unspecified: Secondary | ICD-10-CM | POA: Diagnosis not present

## 2023-08-20 DIAGNOSIS — Z9981 Dependence on supplemental oxygen: Secondary | ICD-10-CM | POA: Diagnosis not present

## 2023-08-20 DIAGNOSIS — R0902 Hypoxemia: Secondary | ICD-10-CM | POA: Diagnosis not present

## 2023-09-09 DIAGNOSIS — J8410 Pulmonary fibrosis, unspecified: Secondary | ICD-10-CM | POA: Diagnosis not present

## 2023-09-09 DIAGNOSIS — F3341 Major depressive disorder, recurrent, in partial remission: Secondary | ICD-10-CM | POA: Diagnosis not present

## 2023-09-09 DIAGNOSIS — E7849 Other hyperlipidemia: Secondary | ICD-10-CM | POA: Diagnosis not present

## 2023-09-09 DIAGNOSIS — R7303 Prediabetes: Secondary | ICD-10-CM | POA: Diagnosis not present

## 2023-09-09 DIAGNOSIS — I779 Disorder of arteries and arterioles, unspecified: Secondary | ICD-10-CM | POA: Diagnosis not present

## 2023-09-09 DIAGNOSIS — D329 Benign neoplasm of meninges, unspecified: Secondary | ICD-10-CM | POA: Diagnosis not present

## 2023-09-09 DIAGNOSIS — J9611 Chronic respiratory failure with hypoxia: Secondary | ICD-10-CM | POA: Diagnosis not present

## 2023-09-09 DIAGNOSIS — E441 Mild protein-calorie malnutrition: Secondary | ICD-10-CM | POA: Diagnosis not present

## 2023-09-09 DIAGNOSIS — D849 Immunodeficiency, unspecified: Secondary | ICD-10-CM | POA: Diagnosis not present

## 2023-09-09 DIAGNOSIS — M0579 Rheumatoid arthritis with rheumatoid factor of multiple sites without organ or systems involvement: Secondary | ICD-10-CM | POA: Diagnosis not present

## 2023-09-09 DIAGNOSIS — I5032 Chronic diastolic (congestive) heart failure: Secondary | ICD-10-CM | POA: Diagnosis not present

## 2023-09-09 DIAGNOSIS — E034 Atrophy of thyroid (acquired): Secondary | ICD-10-CM | POA: Diagnosis not present

## 2023-09-09 DIAGNOSIS — Z0000 Encounter for general adult medical examination without abnormal findings: Secondary | ICD-10-CM | POA: Diagnosis not present

## 2023-09-09 DIAGNOSIS — E785 Hyperlipidemia, unspecified: Secondary | ICD-10-CM | POA: Diagnosis not present

## 2023-09-17 DIAGNOSIS — J449 Chronic obstructive pulmonary disease, unspecified: Secondary | ICD-10-CM | POA: Diagnosis not present

## 2023-09-17 DIAGNOSIS — R600 Localized edema: Secondary | ICD-10-CM | POA: Diagnosis not present

## 2023-09-17 DIAGNOSIS — G4762 Sleep related leg cramps: Secondary | ICD-10-CM | POA: Diagnosis not present

## 2023-09-17 DIAGNOSIS — I5032 Chronic diastolic (congestive) heart failure: Secondary | ICD-10-CM | POA: Diagnosis not present

## 2023-09-20 DIAGNOSIS — R0902 Hypoxemia: Secondary | ICD-10-CM | POA: Diagnosis not present

## 2023-09-20 DIAGNOSIS — J8410 Pulmonary fibrosis, unspecified: Secondary | ICD-10-CM | POA: Diagnosis not present

## 2023-09-20 DIAGNOSIS — Z9981 Dependence on supplemental oxygen: Secondary | ICD-10-CM | POA: Diagnosis not present

## 2023-09-25 DIAGNOSIS — J8410 Pulmonary fibrosis, unspecified: Secondary | ICD-10-CM | POA: Diagnosis not present

## 2023-09-25 DIAGNOSIS — R7303 Prediabetes: Secondary | ICD-10-CM | POA: Diagnosis not present

## 2023-09-25 DIAGNOSIS — M0579 Rheumatoid arthritis with rheumatoid factor of multiple sites without organ or systems involvement: Secondary | ICD-10-CM | POA: Diagnosis not present

## 2023-09-25 DIAGNOSIS — E034 Atrophy of thyroid (acquired): Secondary | ICD-10-CM | POA: Diagnosis not present

## 2023-09-25 DIAGNOSIS — I5032 Chronic diastolic (congestive) heart failure: Secondary | ICD-10-CM | POA: Diagnosis not present

## 2023-09-25 DIAGNOSIS — E7849 Other hyperlipidemia: Secondary | ICD-10-CM | POA: Diagnosis not present

## 2023-09-25 DIAGNOSIS — J9611 Chronic respiratory failure with hypoxia: Secondary | ICD-10-CM | POA: Diagnosis not present

## 2023-10-01 DIAGNOSIS — R399 Unspecified symptoms and signs involving the genitourinary system: Secondary | ICD-10-CM | POA: Diagnosis not present

## 2023-10-14 DIAGNOSIS — Z79899 Other long term (current) drug therapy: Secondary | ICD-10-CM | POA: Diagnosis not present

## 2023-10-14 DIAGNOSIS — M0579 Rheumatoid arthritis with rheumatoid factor of multiple sites without organ or systems involvement: Secondary | ICD-10-CM | POA: Diagnosis not present

## 2023-10-17 DIAGNOSIS — I5032 Chronic diastolic (congestive) heart failure: Secondary | ICD-10-CM | POA: Diagnosis not present

## 2023-10-20 DIAGNOSIS — R0902 Hypoxemia: Secondary | ICD-10-CM | POA: Diagnosis not present

## 2023-10-20 DIAGNOSIS — Z9981 Dependence on supplemental oxygen: Secondary | ICD-10-CM | POA: Diagnosis not present

## 2023-10-20 DIAGNOSIS — J8410 Pulmonary fibrosis, unspecified: Secondary | ICD-10-CM | POA: Diagnosis not present

## 2023-10-22 DIAGNOSIS — R7303 Prediabetes: Secondary | ICD-10-CM | POA: Diagnosis not present

## 2023-10-22 DIAGNOSIS — I5032 Chronic diastolic (congestive) heart failure: Secondary | ICD-10-CM | POA: Diagnosis not present

## 2023-10-22 DIAGNOSIS — J9611 Chronic respiratory failure with hypoxia: Secondary | ICD-10-CM | POA: Diagnosis not present

## 2023-10-22 DIAGNOSIS — J8410 Pulmonary fibrosis, unspecified: Secondary | ICD-10-CM | POA: Diagnosis not present

## 2023-10-22 DIAGNOSIS — E7849 Other hyperlipidemia: Secondary | ICD-10-CM | POA: Diagnosis not present

## 2023-10-29 DIAGNOSIS — B351 Tinea unguium: Secondary | ICD-10-CM | POA: Diagnosis not present

## 2023-10-29 DIAGNOSIS — M79671 Pain in right foot: Secondary | ICD-10-CM | POA: Diagnosis not present

## 2023-10-29 DIAGNOSIS — L851 Acquired keratosis [keratoderma] palmaris et plantaris: Secondary | ICD-10-CM | POA: Diagnosis not present

## 2023-10-29 DIAGNOSIS — M0579 Rheumatoid arthritis with rheumatoid factor of multiple sites without organ or systems involvement: Secondary | ICD-10-CM | POA: Diagnosis not present

## 2023-10-29 DIAGNOSIS — M79672 Pain in left foot: Secondary | ICD-10-CM | POA: Diagnosis not present

## 2023-10-31 DIAGNOSIS — R399 Unspecified symptoms and signs involving the genitourinary system: Secondary | ICD-10-CM | POA: Diagnosis not present

## 2023-11-10 ENCOUNTER — Encounter: Payer: Self-pay | Admitting: Physician Assistant

## 2023-11-10 ENCOUNTER — Ambulatory Visit: Admitting: Physician Assistant

## 2023-11-10 VITALS — BP 138/81 | HR 77 | Ht 65.5 in | Wt 134.0 lb

## 2023-11-10 DIAGNOSIS — I69351 Hemiplegia and hemiparesis following cerebral infarction affecting right dominant side: Secondary | ICD-10-CM | POA: Diagnosis not present

## 2023-11-10 DIAGNOSIS — J479 Bronchiectasis, uncomplicated: Secondary | ICD-10-CM | POA: Diagnosis not present

## 2023-11-10 DIAGNOSIS — R339 Retention of urine, unspecified: Secondary | ICD-10-CM

## 2023-11-10 DIAGNOSIS — G20A1 Parkinson's disease without dyskinesia, without mention of fluctuations: Secondary | ICD-10-CM | POA: Diagnosis not present

## 2023-11-10 DIAGNOSIS — R3989 Other symptoms and signs involving the genitourinary system: Secondary | ICD-10-CM | POA: Diagnosis not present

## 2023-11-10 DIAGNOSIS — R81 Glycosuria: Secondary | ICD-10-CM | POA: Diagnosis not present

## 2023-11-10 DIAGNOSIS — N390 Urinary tract infection, site not specified: Secondary | ICD-10-CM | POA: Diagnosis not present

## 2023-11-10 DIAGNOSIS — J9611 Chronic respiratory failure with hypoxia: Secondary | ICD-10-CM | POA: Diagnosis not present

## 2023-11-10 DIAGNOSIS — I69322 Dysarthria following cerebral infarction: Secondary | ICD-10-CM | POA: Diagnosis not present

## 2023-11-10 DIAGNOSIS — I69354 Hemiplegia and hemiparesis following cerebral infarction affecting left non-dominant side: Secondary | ICD-10-CM | POA: Diagnosis not present

## 2023-11-10 DIAGNOSIS — I69393 Ataxia following cerebral infarction: Secondary | ICD-10-CM | POA: Diagnosis not present

## 2023-11-10 LAB — URINALYSIS, COMPLETE
Bilirubin, UA: NEGATIVE
Ketones, UA: NEGATIVE
Leukocytes,UA: NEGATIVE
Nitrite, UA: NEGATIVE
Protein,UA: NEGATIVE
RBC, UA: NEGATIVE
Specific Gravity, UA: 1.02 (ref 1.005–1.030)
Urobilinogen, Ur: 0.2 mg/dL (ref 0.2–1.0)
pH, UA: 6.5 (ref 5.0–7.5)

## 2023-11-10 LAB — MICROSCOPIC EXAMINATION: Bacteria, UA: NONE SEEN

## 2023-11-10 NOTE — Patient Instructions (Signed)
 Recurrent UTI Prevention Strategies  Continue urinating every 2 hours during the daytime. I want you to try to go even if you don't feel like you need to go. Start a bowel regimen to manage your constipation. Your goal is to have consistent, formed bowel movements that are easy for you to pass. You may use either of the over-the-counter supplements Benefiber or Miralax  to help with this. I recommend that you try Benefiber first and move on to Miralax  if this is not helping you enough. You may adjust the recommended dose of Miralax  (one capful daily) to achieve this goal. Start taking an over-the-counter cranberry supplement for urinary tract health. Take this once or twice daily on an empty stomach, e.g. right before bed. Start taking an over-the-counter probiotic containing the bacterial species called Lactobacillus. Take this daily.

## 2023-11-10 NOTE — Progress Notes (Signed)
 11/10/2023 12:24 PM   [name] 02-Oct-1945 [id number]  CC: Chief Complaint  Patient presents with   Recurrent UTI   HPI: [name] is a 78 y.o. female with PMH bladder stones who presents today for evaluation of recurrent UTI.   Today she reports frequent UTIs, symptoms including itching and burning.  The symptoms fully resolve with antibiotics, then tend to recur within a few days.  She is currently on Macrobid  for her most recent infection.  She engages in timed voiding every 2 hours during the daytime.  She has a history of chronic constipation, and took Colace last week.  In-office catheterized UA today positive for 3+ glucose; urine microscopy pan negative.  Measured residual .  PMH: Past Medical History:  Diagnosis Date   Collagen vascular disease (HCC)    Depression    Hyperlipidemia    Muscle pain    Neuropathy    RA (rheumatoid arthritis) (HCC)    Thyroid  disease    Vitamin B 12 deficiency     Surgical History: Past Surgical History:  Procedure Laterality Date   ABDOMINAL HYSTERECTOMY     BREAST BIOPSY Left    2 benign biopsies   ECTOPIC PREGNANCY SURGERY     KNEE SURGERY Right    LAMINECTOMY     leg vein stripping Bilateral    TONSILLECTOMY      Home Medications:  Allergies as of 11/10/2023       Reactions   Codeine Nausea And Vomiting, Other (See Comments)   Demerol [meperidine] Nausea And Vomiting        Medication List        Accurate as of November 10, 2023 12:24 PM. If you have any questions, ask your nurse or doctor.          adalimumab 40 MG/0.8ML prefilled syringe Commonly known as: HUMIRA Inject 40 mg into the skin every 14 (fourteen) days.   albuterol  108 (90 Base) MCG/ACT inhaler Commonly known as: VENTOLIN  HFA Inhale 1-2 puffs into the lungs every 6 (six) hours as needed for wheezing or shortness of breath.   aspirin  EC 81 MG tablet Take 81 mg by mouth daily.   atorvastatin  40 MG tablet Commonly known  as: LIPITOR Take 1 tablet (40 mg total) by mouth daily at 6 PM.   clopidogrel  75 MG tablet Commonly known as: PLAVIX  Take 1 tablet (75 mg total) by mouth daily.   folic acid  1 MG tablet Commonly known as: FOLVITE  Take 1 mg by mouth daily.   ipratropium-albuterol  0.5-2.5 (3) MG/3ML Soln Commonly known as: DUONEB Inhale 3 mLs into the lungs 2 (two) times daily as needed (wheezing or shortness of breath).   lamoTRIgine  100 MG tablet Commonly known as: LAMICTAL  Take 100 mg by mouth daily.   levothyroxine  112 MCG tablet Commonly known as: SYNTHROID  Take 1 tablet (112 mcg total) by mouth daily.   methotrexate  2.5 MG tablet Commonly known as: RHEUMATREX Take 20 mg by mouth every Saturday.   mirtazapine  15 MG tablet Commonly known as: REMERON  Take 15 mg by mouth at bedtime.   nystatin cream Commonly known as: MYCOSTATIN Apply 1 Application topically 2 (two) times daily.   pantoprazole  40 MG tablet Commonly known as: PROTONIX  Take 40 mg by mouth.   potassium chloride  8 MEQ tablet Commonly known as: KLOR-CON  Take 8 mEq by mouth daily.   predniSONE 5 MG tablet Commonly known as: DELTASONE Take 2.5 mg by mouth daily.   torsemide  20 MG  tablet Commonly known as: DEMADEX  Take 1 tablet (20 mg total) by mouth daily.   venlafaxine  XR 150 MG 24 hr capsule Commonly known as: EFFEXOR -XR Take 150 mg by mouth daily.   Vitamin D  (Ergocalciferol ) 50000 units Caps Take 1 capsule by mouth once a week.        Allergies:  Allergies  Allergen Reactions   Codeine Nausea And Vomiting and Other (See Comments)   Demerol [Meperidine] Nausea And Vomiting    Family History: Family History  Problem Relation Age of Onset   COPD Father    Cancer Father    Breast cancer Cousin    Breast cancer Maternal Aunt    Breast cancer Maternal Aunt     Social History:   reports that she has never smoked. She has never been exposed to tobacco smoke. She has never used smokeless tobacco. No  history on file for alcohol  use and drug use.  Physical Exam: BP 138/81   Pulse 77   Ht 5' 5.5 (1.664 m)   Wt 134 lb (60.8 kg)   SpO2 92%   BMI 21.96 kg/m   Constitutional:  Alert and oriented, no acute distress, nontoxic appearing HEENT: [place], AT Cardiovascular: No clubbing, cyanosis, or edema Respiratory: Normal respiratory effort, no increased work of breathing Skin: No rashes, bruises or suspicious lesions Neurologic: Grossly intact, no focal deficits, moving all 4 extremities Psychiatric: Normal mood and affect  Laboratory Data: Results for orders placed or performed in visit on 11/10/23  Microscopic Examination   Collection Time: 11/10/23 12:10 PM   Urine  Result Value Ref Range   WBC, UA 0-5 0 - 5 /hpf   RBC, Urine 0-2 0 - 2 /hpf   Epithelial Cells (non renal) 0-10 0 - 10 /hpf   Bacteria, UA None seen None seen/Few  Urinalysis, Complete   Collection Time: 11/10/23 12:10 PM  Result Value Ref Range   Specific Gravity, UA 1.020 1.005 - 1.030   pH, UA 6.5 5.0 - 7.5   Color, UA Yellow Yellow   Appearance Ur Clear Clear   Leukocytes,UA Negative Negative   Protein,UA Negative Negative/Trace   Glucose, UA 3+ (A) Negative   Ketones, UA Negative Negative   RBC, UA Negative Negative   Bilirubin, UA Negative Negative   Urobilinogen, Ur 0.2 0.2 - 1.0 mg/dL   Nitrite, UA Negative Negative   Microscopic Examination Comment    Microscopic Examination See below:   Hemoglobin A1c   Collection Time: 11/10/23  1:03 PM  Result Value Ref Range   Hgb A1c MFr Bld 5.9 (H) 4.8 - 5.6 %   Est. average glucose Bld gHb Est-mCnc 123 mg/dL  Basic metabolic panel with GFR   Collection Time: 11/10/23  1:03 PM  Result Value Ref Range   Glucose 92 70 - 99 mg/dL   BUN 20 8 - 27 mg/dL   Creatinine, Ser 9.16 0.57 - 1.00 mg/dL   eGFR 72 >40 fO/fpw/8.26   BUN/Creatinine Ratio 24 12 - 28   Sodium 142 134 - 144 mmol/L   Potassium 4.9 3.5 - 5.2 mmol/L   Chloride 107 (H) 96 - 106 mmol/L   CO2  19 (L) 20 - 29 mmol/L   Calcium  9.0 8.7 - 10.3 mg/dL   Assessment & Plan:   1. Recurrent UTI (Primary) UA bland today, no evidence of active infection.  I recommended recurrent UTI prevention strategies including a bowel regimen, cranberry supplements, and probiotics.  May consider suppressive antibiotics in the future. -  Urinalysis, Complete  2. Glucosuria 3+ glucosuria on UA today, no known history of diabetes and not on SGLT2 inhibitors.  Will check an A1c today.  Undiagnosed diabetes could be contributing to her recurrent infections. - Hemoglobin A1c  3. Incomplete bladder emptying She was unable to void on arrival and measured residuals 400 cc, consistent with her history of bladder stones.  We discussed various options for management of incomplete bladder emptying including CIC versus chronic Foley.  She does not wish to pursue either of these at this time.  Will check her renal function today.  We discussed that indications for more aggressive bladder management would include symptomatic retention, continued frequent UTIs, or worsening renal function. - Basic metabolic panel with GFR   Return if symptoms worsen or fail to improve.  Lucie Hones, PA-C  [facility] [place] [address] [place], [place] [address] [phone]

## 2023-11-10 NOTE — Progress Notes (Signed)
 In and Out Catheterization  Patient is present today for a I & O catheterization due to Recurrent UTI. Patient was cleaned and prepped in a sterile fashion with betadine . A 14FR cath was inserted no complications were noted , of urine return was noted, urine was dark yellow in color. A clean urine sample was collected for Recurrent UTI. Bladder was drained  And catheter was removed with out difficulty.    Performed by: Laymon Ned, CMA and Macario Pinal, RN  Follow up/ Additional notes: no.

## 2023-11-11 ENCOUNTER — Ambulatory Visit: Payer: Self-pay | Admitting: Physician Assistant

## 2023-11-11 ENCOUNTER — Telehealth: Payer: Self-pay

## 2023-11-11 DIAGNOSIS — B379 Candidiasis, unspecified: Secondary | ICD-10-CM

## 2023-11-11 DIAGNOSIS — R81 Glycosuria: Secondary | ICD-10-CM

## 2023-11-11 LAB — BASIC METABOLIC PANEL WITH GFR
BUN/Creatinine Ratio: 24 (ref 12–28)
BUN: 20 mg/dL (ref 8–27)
CO2: 19 mmol/L — ABNORMAL LOW (ref 20–29)
Calcium: 9 mg/dL (ref 8.7–10.3)
Chloride: 107 mmol/L — ABNORMAL HIGH (ref 96–106)
Creatinine, Ser: 0.83 mg/dL (ref 0.57–1.00)
Glucose: 92 mg/dL (ref 70–99)
Potassium: 4.9 mmol/L (ref 3.5–5.2)
Sodium: 142 mmol/L (ref 134–144)
eGFR: 72 mL/min/1.73 (ref >59)

## 2023-11-11 LAB — HEMOGLOBIN A1C
Est. average glucose Bld gHb Est-mCnc: 123 mg/dL
Hgb A1c MFr Bld: 5.9 % — ABNORMAL HIGH (ref 4.8–5.6)

## 2023-11-11 MED ORDER — FLUCONAZOLE 150 MG PO TABS: 150.0000 mg | ORAL_TABLET | Freq: Once | ORAL | 0 refills | Status: AC

## 2023-11-11 NOTE — Telephone Encounter (Signed)
 Patient called in today after her appointment yester with complaints of a possible yeast infection. With burning and itching when wiping. Will be putting in a Rx for difulcan for patient to take.

## 2023-11-11 NOTE — Telephone Encounter (Signed)
 Discuss patients lab results Per Sam PA and encouraged her to F/U with her PCP

## 2023-11-13 DIAGNOSIS — M2041 Other hammer toe(s) (acquired), right foot: Secondary | ICD-10-CM | POA: Diagnosis not present

## 2023-11-13 DIAGNOSIS — L84 Corns and callosities: Secondary | ICD-10-CM | POA: Diagnosis not present

## 2023-11-13 DIAGNOSIS — M19071 Primary osteoarthritis, right ankle and foot: Secondary | ICD-10-CM | POA: Diagnosis not present

## 2023-11-13 DIAGNOSIS — Z8739 Personal history of other diseases of the musculoskeletal system and connective tissue: Secondary | ICD-10-CM | POA: Diagnosis not present

## 2023-11-13 DIAGNOSIS — L97511 Non-pressure chronic ulcer of other part of right foot limited to breakdown of skin: Secondary | ICD-10-CM | POA: Diagnosis not present

## 2023-11-13 DIAGNOSIS — L97521 Non-pressure chronic ulcer of other part of left foot limited to breakdown of skin: Secondary | ICD-10-CM | POA: Diagnosis not present

## 2023-11-13 DIAGNOSIS — M216X1 Other acquired deformities of right foot: Secondary | ICD-10-CM | POA: Diagnosis not present

## 2023-11-13 DIAGNOSIS — B351 Tinea unguium: Secondary | ICD-10-CM | POA: Diagnosis not present

## 2023-11-13 DIAGNOSIS — M21071 Valgus deformity, not elsewhere classified, right ankle: Secondary | ICD-10-CM | POA: Diagnosis not present

## 2023-11-13 DIAGNOSIS — L603 Nail dystrophy: Secondary | ICD-10-CM | POA: Diagnosis not present

## 2023-11-13 DIAGNOSIS — L909 Atrophic disorder of skin, unspecified: Secondary | ICD-10-CM | POA: Diagnosis not present

## 2023-11-13 DIAGNOSIS — M7741 Metatarsalgia, right foot: Secondary | ICD-10-CM | POA: Diagnosis not present

## 2023-11-18 NOTE — Progress Notes (Signed)
 New Patient Visit   Chief Complaint:  Chief Complaint  Patient presents with  . Shortness of Breath    Patient stated almost everyday   . Fatigue    Patient stated frequently   . Dizziness    Occas but patient has inner ear problems    Date of Service: 11/25/2023 Date of Birth: 08-Mar-1946 PCP: Fernande Ophelia Marinell DOUGLAS, MD   History of Present Illness: Ms. Solana is a 78 y.o. female patient with PMH of chronic diastolic CHF, pulmonary fibrosis, COPD, rheumatoid arthritis, pre-diabetes, hypothyroidism, hyperlipidemia, OSA, hx CVA who was referred by Marval Gum, NP for evaluation of leg swelling and elevated BNP. She was last seen by myself on 10/22/23.   She reports to clinic today stating she has been doing better from a cardiac standpoint since her last appointment.  States that over her breathing is stable, endorses occasional cough with productive sputum.  States that her lower extremity edema is overall improved since her last appointment, only has some minimal discomfort and swelling in her feet which she is following with podiatry for.  Denies any anginal symptoms.  Of note, patient has been having issues with frequent UTIs since earlier this year.  Planning to see urology tomorrow.  Past Medical and Surgical History  Past Medical History:  Past Medical History:  Diagnosis Date  . Depression    with anxiety, followed by Dr. Laurence  . Hyperlipidemia    statin therapy initiated 8/14  . Hypothyroid   . Osteoarthritis   . Other abnormal glucose 11/25/2013  . Rheumatoid arthritis with rheumatoid factor (CMS/HHS-HCC)    Seropositive positive anti CCP antibodies.  Methotrexate .  Status post sulfasalazine with no response.  Status post Enbrel.  Status post Orencia with loss of efficacy.  Status post Remicade with reaction.  Humira.  . Sleep apnea    CPAP initiated 6/13.  Pt did not tolerate, and turned in her machine 2014.  SABRA Syncope    Episode 2008. Evaluated at [facility], Myoview negative, carotid dopplers negative. Thought to be vaso vagal.    Past Surgical History:  She  has a past surgical history that includes Breast surgery; Hysterectomy; Status post ectopic pregnancy; Vein stripping; Status post laminectomy; and Cataract extraction (Bilateral).   Medications and Allergies  Current Medications:  Current Outpatient Medications  Medication Sig Dispense Refill  . adalimumab (HUMIRA,CF, PEN) 40 mg/0.4 mL pen injector kit Inject 40 mg subcutaneously every 14 (fourteen) days 3 kit 8  . albuterol  MDI, PROVENTIL , VENTOLIN , PROAIR , HFA 90 mcg/actuation inhaler INHALE 2 PUFFS BY MOUTH TWICE DAILY 18 g 0  . atorvastatin  (LIPITOR) 40 MG tablet Take 1 tablet (40 mg total) by mouth every evening 90 tablet 1  . azithromycin  (ZITHROMAX ) 250 MG tablet Take 1 tablet (250 mg total) by mouth every Monday, Wednesday, and Friday 39 tablet 3  . clopidogreL  (PLAVIX ) 75 mg tablet TAKE 1 TABLET BY MOUTH ONCE DAILY 90 tablet 1  . ergocalciferol , vitamin D2, 1,250 mcg (50,000 unit) capsule TAKE 1 CAPSULE BY MOUTH ONCE A WEEK 12 capsule 1  . FARXIGA 10 mg tablet Take 1 tablet (10 mg total) by mouth once daily 30 tablet 11  . folic acid  (FOLVITE ) 1 MG tablet Take 1 tablet (1,000 mcg total) by mouth once daily for 360 days 90 tablet 3  . FUROsemide  (LASIX ) 20 MG tablet Take 1 tablet (20 mg total) by mouth once daily Take twice daily for next 3 days then decrease to once  daily. (Patient taking differently: Take 20 mg by mouth as needed for Edema Take twice daily for next 3 days then decrease to once daily.) 30 tablet 11  . hydroxychloroquine (PLAQUENIL) 200 mg tablet TAKE 1 TABLET BY MOUTH TWICE DAILY ON MONDAY  WEDNESDAY AND FRIDAY  TAKE 1 TABLET ALL OTHER DAYS. WITH FOOD 165 tablet 1  . lamoTRIgine  (LAMICTAL ) 100 MG tablet Take 1 tablet (100 mg total) by mouth once daily 90 tablet 3  . levothyroxine  (SYNTHROID ) 75 MCG tablet Take 1 tablet (75 mcg total) by mouth once daily Take  on an empty stomach with a glass of water at least 30-60 minutes before breakfast. 30 tablet 11  . methotrexate  (RHEUMATREX) 2.5 MG tablet Take 8 tablets (20 mg total) by mouth every 7 (seven) days 96 tablet 1  . mirtazapine  (REMERON ) 15 MG tablet Take 1 tablet (15 mg total) by mouth at bedtime 90 tablet 1  . nystatin (MYCOSTATIN) 100,000 unit/gram powder Apply topically 2 (two) times daily    . ondansetron  (ZOFRAN -ODT) 4 MG disintegrating tablet Take 1 tablet (4 mg total) by mouth every 8 (eight) hours as needed for Nausea 20 tablet 6  . pantoprazole  (PROTONIX ) 40 MG DR tablet TAKE 1 TABLET BY MOUTH ONCE DAILY 90 tablet 1  . potassium chloride  (KLOR-CON ) 10 MEQ ER tablet Take 1 tablet (10 mEq total) by mouth once daily as needed 30 tablet 11  . spironolactone  (ALDACTONE ) 25 MG tablet Take 1 tablet (25 mg total) by mouth once daily 30 tablet 11  . venlafaxine  (EFFEXOR -XR) 150 MG XR capsule TAKE 1 CAPSULE BY MOUTH ONCE DAILY 90 capsule 1   No current facility-administered medications for this visit.    Allergies: Codeine and Meperidine  Social and Family History  Social History:   reports that she quit smoking about 13 years ago. Her smoking use included cigarettes. She started smoking about 59 years ago. She has never used smokeless tobacco. She reports that she does not drink alcohol  and does not use drugs.  Family History:  Family History  Problem Relation Name Age of Onset  . No Known Problems Mother    . Cancer Father      Review of Systems  Positive for dysuria, productive cough Review of Systems: negative for weight gain, weight loss, weakness, fatigue, vision change, hearing loss, cough, congestion, PND, orthopnea, heartburn, nausea, vomiting, diarrhea, bloody stools, melena, stomach pain, leg weakness, leg pain, leg blood clots leg cramping, headache, blackouts, nosebleed, trouble swallowing, frequent urination, urination at night, skin rashes, skin lesions, muscle weakness,  numbness, tingling, anxiety, depression  Physical Examination  Vitals:BP 120/80 (BP Location: Left upper arm, Patient Position: Sitting, BP Cuff Size: Adult)   Pulse 73   Resp 12   Ht 152.4 cm (5')   Wt 60.8 kg (134 lb) Comment: verbalized  SpO2 93%   BMI 26.17 kg/m  Ht: 152.4 cm (5') Wt: 60.8 kg (134 lb) BSA: Body surface area is 1.6 meters squared. Body mass index is 26.17 kg/m.  Appearance: Well appearing, no acute distress. HEENT: Pupils equally reactive to light and accomodation, no apparent xanthelasma or other lesions. Neck: Supple without apparent masses or lymphadenopathy. Lungs: Normal respiratory effort. No wheezes, no crackles, no rhonchi. Heart: Regular rate and rhythm. Normal S1, S2. No gallops, murmurs, no rub. PMI normal size and placement. Carotid upstroke normal without bruit. Jugular venous pressure is normal. Abdomen: Soft, nontender, non-distended, with normal bowel sounds. Abdominal aorta is normal size without bruit. No apparent  masses. Extremities: 1+ pitting edema bilateral feet and ankle. No cyanosis, no clubbing, no ulcers. Peripheral Pulses: 2+ in all extremities, 2+ femoral pulses bilaterally, 2+ DP pulses. Musculoskeletal:  Normal muscle tone without kyphosis. Neurological: Alert and oriented to time, place, and person.  Assessment   78 y.o. female with  Encounter Diagnoses  Name Primary?  . Chronic diastolic CHF (congestive heart failure) (CMS/HHS-HCC) Yes  . Pulmonary fibrosis (CMS/HHS-HCC)   . Chronic respiratory failure with hypoxia (CMS/HHS-HCC)   . Other hyperlipidemia   . Prediabetes    Plan   Chronic diastolic heart failure: NYHA class III. Improvement in LE edema and SOB. Echo 09/2023 with EF >55%, mild LVH, grade I diastolic dysfunction, moderate TR with RVSP 58 mmHg.  -Continue farxiga 10 mg daily, spironolactone  25 mg daily. Lasix  20 mg prn.  Hypertension: BP controlled in clinic today at 120/80. Patient is monitoring this at home.  Home systolic readings 120s.   -Consider addition of ARB at future appointment.   -Recommended daily BP monitoring, patient to contact our office if BP running consistently >140/90.   Hyperlipidemia: Most recent lipid panel with total cholesterol 131, LDL 59, triglycerides 39 from 08/2023.   -Continue atorvastatin  40 mg daily.   Hx CVA: Dx 08/2020. Followed by neurology. -Continue atorvastatin , plavix .  COPD, pulmonary fibrosis: Managed by pulmonology.  -Continue following with pulm.     No orders of the defined types were placed in this encounter.   Return in about 3 months (around 02/25/2024) for [place].   Attestation Statement:   I personally performed the service, non-incident to. (WP)   CARALYN REBEKAH HUDSON, PA  Signed: Caralyn Pleasant Bloch, PA [facility]

## 2023-11-24 ENCOUNTER — Telehealth: Payer: Self-pay

## 2023-11-24 NOTE — Telephone Encounter (Signed)
 Spoke with patient and it seems she is have new onset Uti symptoms over past 2 days. Mainly buring and pain with urination. We discussed options. I Suggested that she be seen in Urgent care to get quicker help as I wasn't able to offer her a same day appointment for 2 days away. I then spoke with her husband and he's going to go home and speak with her to get a game plan on what they will do. If they decide on urgent care sooner then they were asked to call back and cancel the scheduled appointment.

## 2023-11-25 DIAGNOSIS — R7303 Prediabetes: Secondary | ICD-10-CM | POA: Diagnosis not present

## 2023-11-25 DIAGNOSIS — J8410 Pulmonary fibrosis, unspecified: Secondary | ICD-10-CM | POA: Diagnosis not present

## 2023-11-25 DIAGNOSIS — E7849 Other hyperlipidemia: Secondary | ICD-10-CM | POA: Diagnosis not present

## 2023-11-25 DIAGNOSIS — J9611 Chronic respiratory failure with hypoxia: Secondary | ICD-10-CM | POA: Diagnosis not present

## 2023-11-25 DIAGNOSIS — I5032 Chronic diastolic (congestive) heart failure: Secondary | ICD-10-CM | POA: Diagnosis not present

## 2023-11-26 ENCOUNTER — Encounter: Payer: Self-pay | Admitting: Physician Assistant

## 2023-11-26 ENCOUNTER — Ambulatory Visit (INDEPENDENT_AMBULATORY_CARE_PROVIDER_SITE_OTHER): Admitting: Physician Assistant

## 2023-11-26 VITALS — BP 105/65 | HR 69 | Ht 65.0 in | Wt 132.0 lb

## 2023-11-26 DIAGNOSIS — N390 Urinary tract infection, site not specified: Secondary | ICD-10-CM | POA: Diagnosis not present

## 2023-11-26 DIAGNOSIS — R3989 Other symptoms and signs involving the genitourinary system: Secondary | ICD-10-CM | POA: Diagnosis not present

## 2023-11-26 DIAGNOSIS — R81 Glycosuria: Secondary | ICD-10-CM | POA: Diagnosis not present

## 2023-11-26 LAB — URINALYSIS, COMPLETE
Bilirubin, UA: NEGATIVE
Ketones, UA: NEGATIVE
Leukocytes,UA: NEGATIVE
Nitrite, UA: POSITIVE — AB
Specific Gravity, UA: 1.025 (ref 1.005–1.030)
Urobilinogen, Ur: 0.2 mg/dL (ref 0.2–1.0)
pH, UA: 6 (ref 5.0–7.5)

## 2023-11-26 LAB — MICROSCOPIC EXAMINATION: WBC, UA: >30 /HPF — AB (ref 0–5)

## 2023-11-26 MED ORDER — AMOXICILLIN 875 MG PO TABS
875.0000 mg | ORAL_TABLET | Freq: Two times a day (BID) | ORAL | 0 refills | Status: DC
Start: 2023-11-26 — End: 2023-12-18

## 2023-11-26 NOTE — Progress Notes (Signed)
 11/26/2023 1:57 PM   [name] December 24, 1945 [id number]  CC: Chief Complaint  Patient presents with   Recurrent UTI   HPI: [name] is a 78 y.o. female with PMH incomplete bladder emptying, recurrent UTI, prediabetes, and diastolic CHF on Farxiga who presents today for evaluation of recurrent UTI.  She is accompanied today by her husband, Vicenta, who contributes to HPI.  I saw her in clinic most recently on 11/10/2023 for the same.  She did not want to pursue chronic Foley or CIC for management of her incomplete emptying.  She was noted to have 3+ glucosuria, though she did not recall being on an SGLT2 inhibitor.  Her UA was otherwise bland.  We ended up treating her empirically for a yeast infection with Diflucan  150 mg x 1 dose.  Today she reports 4 days of dysuria and nausea.  She denies fever, chills, or vomiting.  Vicenta reports she was started on Farxiga last month.  She was having frequent UTIs prior to initiating this medication.  Vicenta wonders about daily low-dose antibiotics.  In-office UA today positive for 3+ glucose, 2+ protein, 1+ blood, and nitrites; urine microscopy with >30 WBCs/HPF, 3-10 RBCs/HPF, and moderate bacteria.  PMH: Past Medical History:  Diagnosis Date   Collagen vascular disease (HCC)    Depression    Hyperlipidemia    Muscle pain    Neuropathy    RA (rheumatoid arthritis) (HCC)    Thyroid  disease    Vitamin B 12 deficiency     Surgical History: Past Surgical History:  Procedure Laterality Date   ABDOMINAL HYSTERECTOMY     BREAST BIOPSY Left    2 benign biopsies   ECTOPIC PREGNANCY SURGERY     KNEE SURGERY Right    LAMINECTOMY     leg vein stripping Bilateral    TONSILLECTOMY      Home Medications:  Allergies as of 11/26/2023       Reactions   Codeine Nausea And Vomiting, Other (See Comments)   Demerol [meperidine] Nausea And Vomiting        Medication List        Accurate as of November 26, 2023  1:57 PM. If you  have any questions, ask your nurse or doctor.          adalimumab 40 MG/0.8ML prefilled syringe Commonly known as: HUMIRA Inject 40 mg into the skin every 14 (fourteen) days.   albuterol  108 (90 Base) MCG/ACT inhaler Commonly known as: VENTOLIN  HFA Inhale 1-2 puffs into the lungs every 6 (six) hours as needed for wheezing or shortness of breath.   aspirin  EC 81 MG tablet Take 81 mg by mouth daily.   atorvastatin  40 MG tablet Commonly known as: LIPITOR Take 1 tablet (40 mg total) by mouth daily at 6 PM.   clopidogrel  75 MG tablet Commonly known as: PLAVIX  Take 1 tablet (75 mg total) by mouth daily.   folic acid  1 MG tablet Commonly known as: FOLVITE  Take 1 mg by mouth daily.   ipratropium-albuterol  0.5-2.5 (3) MG/3ML Soln Commonly known as: DUONEB Inhale 3 mLs into the lungs 2 (two) times daily as needed (wheezing or shortness of breath).   lamoTRIgine  100 MG tablet Commonly known as: LAMICTAL  Take 100 mg by mouth daily.   levothyroxine  112 MCG tablet Commonly known as: SYNTHROID  Take 1 tablet (112 mcg total) by mouth daily.   methotrexate  2.5 MG tablet Commonly known as: RHEUMATREX Take 20 mg by mouth every Saturday.  mirtazapine  15 MG tablet Commonly known as: REMERON  Take 15 mg by mouth at bedtime.   nystatin cream Commonly known as: MYCOSTATIN Apply 1 Application topically 2 (two) times daily.   pantoprazole  40 MG tablet Commonly known as: PROTONIX  Take 40 mg by mouth.   potassium chloride  8 MEQ tablet Commonly known as: KLOR-CON  Take 8 mEq by mouth daily.   predniSONE 5 MG tablet Commonly known as: DELTASONE Take 2.5 mg by mouth daily.   torsemide  20 MG tablet Commonly known as: DEMADEX  Take 1 tablet (20 mg total) by mouth daily.   venlafaxine  XR 150 MG 24 hr capsule Commonly known as: EFFEXOR -XR Take 150 mg by mouth daily.   Vitamin D  (Ergocalciferol ) 50000 units Caps Take 1 capsule by mouth once a week.        Allergies:   Allergies  Allergen Reactions   Codeine Nausea And Vomiting and Other (See Comments)   Demerol [Meperidine] Nausea And Vomiting    Family History: Family History  Problem Relation Age of Onset   COPD Father    Cancer Father    Breast cancer Cousin    Breast cancer Maternal Aunt    Breast cancer Maternal Aunt     Social History:   reports that she has never smoked. She has never been exposed to tobacco smoke. She has never used smokeless tobacco. No history on file for alcohol  use and drug use.  Physical Exam: BP 105/65 (BP Location: Left Arm, Patient Position: Sitting, Cuff Size: Normal)   Pulse 69   Ht 5' 5 (1.651 m)   Wt 132 lb (59.9 kg)   BMI 21.97 kg/m   Constitutional:  Alert and oriented, no acute distress, nontoxic appearing HEENT: [place], AT Cardiovascular: No clubbing, cyanosis, or edema Respiratory: Normal respiratory effort, no increased work of breathing Skin: No rashes, bruises or suspicious lesions Neurologic: Grossly intact, no focal deficits, moving all 4 extremities Psychiatric: Normal mood and affect  Laboratory Data: Results for orders placed or performed in visit on 11/26/23  Microscopic Examination   Collection Time: 11/26/23  1:58 PM   Urine  Result Value Ref Range   WBC, UA >30 (A) 0 - 5 /hpf   RBC, Urine 3-10 (A) 0 - 2 /hpf   Epithelial Cells (non renal) 0-10 0 - 10 /hpf   Bacteria, UA Moderate (A) None seen/Few  Urinalysis, Complete   Collection Time: 11/26/23  1:58 PM  Result Value Ref Range   Specific Gravity, UA 1.025 1.005 - 1.030   pH, UA 6.0 5.0 - 7.5   Color, UA Yellow Yellow   Appearance Ur Slightly cloudy Clear   Leukocytes,UA Negative Negative   Protein,UA 2+ (A) Negative/Trace   Glucose, UA 3+ (A) Negative   Ketones, UA Negative Negative   RBC, UA 1+ (A) Negative   Bilirubin, UA Negative Negative   Urobilinogen, Ur 0.2 0.2 - 1.0 mg/dL   Nitrite, UA Positive (A) Negative   Microscopic Examination See below:    Assessment &  Plan:   1. Recurrent UTI (Primary) UA appears grossly infected, will start empiric amoxicillin  and send for culture for further evaluation.  Will treat her for 7 days in light of her known incomplete emptying.  Her recurrent UTIs are multifactorial including incomplete emptying and glucosuria as below.  Since she is hesitant to perform CIC or have a chronic Foley, will start low-dose suppressive antibiotics based on culture results. - Urinalysis, Complete - CULTURE, URINE COMPREHENSIVE - amoxicillin  (AMOXIL ) 875 MG tablet;  Take 1 tablet (875 mg total) by mouth every 12 (twelve) hours.  Dispense: 14 tablet; Refill: 0  2. Glucosuria Secondary to Farxiga, which it appears she was started on only last month.  I am concerned that she has had a yeast infection and bladder infection since starting this medication, and we had a very frank conversation that her incomplete bladder emptying combined with glucosuria is going to make her very prone to GU infections.  Will continue to monitor, though if she continues to have frequent infections despite suppressive antibiotics, we may need to be more aggressive in managing her incomplete emptying or discuss Doreen alternatives with cardiology.  Return for Will call to start suppressive abx per cx results.  Lucie Hones, PA-C  [facility] [place] [address], [address] [place], [place] [address] [phone]

## 2023-12-01 ENCOUNTER — Other Ambulatory Visit: Payer: Self-pay

## 2023-12-01 ENCOUNTER — Ambulatory Visit
Admission: EM | Admit: 2023-12-01 | Discharge: 2023-12-01 | Disposition: A | Discharge status: Home / Self Care | Attending: Family Medicine | Admitting: Family Medicine

## 2023-12-01 DIAGNOSIS — B379 Candidiasis, unspecified: Secondary | ICD-10-CM | POA: Insufficient documentation

## 2023-12-01 DIAGNOSIS — R300 Dysuria: Secondary | ICD-10-CM | POA: Insufficient documentation

## 2023-12-01 LAB — URINALYSIS, W/ REFLEX TO CULTURE (INFECTION SUSPECTED)
Bilirubin Urine: NEGATIVE
Glucose, UA: >=500 mg/dL — AB
Ketones, ur: NEGATIVE mg/dL
Leukocytes,Ua: NEGATIVE
Nitrite: NEGATIVE
Protein, ur: 100 mg/dL — AB
Specific Gravity, Urine: 1.025 (ref 1.005–1.030)
WBC, UA: >50 WBC/hpf (ref 0–5)
pH: 5.5 (ref 5.0–8.0)

## 2023-12-01 MED ORDER — TRAMADOL HCL 50 MG PO TABS
50.0000 mg | ORAL_TABLET | Freq: Two times a day (BID) | ORAL | 0 refills | Status: AC | PRN
Start: 2023-12-01 — End: ?

## 2023-12-01 MED ORDER — FLUCONAZOLE 150 MG PO TABS: 150.0000 mg | ORAL_TABLET | ORAL | 0 refills | Status: DC

## 2023-12-01 NOTE — Discharge Instructions (Signed)
 Stop by the pharmacy to pick up your medication.   You did not have evidence of a urinary tract infection. I sent your urine for culture to be sure. Someone may call you to stop/change antibiotics based off your culture results.  Follow up with your primary care provider or return to the urgent care, if not improving.

## 2023-12-01 NOTE — ED Provider Notes (Signed)
 [facility]    CSN: [id number] Arrival date & time: 12/01/23  1337      History   Chief Complaint Chief Complaint  Patient presents with   UTI     HPI HPI [name] is a 78 y.o. female.   History provided by patient and her husband.  [name] presents for vaginal itching and dysuria with urinary frequency for the past 4 days.  Tried AZO prior to arrival.  Has had any antibiotics in last 30 days that were prescribed by her urologist, per husband.  No LMP recorded. Patient has had a hysterectomy.          Past Medical History:  Diagnosis Date   Collagen vascular disease (HCC)    Depression    Hyperlipidemia    Muscle pain    Neuropathy    RA (rheumatoid arthritis) (HCC)    Thyroid  disease    Vitamin B 12 deficiency     Patient Active Problem List   Diagnosis Date Noted   Acute hypoxemic respiratory failure (HCC) 01/27/2023   Meningioma (HCC) 09/15/2020   Chronic diastolic CHF (congestive heart failure) (HCC) 09/15/2020   Memory difficulty 08/31/2020   History of stroke 08/31/2020   Falls frequently 08/31/2020   Ataxic dysarthria 08/31/2020   Acute CHF (congestive heart failure) (HCC) 07/15/2020   Hypertension    Anterior epistaxis    Senile purpura (HCC) 06/15/2020   Ganglion of joint 06/12/2020   Hyperlipidemia 06/12/2020   Hypothyroidism due to acquired atrophy of thyroid  06/12/2020   Recurrent major depressive disorder, in partial remission (HCC) 06/12/2020   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/12/2020   Sleep apnea 06/12/2020   Elevated troponin 02/06/2020   Chronic respiratory failure with hypoxia (HCC) 12/22/2018   Immunosuppression (HCC) 12/22/2018   Pulmonary fibrosis (HCC) 12/21/2018   Bilateral carotid artery disease (HCC) 07/31/2016   Acute CVA (cerebrovascular accident) (HCC) 07/17/2016   Other abnormal glucose 11/25/2013   Prediabetes 11/25/2013   Rheumatoid arthritis with rheumatoid factor (HCC) 09/20/2013    Past  Surgical History:  Procedure Laterality Date   ABDOMINAL HYSTERECTOMY     BREAST BIOPSY Left    2 benign biopsies   ECTOPIC PREGNANCY SURGERY     KNEE SURGERY Right    LAMINECTOMY     leg vein stripping Bilateral    TONSILLECTOMY      OB History   No obstetric history on file.      Home Medications    Prior to Admission medications   Medication Sig Start Date End Date Taking? Authorizing Provider  fluconazole  (DIFLUCAN ) 150 MG tablet Take 1 tablet (150 mg total) by mouth every 3 (three) days for 2 doses. 12/01/23 12/05/23 Yes Elecia Serafin, DO  Adalimumab 40 MG/0.8ML PSKT Inject 40 mg into the skin every 14 (fourteen) days.     [provider]  albuterol  (VENTOLIN  HFA) 108 (90 Base) MCG/ACT inhaler Inhale 1-2 puffs into the lungs every 6 (six) hours as needed for wheezing or shortness of breath.    [provider]  amoxicillin  (AMOXIL ) 875 MG tablet Take 1 tablet (875 mg total) by mouth every 12 (twelve) hours. 11/26/23   Vaillancourt, Samantha, PA-C  aspirin  EC 81 MG tablet Take 81 mg by mouth daily.     [provider]  atorvastatin  (LIPITOR) 40 MG tablet Take 1 tablet (40 mg total) by mouth daily at 6 PM. 07/19/16   Sainani, Vivek J, MD  clopidogrel  (PLAVIX ) 75 MG tablet Take 1  tablet (75 mg total) by mouth daily. 07/19/16   Sainani, Vivek J, MD  folic acid  (FOLVITE ) 1 MG tablet Take 1 mg by mouth daily.    [provider]  ipratropium-albuterol  (DUONEB) 0.5-2.5 (3) MG/3ML SOLN Inhale 3 mLs into the lungs 2 (two) times daily as needed (wheezing or shortness of breath).    [provider]  lamoTRIgine  (LAMICTAL ) 100 MG tablet Take 100 mg by mouth daily.    [provider]  levothyroxine  (SYNTHROID ) 112 MCG tablet Take 1 tablet (112 mcg total) by mouth daily. 07/17/20   Amin, Sumayya, MD  methotrexate  (RHEUMATREX) 2.5 MG tablet Take 20 mg by mouth every Saturday.    [provider]  mirtazapine  (REMERON ) 15 MG tablet Take 15  mg by mouth at bedtime.    [provider]  nystatin cream (MYCOSTATIN) Apply 1 Application topically 2 (two) times daily. 02/19/22   [provider]  pantoprazole  (PROTONIX ) 40 MG tablet Take 40 mg by mouth.    [provider]  potassium chloride  (KLOR-CON ) 8 MEQ tablet Take 8 mEq by mouth daily.    [provider]  predniSONE (DELTASONE) 5 MG tablet Take 2.5 mg by mouth daily.    [provider]  torsemide  (DEMADEX ) 20 MG tablet Take 1 tablet (20 mg total) by mouth daily. 02/05/23   Von Bellis, MD  venlafaxine  XR (EFFEXOR -XR) 150 MG 24 hr capsule Take 150 mg by mouth daily.    [provider]  Vitamin D , Ergocalciferol , 50000 units CAPS Take 1 capsule by mouth once a week. 04/26/22   [provider]    Family History Family History  Problem Relation Age of Onset   COPD Father    Cancer Father    Breast cancer Cousin    Breast cancer Maternal Aunt    Breast cancer Maternal Aunt     Social History Social History   Tobacco Use   Smoking status: Never    Passive exposure: Never   Smokeless tobacco: Never     Allergies   Codeine and Demerol [meperidine]   Review of Systems Review of Systems: :negative unless otherwise stated in HPI.      Physical Exam Triage Vital Signs ED Triage Vitals  Encounter Vitals Group     BP      Girls Systolic BP Percentile      Girls Diastolic BP Percentile      Boys Systolic BP Percentile      Boys Diastolic BP Percentile      Pulse      Resp      Temp      Temp src      SpO2      Weight      Height      Head Circumference      Peak Flow      Pain Score      Pain Loc      Pain Education      Exclude from Growth Chart    No data found.  Updated Vital Signs BP (!) 146/62 (BP Location: Right Arm) Comment: Notified provider  Pulse 75   Temp 98.1 F (36.7 C) (Oral)   Resp 15   SpO2 98%   Visual Acuity Right Eye Distance:   Left Eye Distance:   Bilateral  Distance:    Right Eye Near:   Left Eye Near:    Bilateral Near:     Physical Exam GEN: well appearing female in no  acute distress  CVS: well perfused  RESP: speaking in full sentences without pause    UC Treatments / Results  Labs (all labs ordered are listed, but only abnormal results are displayed) Labs Reviewed  URINALYSIS, W/ REFLEX TO CULTURE (INFECTION SUSPECTED) - Abnormal; Notable for the following components:      Result Value   APPearance HAZY (*)    Glucose, UA >=500 (*)    Hgb urine dipstick TRACE (*)    Protein, ur 100 (*)    Bacteria, UA FEW (*)    All other components within normal limits  URINE CULTURE    EKG   Radiology No results found.  Procedures Procedures (including critical care time)  Medications Ordered in UC Medications - No data to display  Initial Impression / Assessment and Plan / UC Course  I have reviewed the triage vital signs and the nursing notes.  Pertinent labs & imaging results that were available during my care of the patient were reviewed by me and considered in my medical decision making (see chart for details).      Acute cystitis:  Patient is a 78 y.o. female  who presents for 4 days of dysuria and urinary frequency.  Overall patient is well-appearing and afebrile.  Vital signs stable.  Urinalysis not consistent with acute cystitis.  Hematuria not supported on microscopy.  She does have some glucosuria and proteinuria.  She has uric acid cystals and has history of bladder stones. She had yeast too. Treat with Diflucan  for 2 doses. Urine culture obtained.  Follow-up sensitivities and change antibiotics, if needed.   Return precautions including abdominal pain, fever, chills, nausea, or vomiting given. Follow-up,  if symptoms not improving or getting worse. Discussed MDM, treatment plan and plan for follow-up with patient who agrees with plan.        Final Clinical Impressions(s) / UC Diagnoses   Final diagnoses:   Yeast infection  Dysuria   Discharge Instructions   None    ED Prescriptions     Medication Sig Dispense Auth. Provider   fluconazole  (DIFLUCAN ) 150 MG tablet Take 1 tablet (150 mg total) by mouth every 3 (three) days for 2 doses. 2 tablet Kriste Berth, DO      I have reviewed the PDMP during this encounter.   Jaiveer Panas, DO 12/01/23 1529

## 2023-12-01 NOTE — ED Triage Notes (Addendum)
 Pt is here with burning and pressure, frequency with itching to urinate that started 4 days ago, pt has taken AZO to relieve discomfort.

## 2023-12-02 ENCOUNTER — Telehealth: Payer: Self-pay

## 2023-12-03 ENCOUNTER — Ambulatory Visit (HOSPITAL_COMMUNITY): Payer: Self-pay

## 2023-12-03 DIAGNOSIS — Z8739 Personal history of other diseases of the musculoskeletal system and connective tissue: Secondary | ICD-10-CM | POA: Diagnosis not present

## 2023-12-03 DIAGNOSIS — L84 Corns and callosities: Secondary | ICD-10-CM | POA: Diagnosis not present

## 2023-12-03 LAB — CULTURE, URINE COMPREHENSIVE

## 2023-12-03 LAB — URINE CULTURE: Culture: 20000 — AB

## 2023-12-03 MED ORDER — FLUCONAZOLE 200 MG PO TABS: 200.0000 mg | ORAL_TABLET | Freq: Every day | ORAL | 0 refills | Status: AC

## 2023-12-03 NOTE — Telephone Encounter (Signed)
 Pt with candidal cystitis. Treat with Diflucan  200 mg daily for 2 weeks.   Caprice Porteous, DO

## 2023-12-03 NOTE — Telephone Encounter (Signed)
 Pt is being adequately treated for a yeast infection by urgent care.

## 2023-12-04 DIAGNOSIS — J432 Centrilobular emphysema: Secondary | ICD-10-CM | POA: Diagnosis not present

## 2023-12-04 DIAGNOSIS — M0510 Rheumatoid lung disease with rheumatoid arthritis of unspecified site: Secondary | ICD-10-CM | POA: Diagnosis not present

## 2023-12-04 DIAGNOSIS — J8410 Pulmonary fibrosis, unspecified: Secondary | ICD-10-CM | POA: Diagnosis not present

## 2023-12-05 ENCOUNTER — Ambulatory Visit: Payer: Self-pay | Admitting: Physician Assistant

## 2023-12-08 ENCOUNTER — Other Ambulatory Visit: Payer: Self-pay

## 2023-12-08 DIAGNOSIS — N390 Urinary tract infection, site not specified: Secondary | ICD-10-CM

## 2023-12-08 MED ORDER — METHENAMINE HIPPURATE 1 G PO TABS: 1.0000 g | ORAL_TABLET | Freq: Two times a day (BID) | ORAL | 3 refills | Status: AC

## 2023-12-08 NOTE — Progress Notes (Signed)
 Spoke with Pts Husband and let him know that we would be putting pt on Hiprex  1g twice a day to try and manage her recurrent UTIs. He stated understanding.

## 2023-12-10 DIAGNOSIS — E441 Mild protein-calorie malnutrition: Secondary | ICD-10-CM | POA: Diagnosis not present

## 2023-12-10 DIAGNOSIS — R471 Dysarthria and anarthria: Secondary | ICD-10-CM | POA: Diagnosis not present

## 2023-12-10 DIAGNOSIS — F3341 Major depressive disorder, recurrent, in partial remission: Secondary | ICD-10-CM | POA: Diagnosis not present

## 2023-12-10 DIAGNOSIS — J9611 Chronic respiratory failure with hypoxia: Secondary | ICD-10-CM | POA: Diagnosis not present

## 2023-12-10 DIAGNOSIS — I779 Disorder of arteries and arterioles, unspecified: Secondary | ICD-10-CM | POA: Diagnosis not present

## 2023-12-10 DIAGNOSIS — J8410 Pulmonary fibrosis, unspecified: Secondary | ICD-10-CM | POA: Diagnosis not present

## 2023-12-10 DIAGNOSIS — I5032 Chronic diastolic (congestive) heart failure: Secondary | ICD-10-CM | POA: Diagnosis not present

## 2023-12-10 DIAGNOSIS — D692 Other nonthrombocytopenic purpura: Secondary | ICD-10-CM | POA: Diagnosis not present

## 2023-12-10 DIAGNOSIS — M0579 Rheumatoid arthritis with rheumatoid factor of multiple sites without organ or systems involvement: Secondary | ICD-10-CM | POA: Diagnosis not present

## 2023-12-10 DIAGNOSIS — D849 Immunodeficiency, unspecified: Secondary | ICD-10-CM | POA: Diagnosis not present

## 2023-12-10 DIAGNOSIS — E7849 Other hyperlipidemia: Secondary | ICD-10-CM | POA: Diagnosis not present

## 2023-12-10 DIAGNOSIS — R7303 Prediabetes: Secondary | ICD-10-CM | POA: Diagnosis not present

## 2023-12-15 DIAGNOSIS — M21071 Valgus deformity, not elsewhere classified, right ankle: Secondary | ICD-10-CM | POA: Diagnosis not present

## 2023-12-15 DIAGNOSIS — M2042 Other hammer toe(s) (acquired), left foot: Secondary | ICD-10-CM | POA: Diagnosis not present

## 2023-12-15 DIAGNOSIS — M2041 Other hammer toe(s) (acquired), right foot: Secondary | ICD-10-CM | POA: Diagnosis not present

## 2023-12-15 DIAGNOSIS — Z8739 Personal history of other diseases of the musculoskeletal system and connective tissue: Secondary | ICD-10-CM | POA: Diagnosis not present

## 2023-12-15 DIAGNOSIS — L84 Corns and callosities: Secondary | ICD-10-CM | POA: Diagnosis not present

## 2023-12-15 DIAGNOSIS — M7742 Metatarsalgia, left foot: Secondary | ICD-10-CM | POA: Diagnosis not present

## 2023-12-15 DIAGNOSIS — G894 Chronic pain syndrome: Secondary | ICD-10-CM | POA: Diagnosis not present

## 2023-12-15 DIAGNOSIS — L909 Atrophic disorder of skin, unspecified: Secondary | ICD-10-CM | POA: Diagnosis not present

## 2023-12-15 DIAGNOSIS — M7741 Metatarsalgia, right foot: Secondary | ICD-10-CM | POA: Diagnosis not present

## 2023-12-15 DIAGNOSIS — M19071 Primary osteoarthritis, right ankle and foot: Secondary | ICD-10-CM | POA: Diagnosis not present

## 2023-12-15 DIAGNOSIS — L97511 Non-pressure chronic ulcer of other part of right foot limited to breakdown of skin: Secondary | ICD-10-CM | POA: Diagnosis not present

## 2023-12-15 DIAGNOSIS — M216X1 Other acquired deformities of right foot: Secondary | ICD-10-CM | POA: Diagnosis not present

## 2023-12-16 ENCOUNTER — Telehealth: Payer: Self-pay

## 2023-12-16 NOTE — Telephone Encounter (Signed)
 This encounter was created in error - please disregard.

## 2023-12-16 NOTE — Telephone Encounter (Signed)
 Pt LVM on the triage stating she would like to see Sam as soon as possible, states she is in pain.   LVM for pt to return call to triage symptoms.

## 2023-12-16 NOTE — Telephone Encounter (Signed)
 Returned patients called. Pt complaining of pain with urination. Pt has Hx of UTIs and yeast infections due to antibiotic use. We schedule an appt for 9/18 with a cath UA and measuring residue after. Pt voiced understanding.

## 2023-12-18 ENCOUNTER — Ambulatory Visit (INDEPENDENT_AMBULATORY_CARE_PROVIDER_SITE_OTHER): Admitting: Physician Assistant

## 2023-12-18 VITALS — BP 130/78 | HR 86 | Ht 65.5 in | Wt 132.0 lb

## 2023-12-18 DIAGNOSIS — N898 Other specified noninflammatory disorders of vagina: Secondary | ICD-10-CM

## 2023-12-18 DIAGNOSIS — N390 Urinary tract infection, site not specified: Secondary | ICD-10-CM | POA: Diagnosis not present

## 2023-12-18 LAB — URINALYSIS, COMPLETE
Bilirubin, UA: NEGATIVE
Ketones, UA: NEGATIVE
Nitrite, UA: POSITIVE — AB
Specific Gravity, UA: 1.03 (ref 1.005–1.030)
Urobilinogen, Ur: 0.2 mg/dL (ref 0.2–1.0)
pH, UA: 6 (ref 5.0–7.5)

## 2023-12-18 LAB — MICROSCOPIC EXAMINATION
RBC, Urine: >30 /HPF — AB (ref 0–2)
WBC, UA: >30 /HPF — AB (ref 0–5)

## 2023-12-18 MED ORDER — FLUCONAZOLE 150 MG PO TABS: 150.0000 mg | ORAL_TABLET | Freq: Once | ORAL | 0 refills | Status: AC

## 2023-12-18 MED ORDER — AMOXICILLIN 875 MG PO TABS: 875.0000 mg | ORAL_TABLET | Freq: Two times a day (BID) | ORAL | 0 refills | Status: DC

## 2023-12-18 NOTE — Progress Notes (Signed)
 12/18/2023 2:17 PM   [name] 10/01/1945 [id number]  CC: Chief Complaint  Patient presents with   Recurrent UTI   HPI: [name] is a 78 y.o. female with PMH incomplete bladder emptying, recurrent UTI on Hiprex , prediabetes, pulmonary fibrosis, OSA, and diastolic CHF on Farxiga who presents today for evaluation of recurrent UTI.  She is accompanied today by her husband, Vicenta.  She has had a history of recurrent UTIs, however she has developed more frequent bladder and yeast infections since starting Farxiga about 2 months ago.   Today she reports her dysuria is unchanged since I saw her in clinic last month.  She has been having nausea, but no fever.  She started Hiprex  about 9 days ago.  Notably, she reports passing gas from her vagina at night for at least the past month.  She has not noticed fecaluria or pneumaturia.  Recent culture results as follows: 11/26/2023: Tetracycline resistant E faecalis 12/01/2023: Yeast  In-office catheterized UA today positive for 3+ glucose, 3+ protein, 3+ blood, nitrites, and 1+ leukocytes; urine microscopy with >30 WBCs/HPF, >30 RBCs/HPF, and many bacteria.  Measured residual 65 mL.  PMH: Past Medical History:  Diagnosis Date   Collagen vascular disease (HCC)    Depression    Hyperlipidemia    Muscle pain    Neuropathy    RA (rheumatoid arthritis) (HCC)    Thyroid  disease    Vitamin B 12 deficiency     Surgical History: Past Surgical History:  Procedure Laterality Date   ABDOMINAL HYSTERECTOMY     BREAST BIOPSY Left    2 benign biopsies   ECTOPIC PREGNANCY SURGERY     KNEE SURGERY Right    LAMINECTOMY     leg vein stripping Bilateral    TONSILLECTOMY      Home Medications:  Allergies as of 12/18/2023       Reactions   Codeine Nausea And Vomiting, Other (See Comments)   Demerol [meperidine] Nausea And Vomiting        Medication List        Accurate as of December 18, 2023  2:17 PM. If you have any  questions, ask your nurse or doctor.          adalimumab 40 MG/0.8ML prefilled syringe Commonly known as: HUMIRA Inject 40 mg into the skin every 14 (fourteen) days.   albuterol  108 (90 Base) MCG/ACT inhaler Commonly known as: VENTOLIN  HFA Inhale 1-2 puffs into the lungs every 6 (six) hours as needed for wheezing or shortness of breath.   amoxicillin  875 MG tablet Commonly known as: AMOXIL  Take 1 tablet (875 mg total) by mouth every 12 (twelve) hours.   aspirin  EC 81 MG tablet Take 81 mg by mouth daily.   atorvastatin  40 MG tablet Commonly known as: LIPITOR Take 1 tablet (40 mg total) by mouth daily at 6 PM.   clopidogrel  75 MG tablet Commonly known as: PLAVIX  Take 1 tablet (75 mg total) by mouth daily.   folic acid  1 MG tablet Commonly known as: FOLVITE  Take 1 mg by mouth daily.   ipratropium-albuterol  0.5-2.5 (3) MG/3ML Soln Commonly known as: DUONEB Inhale 3 mLs into the lungs 2 (two) times daily as needed (wheezing or shortness of breath).   lamoTRIgine  100 MG tablet Commonly known as: LAMICTAL  Take 100 mg by mouth daily.   levothyroxine  112 MCG tablet Commonly known as: SYNTHROID  Take 1 tablet (112 mcg total) by mouth daily.   methenamine  1 g tablet Commonly  known as: Hiprex  Take 1 tablet (1 g total) by mouth 2 (two) times daily with a meal.   methotrexate  2.5 MG tablet Commonly known as: RHEUMATREX Take 20 mg by mouth every Saturday.   mirtazapine  15 MG tablet Commonly known as: REMERON  Take 15 mg by mouth at bedtime.   nystatin cream Commonly known as: MYCOSTATIN Apply 1 Application topically 2 (two) times daily.   pantoprazole  40 MG tablet Commonly known as: PROTONIX  Take 40 mg by mouth.   potassium chloride  8 MEQ tablet Commonly known as: KLOR-CON  Take 8 mEq by mouth daily.   predniSONE 5 MG tablet Commonly known as: DELTASONE Take 2.5 mg by mouth daily.   torsemide  20 MG tablet Commonly known as: DEMADEX  Take 1 tablet (20 mg total)  by mouth daily.   traMADol  50 MG tablet Commonly known as: ULTRAM  Take 1 tablet (50 mg total) by mouth every 12 (twelve) hours as needed.   venlafaxine  XR 150 MG 24 hr capsule Commonly known as: EFFEXOR -XR Take 150 mg by mouth daily.   Vitamin D  (Ergocalciferol ) 50000 units Caps Take 1 capsule by mouth once a week.        Allergies:  Allergies  Allergen Reactions   Codeine Nausea And Vomiting and Other (See Comments)   Demerol [Meperidine] Nausea And Vomiting    Family History: Family History  Problem Relation Age of Onset   COPD Father    Cancer Father    Breast cancer Cousin    Breast cancer Maternal Aunt    Breast cancer Maternal Aunt     Social History:   reports that she has never smoked. She has never been exposed to tobacco smoke. She has never used smokeless tobacco. No history on file for alcohol  use and drug use.  Physical Exam: BP 130/78 (BP Location: Left Arm, Patient Position: Sitting, Cuff Size: Normal)   Pulse 86   Ht 5' 5.5 (1.664 m)   Wt 132 lb (59.9 kg)   SpO2 92%   BMI 21.63 kg/m   Constitutional:  Alert and oriented, no acute distress, nontoxic appearing HEENT: [place], AT Cardiovascular: No clubbing, cyanosis, or edema Respiratory: Normal respiratory effort, no increased work of breathing GU: Urethral caruncle.  No cystocele.  No visualized stool in the vagina. Skin: No rashes, bruises or suspicious lesions Neurologic: Grossly intact, no focal deficits, moving all 4 extremities Psychiatric: Normal mood and affect  Laboratory Data: Results for orders placed or performed in visit on 12/18/23  Microscopic Examination   Collection Time: 12/18/23  1:44 PM   Urine  Result Value Ref Range   WBC, UA >30 (A) 0 - 5 /hpf   RBC, Urine >30 (A) 0 - 2 /hpf   Epithelial Cells (non renal) 0-10 0 - 10 /hpf   Bacteria, UA Many (A) None seen/Few  Urinalysis, Complete   Collection Time: 12/18/23  1:44 PM  Result Value Ref Range   Specific Gravity, UA  1.030 1.005 - 1.030   pH, UA 6.0 5.0 - 7.5   Color, UA Yellow Yellow   Appearance Ur Cloudy (A) Clear   Leukocytes,UA 1+ (A) Negative   Protein,UA 3+ (A) Negative/Trace   Glucose, UA 3+ (A) Negative   Ketones, UA Negative Negative   RBC, UA 3+ (A) Negative   Bilirubin, UA Negative Negative   Urobilinogen, Ur 0.2 0.2 - 1.0 mg/dL   Nitrite, UA Positive (A) Negative   Microscopic Examination See below:    Assessment & Plan:   1. Recurrent  UTI (Primary) Cath UA again appears grossly infected, will start empiric amoxicillin  and send for culture for further evaluation.  I am also sending in Diflucan  empirically given her recent issues with yeast infections.  Her residual is reasonable today.  We discussed again that I think her recurrent UTIs are multifactorial in the setting of incomplete emptying, Farxiga, possible bladder stones given her history, and questionable fistula, see below. - Urinalysis, Complete - CULTURE, URINE COMPREHENSIVE - amoxicillin  (AMOXIL ) 875 MG tablet; Take 1 tablet (875 mg total) by mouth every 12 (twelve) hours.  Dispense: 14 tablet; Refill: 0 - fluconazole  (DIFLUCAN ) 150 MG tablet; Take 1 tablet (150 mg total) by mouth once for 1 dose.  Dispense: 1 tablet; Refill: 0  2. Vaginal flatus With reports of passing flatus per vagina, I question a possible vesicovaginal fistula, though I did not see evidence of this on pelvic exam today.  Will obtain CTAP with oral or rectal contrast for further evaluation and contact her with results. - CT ABDOMEN PELVIS WO CONTRAST; Future  Return for Will call with results.  Lucie Hones, PA-C  [facility] [place] [address], [address] [place], [place] [address] [phone]

## 2023-12-18 NOTE — Progress Notes (Signed)
 In and Out Catheterization  Patient is present today for a I & O catheterization due to provider needed cath urinalysis. Patient was cleaned and prepped in a sterile fashion with betadine . A 14FR cath was inserted no complications were noted , 65ml of urine return was noted, urine was cloudy yellow in color. A clean urine sample was collected for Urinalysis. Bladder was drained  And catheter was removed with out difficulty.    Performed by: Beauford Browner, CCMA

## 2023-12-23 ENCOUNTER — Ambulatory Visit: Payer: Self-pay | Admitting: Urology

## 2023-12-23 ENCOUNTER — Ambulatory Visit
Admission: RE | Admit: 2023-12-23 | Discharge: 2023-12-23 | Disposition: A | Discharge status: Home / Self Care | Source: Ambulatory Visit | Attending: Physician Assistant | Admitting: Physician Assistant

## 2023-12-23 DIAGNOSIS — N281 Cyst of kidney, acquired: Secondary | ICD-10-CM | POA: Diagnosis not present

## 2023-12-23 DIAGNOSIS — K8020 Calculus of gallbladder without cholecystitis without obstruction: Secondary | ICD-10-CM | POA: Insufficient documentation

## 2023-12-23 DIAGNOSIS — I2510 Atherosclerotic heart disease of native coronary artery without angina pectoris: Secondary | ICD-10-CM | POA: Insufficient documentation

## 2023-12-23 DIAGNOSIS — K5730 Diverticulosis of large intestine without perforation or abscess without bleeding: Secondary | ICD-10-CM | POA: Diagnosis not present

## 2023-12-23 DIAGNOSIS — I700 Atherosclerosis of aorta: Secondary | ICD-10-CM | POA: Insufficient documentation

## 2023-12-23 DIAGNOSIS — N321 Vesicointestinal fistula: Secondary | ICD-10-CM | POA: Insufficient documentation

## 2023-12-23 DIAGNOSIS — N898 Other specified noninflammatory disorders of vagina: Secondary | ICD-10-CM | POA: Insufficient documentation

## 2023-12-23 LAB — CULTURE, URINE COMPREHENSIVE

## 2023-12-23 NOTE — Progress Notes (Signed)
 Called patients husband and no answer left a message to call back

## 2023-12-24 ENCOUNTER — Telehealth: Payer: Self-pay

## 2023-12-24 NOTE — Telephone Encounter (Signed)
 Called pt's husband, he states he was not home with her at the moment but will tell her to call us  when he gets home.

## 2023-12-24 NOTE — Telephone Encounter (Signed)
 2nd attempt to reach pt, unable to LVM

## 2023-12-24 NOTE — Telephone Encounter (Signed)
 Pt called the triage line stating she was not feeling well, she is on antibiotics but they are not helping with symptoms.   1st attempt to reach pt via phone call, unable to LVM.

## 2023-12-25 ENCOUNTER — Telehealth: Payer: Self-pay

## 2023-12-25 NOTE — Telephone Encounter (Signed)
 Pt called with c/o vaginal pain. Pt is currently on antibiotics for a UTI and has a history of yeast infections recently more than likely due to antibiotic use. Patient stated she had not use a topical treatment yet for yeast. I instructed her to try this to see if it gave her any relief. Pt was agreeable and will call us  back if she has any other issues.

## 2023-12-26 ENCOUNTER — Telehealth: Payer: Self-pay

## 2023-12-26 NOTE — Telephone Encounter (Signed)
 Tasha with [facility] called triage line stating she saw pt today for wound care and the pt was c/o pain with urination that is a level 10.  Pt saw sam on 9/18. Given ATB for uti and diflucan  for y/I.   Per Lorenza pt has not completed her ATB. Advised Tasha to have pt completed ATB. Confirm pt takes diflucan . Push fluids.   SV will call pt with CT results ASAP.   Lorenza states she will let pt know and make her PCP aware.

## 2023-12-29 ENCOUNTER — Telehealth: Payer: Self-pay

## 2023-12-29 NOTE — Telephone Encounter (Signed)
 Pt calls triage line and states that she is calling for the results of her CT scan as she is still having abdominal pain. Patient has started antibiotics as prescribed. She states that she is having small BMs. Advised pt that she may be constipated, patients states she just needs answers. Please advise.

## 2023-12-29 NOTE — Telephone Encounter (Signed)
 No significant findings on CT to account for her dysuria, and no evidence of fistula (though it does not appear contrast was administered PO or rectally as requested). At this point I would recommend cystoscopy with Dr. Francisca for recurrent vs persistent UTI.

## 2023-12-30 DIAGNOSIS — J9611 Chronic respiratory failure with hypoxia: Secondary | ICD-10-CM | POA: Diagnosis not present

## 2023-12-30 DIAGNOSIS — J8410 Pulmonary fibrosis, unspecified: Secondary | ICD-10-CM | POA: Diagnosis not present

## 2023-12-30 DIAGNOSIS — R399 Unspecified symptoms and signs involving the genitourinary system: Secondary | ICD-10-CM | POA: Diagnosis not present

## 2023-12-30 DIAGNOSIS — I5032 Chronic diastolic (congestive) heart failure: Secondary | ICD-10-CM | POA: Diagnosis not present

## 2023-12-30 DIAGNOSIS — Z23 Encounter for immunization: Secondary | ICD-10-CM | POA: Diagnosis not present

## 2023-12-30 NOTE — Telephone Encounter (Signed)
 Spoke with patient and informed her of no significant findings on CT. Also, let patient know sam suggests to have a Cystoscopy done with Dr. Francisca. Procedure and possible risks explained. Patient scheduled for 10/14 at 2:30 pm. Patient accepted appointment and voiced understanding.

## 2024-01-06 ENCOUNTER — Telehealth: Payer: Self-pay

## 2024-01-06 DIAGNOSIS — N390 Urinary tract infection, site not specified: Secondary | ICD-10-CM

## 2024-01-06 MED ORDER — PHENAZOPYRIDINE HCL 200 MG PO TABS: 200.0000 mg | ORAL_TABLET | Freq: Three times a day (TID) | ORAL | 0 refills | Status: DC | PRN

## 2024-01-06 NOTE — Telephone Encounter (Signed)
 Pt called in with c/o pain with urination. We are still awaiting a cysto to be done to see if there is another cause. Pt is about to finish a round of antibiotic today for a UTI. We are calling in Pyridium to see if this helps with her pain.

## 2024-01-07 ENCOUNTER — Telehealth: Payer: Self-pay

## 2024-01-07 DIAGNOSIS — N390 Urinary tract infection, site not specified: Secondary | ICD-10-CM

## 2024-01-07 MED ORDER — NITROFURANTOIN MONOHYD MACRO 100 MG PO CAPS: 100.0000 mg | ORAL_CAPSULE | Freq: Two times a day (BID) | ORAL | 0 refills | Status: DC

## 2024-01-07 NOTE — Telephone Encounter (Signed)
 Pt called after hours triage line. Pt husband answered phone and let him know that that we were calling in antibiotics for his wife

## 2024-01-12 DIAGNOSIS — J479 Bronchiectasis, uncomplicated: Secondary | ICD-10-CM | POA: Diagnosis not present

## 2024-01-12 DIAGNOSIS — I69354 Hemiplegia and hemiparesis following cerebral infarction affecting left non-dominant side: Secondary | ICD-10-CM | POA: Diagnosis not present

## 2024-01-12 DIAGNOSIS — I69322 Dysarthria following cerebral infarction: Secondary | ICD-10-CM | POA: Diagnosis not present

## 2024-01-12 DIAGNOSIS — I69393 Ataxia following cerebral infarction: Secondary | ICD-10-CM | POA: Diagnosis not present

## 2024-01-12 DIAGNOSIS — J9611 Chronic respiratory failure with hypoxia: Secondary | ICD-10-CM | POA: Diagnosis not present

## 2024-01-12 DIAGNOSIS — G20B1 Parkinson's disease with dyskinesia, without mention of fluctuations: Secondary | ICD-10-CM | POA: Diagnosis not present

## 2024-01-12 DIAGNOSIS — I69351 Hemiplegia and hemiparesis following cerebral infarction affecting right dominant side: Secondary | ICD-10-CM | POA: Diagnosis not present

## 2024-01-13 ENCOUNTER — Ambulatory Visit (INDEPENDENT_AMBULATORY_CARE_PROVIDER_SITE_OTHER): Admitting: Urology

## 2024-01-13 VITALS — BP 147/84 | HR 78 | Wt 120.0 lb

## 2024-01-13 DIAGNOSIS — N390 Urinary tract infection, site not specified: Secondary | ICD-10-CM | POA: Diagnosis not present

## 2024-01-13 MED ORDER — LIDOCAINE HCL URETHRAL/MUCOSAL 2 % EX GEL
1.0000 | Freq: Once | CUTANEOUS | Status: AC
  Administered 2024-01-13: 1 via URETHRAL

## 2024-01-13 MED ORDER — SULFAMETHOXAZOLE-TRIMETHOPRIM 800-160 MG PO TABS
1.0000 | ORAL_TABLET | Freq: Once | ORAL | Status: AC
  Administered 2024-01-13: 1 via ORAL

## 2024-01-13 NOTE — Progress Notes (Signed)
 Cystoscopy Procedure Note:  Indication: Recurrent UTI  Bactrim  given for prophylaxis  After informed consent and discussion of the procedure and its risks, [name] was positioned and prepped in the standard fashion. Cystoscopy was performed with a flexible cystoscope. The urethra, bladder neck and entire bladder was visualized in a standard fashion.  The ureteral orifices were visualized in their normal location and orientation.  Debris limited vision and there was some mild erythema throughout the bladder, may be related to recent infection.  No tumors, stones, or other lesions.  Cytology was sent.  Imaging: CT benign  Findings: Normal cystoscopy, mild erythema likely related to recent UTI, cytology sent  Assessment and Plan: Follow-up cytology Recommend continuing maximal prevention strategies with topical estrogen cream, cranberry tablets, HipRex , consider low-dose prophylaxis  Redell Burnet, MD 01/13/2024

## 2024-01-13 NOTE — Addendum Note (Signed)
 Addended by: ELOUISE SANTA BROCKS on: 01/13/2024 02:55 PM   Modules accepted: Orders

## 2024-01-13 NOTE — Addendum Note (Signed)
 Addended by: Cosima Prentiss A on: 01/13/2024 02:54 PM   Modules accepted: Orders

## 2024-01-20 ENCOUNTER — Telehealth: Payer: Self-pay | Admitting: Urology

## 2024-01-20 LAB — CYTOLOGY PLUS MONITORING PROFILE: PAP & FEULGEN

## 2024-01-20 NOTE — Telephone Encounter (Signed)
 Pt called with vaginal itching.  She says its about to drive her crazy.  She is a patient of [facility]'s.  I didn't know if you needed to call her, or if I can just schedule.  Please advise.

## 2024-01-21 ENCOUNTER — Ambulatory Visit: Payer: Self-pay | Admitting: Urology

## 2024-01-21 NOTE — Telephone Encounter (Signed)
Called pt informed her of the information below. Pt voiced understanding.  

## 2024-01-21 NOTE — Telephone Encounter (Signed)
-----   Message from Redell JAYSON Burnet sent at 01/21/2024  8:38 AM EDT ----- Metta news, urine cytology negative, no evidence of bladder cancer.  Keep follow-up as scheduled  Redell Burnet, MD 01/21/2024  ----- Message ----- From: Interface, [facility] Lab Results In Sent: 01/20/2024  12:36 PM EDT To: Redell JAYSON Burnet, MD

## 2024-01-21 NOTE — Telephone Encounter (Signed)
 Called pt, line answered and then immediately disconnected. 1st attempt.

## 2024-01-26 DIAGNOSIS — G8929 Other chronic pain: Secondary | ICD-10-CM | POA: Diagnosis not present

## 2024-01-26 DIAGNOSIS — M2041 Other hammer toe(s) (acquired), right foot: Secondary | ICD-10-CM | POA: Diagnosis not present

## 2024-01-26 DIAGNOSIS — B351 Tinea unguium: Secondary | ICD-10-CM | POA: Diagnosis not present

## 2024-01-26 DIAGNOSIS — M19071 Primary osteoarthritis, right ankle and foot: Secondary | ICD-10-CM | POA: Diagnosis not present

## 2024-01-26 DIAGNOSIS — Z8739 Personal history of other diseases of the musculoskeletal system and connective tissue: Secondary | ICD-10-CM | POA: Diagnosis not present

## 2024-01-26 DIAGNOSIS — M7741 Metatarsalgia, right foot: Secondary | ICD-10-CM | POA: Diagnosis not present

## 2024-01-26 DIAGNOSIS — L909 Atrophic disorder of skin, unspecified: Secondary | ICD-10-CM | POA: Diagnosis not present

## 2024-01-26 DIAGNOSIS — M79671 Pain in right foot: Secondary | ICD-10-CM | POA: Diagnosis not present

## 2024-01-26 DIAGNOSIS — M79672 Pain in left foot: Secondary | ICD-10-CM | POA: Diagnosis not present

## 2024-01-26 DIAGNOSIS — M216X1 Other acquired deformities of right foot: Secondary | ICD-10-CM | POA: Diagnosis not present

## 2024-01-26 DIAGNOSIS — L97511 Non-pressure chronic ulcer of other part of right foot limited to breakdown of skin: Secondary | ICD-10-CM | POA: Diagnosis not present

## 2024-01-26 DIAGNOSIS — M2011 Hallux valgus (acquired), right foot: Secondary | ICD-10-CM | POA: Diagnosis not present

## 2024-01-26 DIAGNOSIS — G894 Chronic pain syndrome: Secondary | ICD-10-CM | POA: Diagnosis not present

## 2024-02-16 DIAGNOSIS — M216X1 Other acquired deformities of right foot: Secondary | ICD-10-CM | POA: Diagnosis not present

## 2024-02-16 DIAGNOSIS — L909 Atrophic disorder of skin, unspecified: Secondary | ICD-10-CM | POA: Diagnosis not present

## 2024-02-16 DIAGNOSIS — M7741 Metatarsalgia, right foot: Secondary | ICD-10-CM | POA: Diagnosis not present

## 2024-02-16 DIAGNOSIS — M2041 Other hammer toe(s) (acquired), right foot: Secondary | ICD-10-CM | POA: Diagnosis not present

## 2024-02-16 DIAGNOSIS — M19071 Primary osteoarthritis, right ankle and foot: Secondary | ICD-10-CM | POA: Diagnosis not present

## 2024-02-16 DIAGNOSIS — G8929 Other chronic pain: Secondary | ICD-10-CM | POA: Diagnosis not present

## 2024-02-16 DIAGNOSIS — M0579 Rheumatoid arthritis with rheumatoid factor of multiple sites without organ or systems involvement: Secondary | ICD-10-CM | POA: Diagnosis not present

## 2024-02-16 DIAGNOSIS — Z8739 Personal history of other diseases of the musculoskeletal system and connective tissue: Secondary | ICD-10-CM | POA: Diagnosis not present

## 2024-02-16 DIAGNOSIS — M79671 Pain in right foot: Secondary | ICD-10-CM | POA: Diagnosis not present

## 2024-02-16 DIAGNOSIS — M21071 Valgus deformity, not elsewhere classified, right ankle: Secondary | ICD-10-CM | POA: Diagnosis not present

## 2024-02-16 DIAGNOSIS — L97521 Non-pressure chronic ulcer of other part of left foot limited to breakdown of skin: Secondary | ICD-10-CM | POA: Diagnosis not present

## 2024-02-16 DIAGNOSIS — L97511 Non-pressure chronic ulcer of other part of right foot limited to breakdown of skin: Secondary | ICD-10-CM | POA: Diagnosis not present

## 2024-02-16 DIAGNOSIS — Z79899 Other long term (current) drug therapy: Secondary | ICD-10-CM | POA: Diagnosis not present

## 2024-02-16 DIAGNOSIS — G894 Chronic pain syndrome: Secondary | ICD-10-CM | POA: Diagnosis not present

## 2024-02-17 DIAGNOSIS — J8410 Pulmonary fibrosis, unspecified: Secondary | ICD-10-CM | POA: Diagnosis not present

## 2024-02-17 DIAGNOSIS — R7303 Prediabetes: Secondary | ICD-10-CM | POA: Diagnosis not present

## 2024-02-17 DIAGNOSIS — E034 Atrophy of thyroid (acquired): Secondary | ICD-10-CM | POA: Diagnosis not present

## 2024-02-17 DIAGNOSIS — I5032 Chronic diastolic (congestive) heart failure: Secondary | ICD-10-CM | POA: Diagnosis not present

## 2024-02-17 DIAGNOSIS — M0579 Rheumatoid arthritis with rheumatoid factor of multiple sites without organ or systems involvement: Secondary | ICD-10-CM | POA: Diagnosis not present

## 2024-02-18 ENCOUNTER — Other Ambulatory Visit: Payer: Self-pay | Admitting: Physician Assistant

## 2024-02-18 DIAGNOSIS — N390 Urinary tract infection, site not specified: Secondary | ICD-10-CM

## 2024-02-24 ENCOUNTER — Other Ambulatory Visit: Payer: Self-pay | Admitting: Internal Medicine

## 2024-02-24 DIAGNOSIS — Z1231 Encounter for screening mammogram for malignant neoplasm of breast: Secondary | ICD-10-CM

## 2024-03-03 DIAGNOSIS — L97511 Non-pressure chronic ulcer of other part of right foot limited to breakdown of skin: Secondary | ICD-10-CM | POA: Diagnosis not present

## 2024-03-15 ENCOUNTER — Ambulatory Visit: Admitting: Occupational Therapy

## 2024-03-17 ENCOUNTER — Ambulatory Visit: Admitting: Occupational Therapy

## 2024-03-22 ENCOUNTER — Ambulatory Visit: Admitting: Occupational Therapy

## 2024-03-29 ENCOUNTER — Ambulatory Visit

## 2024-03-30 ENCOUNTER — Ambulatory Visit
Attending: Student in an Organized Health Care Education/Training Program | Admitting: Student in an Organized Health Care Education/Training Program

## 2024-03-30 ENCOUNTER — Encounter: Payer: Self-pay | Admitting: Student in an Organized Health Care Education/Training Program

## 2024-03-30 VITALS — BP 152/63 | HR 73 | Temp 97.3°F | Resp 14 | Ht 65.0 in | Wt 130.0 lb

## 2024-03-30 DIAGNOSIS — M2550 Pain in unspecified joint: Secondary | ICD-10-CM | POA: Diagnosis not present

## 2024-03-30 DIAGNOSIS — M069 Rheumatoid arthritis, unspecified: Secondary | ICD-10-CM | POA: Diagnosis not present

## 2024-03-30 DIAGNOSIS — G894 Chronic pain syndrome: Secondary | ICD-10-CM | POA: Insufficient documentation

## 2024-03-30 MED ORDER — GABAPENTIN 100 MG PO CAPS: ORAL_CAPSULE | ORAL | 0 refills | Status: DC

## 2024-03-30 NOTE — Progress Notes (Signed)
 +-PROVIDER NOTE: Interpretation of information contained herein should be left to medically-trained personnel. Specific patient instructions are provided elsewhere under Patient Instructions section of medical record. This document was created in part using AI and STT-dictation technology, any transcriptional errors that may result from this process are unintentional.  Patient: [name]  Service: E/M Encounter  Provider: Wallie Sherry, MD  DOB: 12/14/1945  Delivery: Face-to-face  Specialty: Interventional Pain Management  MRN: [id number]  Setting: Ambulatory outpatient facility  Specialty designation: 09  Type: New Patient  Location: Outpatient office facility  PCP: Fernande Ophelia JINNY DOUGLAS, MD  DOS: 03/30/2024    Referring Prov.: Lennie Barter, DPM   Primary Reason(s) for Visit: Encounter for initial evaluation of one or more chronic problems (new to examiner) potentially causing chronic pain, and posing a threat to normal musculoskeletal function. (Level of risk: High) CC: Foot Pain (bilateral), Hand Pain (bilateral), and Knee Pain (knee)  HPI  [name] is a 78 y.o. year old, female patient, who comes for the first time to our practice referred by Lennie Barter, DPM for our initial evaluation of her chronic pain. She has Rheumatoid arthritis with rheumatoid factor (HCC); Acute CVA (cerebrovascular accident) (HCC); Bilateral carotid artery disease; Chronic respiratory failure with hypoxia (HCC); Elevated troponin; Ganglion of joint; Hyperlipidemia; Hypothyroidism due to acquired atrophy of thyroid ; Immunosuppression; Other abnormal glucose; Pulmonary fibrosis (HCC); Recurrent major depressive disorder, in partial remission; Rheumatoid nodulosis (HCC); Sleep apnea; Acute CHF (congestive heart failure) (HCC); Hypertension; Anterior epistaxis; Senile purpura; Prediabetes; Meningioma (HCC); Memory difficulty; History of stroke; Falls frequently; Chronic diastolic CHF (congestive heart failure) (HCC); Ataxic  dysarthria; and Acute hypoxemic respiratory failure (HCC) on their problem list. Today she comes in for evaluation of her Foot Pain (bilateral), Hand Pain (bilateral), and Knee Pain (knee)  Pain Assessment: Location: Right, Left Other (Comment) Radiating:   Onset: More than a month ago Duration: Chronic pain Quality: Other (Comment) (horrible) Severity: 10-Worst pain ever/10 (subjective, self-reported pain score)  Effect on ADL:   Timing: Constant Modifying factors: ice, heat, elevation, Ibuprofen BP: (!) 152/63  HR: 73  Onset and Duration: Gradual and Present longer than 3 months Cause of pain: Arthritis Severity: Getting worse, NAS-11 at its worse: 10/10, NAS-11 at its best: 8/10, NAS-11 now: 10/10, and NAS-11 on the average: 10/10 Timing: Night, During activity or exercise, After activity or exercise, and After a period of immobility Aggravating Factors: Motion, Prolonged sitting, Prolonged standing, Stooping , Twisting, Walking, Walking uphill, and Working Alleviating Factors: Stretching, Cold packs, Lying down, Medications, Resting, and Sitting Associated Problems: Color changes, Day-time cramps, Night-time cramps, Fatigue, Inability to control bladder (urine), Inability to control bowel, Nausea, Sweating, Swelling, Weakness, Pain that wakes patient up, and Pain that does not allow patient to sleep Quality of Pain: Aching, Annoying, Burning, Cramping, Exhausting, Fearful, Hot, Itching, Nagging, Pulsating, Sharp, Stabbing, Throbbing, Toothache-like, and Uncomfortable Previous Examinations or Tests: Biopsy, Bone scan, CT scan, Ct-Myelogram, MRI scan, Myelogram, X-rays, Nerve conduction test, Neurological evaluation, Neurosurgical evaluation, Orthopedic evaluation, and Chiropractic evaluation Previous Treatments: The patient denies any previous treatments  Ms. Michalowski is being evaluated for possible interventional pain management therapies for the treatment of her chronic pain.     Discussed the use of AI scribe software for clinical note transcription with the patient, who gave verbal consent to proceed.  History of Present Illness   [name] is an 78 year old female with rheumatoid arthritis who presents with diffuse pain.  She experiences diffuse pain throughout  her body, primarily affecting her hands, knees, and feet, attributed to her rheumatoid arthritis diagnosed approximately fifteen years ago. Her toes are bent similarly to her hands. She has tried various rheumatoid medications without significant relief and has previously received steroid injections, which resulted in swelling and increased pain.  She experiences burning and tingling sensations in her hands and legs. She has not tried nerve medications like gabapentin or Lyrica before. No history of diabetes has been reported.  Her current medication regimen includes Humira, although the duration of use is unclear. She previously received infusions at her old clinic before retirement. She has not been on any nerve medications or anti-inflammatories like ibuprofen or Celebrex recently.  There is a mention of a past consultation with a neurologist who suggested the possibility of Parkinson's disease, but no follow-up was conducted. She also experiences symptoms consistent with restless leg syndrome.      Meds  Current Medications[1]   Imaging Review  C  Complexity Note: Imaging results reviewed.                         ROS  Cardiovascular: Blood thinners:  Anticoagulant Pulmonary or Respiratory: No reported pulmonary signs or symptoms such as wheezing and difficulty taking a deep full breath (Asthma), difficulty blowing air out (Emphysema), coughing up mucus (Bronchitis), persistent dry cough, or temporary stoppage of breathing during sleep Neurological: Stroke (Residual deficits or weakness:  ) Psychological-Psychiatric: Depressed Gastrointestinal: Reflux or heatburn and Alternating episodes  iof diarrhea and constipation (IBS-Irritable bowe syndrome) Genitourinary: Recurrent Urinary Tract infections Hematological: Brusing easily Endocrine: No reported endocrine signs or symptoms such as high or low blood sugar, rapid heart rate due to high thyroid  levels, obesity or weight gain due to slow thyroid  or thyroid  disease Rheumatologic: Joint aches and or swelling due to excess weight (Osteoarthritis) and Rheumatoid arthritis Musculoskeletal: Negative for myasthenia gravis, muscular dystrophy, multiple sclerosis or malignant hyperthermia Work History: Retired  Allergies  Ms. Pacifico is allergic to codeine and demerol [meperidine].  Laboratory Chemistry Profile   Renal Lab Results  Component Value Date   BUN 20 11/10/2023   CREATININE 0.83 11/10/2023   BCR 24 11/10/2023   GFRAA >60 07/17/2016   GFRNONAA >60 02/02/2023   SPECGRAV 1.030 12/18/2023   PHUR 6.0 12/18/2023   PROTEINUR 3+ (A) 12/18/2023     Electrolytes Lab Results  Component Value Date   NA 142 11/10/2023   K 4.9 11/10/2023   CL 107 (H) 11/10/2023   CALCIUM  9.0 11/10/2023   MG 2.0 02/02/2023     Hepatic Lab Results  Component Value Date   AST 24 01/27/2023   ALT 16 01/27/2023   ALBUMIN 3.6 01/27/2023   ALKPHOS 76 01/27/2023     ID Lab Results  Component Value Date   SARSCOV2NAA NEGATIVE 01/28/2023     Bone No results found for: VD25OH, [id number], CI6874NY7, CI7874NY7, 25OHVITD1, 25OHVITD2, 25OHVITD3, TESTOFREE, TESTOSTERONE   Endocrine Lab Results  Component Value Date   GLUCOSE 92 11/10/2023   GLUCOSEU 3+ (A) 12/18/2023   HGBA1C 5.9 (H) 11/10/2023   TSH 0.046 (L) 07/15/2020     Neuropathy Lab Results  Component Value Date   HGBA1C 5.9 (H) 11/10/2023     CNS No results found for: COLORCSF, APPEARCSF, RBCCOUNTCSF, WBCCSF, POLYSCSF, LYMPHSCSF, EOSCSF, PROTEINCSF, GLUCCSF, JCVIRUS, CSFOLI, IGGCSF, LABACHR, ACETBL   Inflammation (CRP: Acute   ESR: Chronic) Lab Results  Component Value Date   CRP 2.1 (H) 02/03/2023  LATICACIDVEN 2.0 (HH) 01/28/2023     Rheumatology No results found for: RF, ANA, LABURIC, URICUR, LYMEIGGIGMAB, LYMEABIGMQN, HLAB27   Coagulation Lab Results  Component Value Date   INR 1.2 01/28/2023   LABPROT 15.7 (H) 01/28/2023   APTT 30 01/28/2023   PLT 255 01/30/2023   DDIMER 1.12 (H) 01/29/2023     Cardiovascular Lab Results  Component Value Date   BNP 511.6 (H) 01/27/2023   TROPONINI <0.03 07/17/2016   HGB 10.2 (L) 01/30/2023   HCT 30.9 (L) 01/30/2023     Screening Lab Results  Component Value Date   SARSCOV2NAA NEGATIVE 01/28/2023     Cancer No results found for: CEA, CA125, LABCA2   Allergens No results found for: ALMOND, APPLE, ASPARAGUS, AVOCADO, BANANA, BARLEY, BASIL, BAYLEAF, GREENBEAN, LIMABEAN, WHITEBEAN, BEEFIGE, REDBEET, BLUEBERRY, BROCCOLI, CABBAGE, MELON, CARROT, CASEIN, CASHEWNUT, CAULIFLOWER, CELERY     Note: Lab results reviewed.  PFSH  Drug: Ms. Dolliver  has no history on file for drug use. Alcohol :  has no history on file for alcohol  use. Tobacco:  reports that she has never smoked. She has never been exposed to tobacco smoke. She has never used smokeless tobacco. Medical:  has a past medical history of CHF (congestive heart failure) (HCC), Collagen vascular disease, CVA (cerebral vascular accident) (HCC), Depression, Hyperlipidemia, Muscle pain, Neuropathy, RA (rheumatoid arthritis) (HCC), Thyroid  disease, and Vitamin B 12 deficiency. Family: family history includes Breast cancer in her cousin, maternal aunt, and maternal aunt; COPD in her father; Cancer in her father.  Past Surgical History:  Procedure Laterality Date   ABDOMINAL HYSTERECTOMY     BREAST BIOPSY Left    2 benign biopsies   ECTOPIC PREGNANCY SURGERY     KNEE SURGERY Right    LAMINECTOMY     leg vein stripping Bilateral     TONSILLECTOMY     Active Ambulatory Problems    Diagnosis Date Noted   Rheumatoid arthritis with rheumatoid factor (HCC) 09/20/2013   Acute CVA (cerebrovascular accident) (HCC) 07/17/2016   Bilateral carotid artery disease 07/31/2016   Chronic respiratory failure with hypoxia (HCC) 12/22/2018   Elevated troponin 02/06/2020   Ganglion of joint 06/12/2020   Hyperlipidemia 06/12/2020   Hypothyroidism due to acquired atrophy of thyroid  06/12/2020   Immunosuppression 12/22/2018   Other abnormal glucose 11/25/2013   Pulmonary fibrosis (HCC) 12/21/2018   Recurrent major depressive disorder, in partial remission 06/12/2020   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/12/2020   Sleep apnea 06/12/2020   Acute CHF (congestive heart failure) (HCC) 07/15/2020   Hypertension    Anterior epistaxis    Senile purpura 06/15/2020   Prediabetes 11/25/2013   Meningioma (HCC) 09/15/2020   Memory difficulty 08/31/2020   History of stroke 08/31/2020   Falls frequently 08/31/2020   Chronic diastolic CHF (congestive heart failure) (HCC) 09/15/2020   Ataxic dysarthria 08/31/2020   Acute hypoxemic respiratory failure (HCC) 01/27/2023   Resolved Ambulatory Problems    Diagnosis Date Noted   No Resolved Ambulatory Problems   Past Medical History:  Diagnosis Date   CHF (congestive heart failure) (HCC)    Collagen vascular disease    CVA (cerebral vascular accident) (HCC)    Depression    Muscle pain    Neuropathy    RA (rheumatoid arthritis) (HCC)    Thyroid  disease    Vitamin B 12 deficiency    Constitutional Exam  General appearance: Well nourished, well developed, and well hydrated. In no apparent acute distress Vitals:   03/30/24 1250  BP: ROLLEN)  152/63  Pulse: 73  Resp: 14  Temp: (!) 97.3 F (36.3 C)  TempSrc: Temporal  SpO2: 98%  Weight: 130 lb (59 kg)  Height: 5' 5 (1.651 m)   BMI Assessment: Estimated body mass index is 21.63 kg/m as calculated from the following:   Height as of this  encounter: 5' 5 (1.651 m).   Weight as of this encounter: 130 lb (59 kg).  BMI interpretation table: BMI level Category Range association with higher incidence of chronic pain  <18 kg/m2 Underweight   18.5-24.9 kg/m2 Ideal body weight   25-29.9 kg/m2 Overweight Increased incidence by 20%  30-34.9 kg/m2 Obese (Class I) Increased incidence by 68%  35-39.9 kg/m2 Severe obesity (Class II) Increased incidence by 136%  >40 kg/m2 Extreme obesity (Class III) Increased incidence by 254%   Patient's current BMI Ideal Body weight  Body mass index is 21.63 kg/m. Ideal body weight: 57 kg (125 lb 10.6 oz) Adjusted ideal body weight: 57.8 kg (127 lb 6.4 oz)   BMI Readings from Last 4 Encounters:  03/30/24 21.63 kg/m  01/13/24 19.67 kg/m  12/18/23 21.63 kg/m  11/26/23 21.97 kg/m   Wt Readings from Last 4 Encounters:  03/30/24 130 lb (59 kg)  01/13/24 120 lb (54.4 kg)  12/18/23 132 lb (59.9 kg)  11/26/23 132 lb (59.9 kg)    Psych/Mental status: Alert, oriented x 3 (person, place, & time)       Eyes: PERLA Respiratory: No evidence of acute respiratory distress Patient presents today in wheelchair Contractures of both hands Hematomas noted in both hands Significant limited range of motion of both hands   Assessment  Primary Diagnosis & Pertinent Problem List: The primary encounter diagnosis was Rheumatoid arthritis involving multiple sites, unspecified whether rheumatoid factor present (HCC). Diagnoses of Arthralgia, unspecified joint and Chronic pain syndrome were also pertinent to this visit.  Visit Diagnosis (New problems to examiner): 1. Rheumatoid arthritis involving multiple sites, unspecified whether rheumatoid factor present (HCC)   2. Arthralgia, unspecified joint   3. Chronic pain syndrome    Plan of Care (Initial workup plan)  78 year old female with long-standing, seropositive rheumatoid arthritis with significant hand and forefoot deformities who has previously failed  methotrexate , Enbrel, sulfasalazine, Remicade, and Orencia, and is currently maintained on Humira. She reports progressive tingling in the feet, concerning for peripheral neuropathy, which may be multifactorial (RA, age-related neuropathy, medication effects, metabolic causes). She is also on Plavix , limiting NSAID options. Notably, she has a left-sided 6-mm meningioma followed by neurology (MRI 2022), as well as a history of depression and anxiety, and is here today accompanied by her husband. We discussed a cautious trial of gabapentin, titrated slowly to minimize sedation, dizziness, and fall risk. If inadequate benefit is achieved, future considerations could include pregabalin (Lyrica), or carefully selected low-dose tramadol  or Butrans as adjuncts if benefits outweigh risks.  Continue coordination with rheumatology regarding disease control on Humira, and follow closely for response, tolerability, mood changes, and safety.  Lab Orders         Compliance Drug Analysis, Ur      Pharmacotherapy (current): Medications ordered:  Meds ordered this encounter  Medications   gabapentin (NEURONTIN) 100 MG capsule    Sig: Take 1 capsule (100 mg total) by mouth at bedtime for 15 days, THEN 1 capsule (100 mg total) 2 (two) times daily for 15 days, THEN 1 capsule (100 mg total) 3 (three) times daily.    Dispense:  135 capsule    Refill:  0  Medications administered during this visit: Anneth P. Marrufo had no medications administered during this visit.    Provider-requested follow-up: Return in about 6 weeks (around 05/11/2024) for 2nd visit (discuss Gabapentin, Butrans).  Future Appointments  Date Time Provider Department Center  04/05/2024  2:40 PM [facility] [place] [place] [facility]  04/07/2024  1:15 PM Joesph Dire, OT [facility] None  04/12/2024 10:15 AM Joesph Dire, OT [facility] None  04/14/2024 11:45 AM Joesph Dire, OT [facility] None  04/19/2024  1:15 PM Joesph Dire, OT  [facility] None  04/21/2024  1:15 PM Joesph Dire, OT [facility] None  04/26/2024  1:15 PM Joesph Dire, OT [facility] None  04/28/2024  1:15 PM Joesph Dire, OT [facility] None  05/03/2024  1:15 PM Joesph Dire, OT [facility] None  05/04/2024  2:40 PM Marcelino Nurse, MD [facility] None  05/05/2024  1:15 PM Joesph Dire, OT [facility] None  05/10/2024  1:15 PM Joesph Dire, OT [facility] None  05/12/2024  1:15 PM Joesph Dire, OT [facility] None  05/17/2024  1:15 PM Joesph Dire, OT [facility] None  05/19/2024  1:15 PM Joesph Dire, OT [facility] None  05/24/2024  1:15 PM Joesph Dire, OT [facility] None  05/26/2024  1:15 PM Joesph Dire, OT [facility] None  05/31/2024  1:15 PM Joesph Dire, OT [facility] None  06/02/2024  1:15 PM Joesph Dire, OT [facility] None  06/07/2024  1:15 PM Joesph Dire, OT [facility] None  06/09/2024  1:15 PM Joesph Dire, OT [facility] None  06/14/2024  1:15 PM Joesph Dire, OT [facility] None  06/16/2024  1:15 PM Joesph Dire, OT [facility] None  06/21/2024  1:15 PM Joesph Dire, OT [facility] None  06/23/2024  1:15 PM Joesph Dire, OT [facility] None  06/28/2024  1:15 PM Joesph Dire, OT [facility] None  06/30/2024  1:15 PM Joesph Dire, OT [facility] None  07/05/2024  1:15 PM Joesph Dire, OT [facility] None  07/07/2024  1:15 PM Joesph Dire, OT [facility] None  07/12/2024  1:15 PM Joesph Dire, OT [facility] None  07/14/2024  1:15 PM Joesph Dire, OT [facility] None   I discussed the assessment and treatment plan with the patient. The patient was provided an opportunity to ask questions and all were answered. The patient agreed with the plan and demonstrated an understanding of the instructions.  Patient advised to call back or seek an in-person evaluation if the symptoms or condition worsens.  I personally spent a total of 60 minutes in the care of the patient today  including preparing to see the patient, getting/reviewing separately obtained history, performing a medically appropriate exam/evaluation, counseling and educating, placing orders, and documenting clinical information in the EHR.   Note by: Nurse Marcelino, MD (TTS and AI technology used. I apologize for any typographical errors that were not detected and corrected.) Date: 03/30/2024; Time: 3:11 PM     [1]  Current Outpatient Medications:    Adalimumab 40 MG/0.8ML PSKT, Inject 40 mg into the skin every 14 (fourteen) days. , Disp: , Rfl:    albuterol  (VENTOLIN  HFA) 108 (90 Base) MCG/ACT inhaler, Inhale 1-2 puffs into the lungs every 6 (six) hours as needed for wheezing or shortness of breath., Disp: , Rfl:    clopidogrel  (PLAVIX ) 75 MG tablet, Take 1 tablet (75 mg total) by mouth daily., Disp: 30 tablet, Rfl: 1   FARXIGA 10 MG TABS tablet, Take 10 mg by mouth daily., Disp: , Rfl:    folic acid  (FOLVITE ) 1 MG tablet, Take 1 mg by mouth daily., Disp: , Rfl:  furosemide  (LASIX ) 20 MG tablet, Take 20 mg by mouth., Disp: , Rfl:    gabapentin (NEURONTIN) 100 MG capsule, Take 1 capsule (100 mg total) by mouth at bedtime for 15 days, THEN 1 capsule (100 mg total) 2 (two) times daily for 15 days, THEN 1 capsule (100 mg total) 3 (three) times daily., Disp: 135 capsule, Rfl: 0   hydroxychloroquine (PLAQUENIL) 200 MG tablet, Take 200 mg by mouth 2 (two) times daily. (Patient taking differently: Take 200 mg by mouth 2 (two) times daily. Twice daily on Monday, Wednesday, Friday,  once daily all other days), Disp: , Rfl:    lamoTRIgine  (LAMICTAL ) 100 MG tablet, Take 100 mg by mouth daily., Disp: , Rfl:    levothyroxine  (SYNTHROID ) 112 MCG tablet, Take 1 tablet (112 mcg total) by mouth daily., Disp: 30 tablet, Rfl: 1   magic mouthwash (nystatin, hydrocortisone, diphenhydrAMINE, lidocaine ) suspension, Swish and spit 5 mLs 4 (four) times daily as needed for mouth pain., Disp: , Rfl:    methenamine  (HIPREX ) 1 g  tablet, Take 1 tablet (1 g total) by mouth 2 (two) times daily with a meal., Disp: 180 tablet, Rfl: 3   methotrexate  (RHEUMATREX) 2.5 MG tablet, Take 20 mg by mouth every Saturday., Disp: , Rfl:    mirtazapine  (REMERON ) 15 MG tablet, Take 15 mg by mouth at bedtime., Disp: , Rfl:    nystatin cream (MYCOSTATIN), Apply 1 Application topically 2 (two) times daily., Disp: , Rfl:    ondansetron  (ZOFRAN ) 4 MG tablet, Take 4 mg by mouth every 8 (eight) hours as needed for nausea or vomiting., Disp: , Rfl:    pantoprazole  (PROTONIX ) 40 MG tablet, Take 40 mg by mouth., Disp: , Rfl:    potassium chloride  (KLOR-CON ) 8 MEQ tablet, Take 8 mEq by mouth daily., Disp: , Rfl:    traMADol  (ULTRAM ) 50 MG tablet, Take 1 tablet (50 mg total) by mouth every 12 (twelve) hours as needed., Disp: 10 tablet, Rfl: 0   venlafaxine  XR (EFFEXOR -XR) 150 MG 24 hr capsule, Take 150 mg by mouth daily., Disp: , Rfl:

## 2024-03-30 NOTE — Progress Notes (Signed)
 Safety precautions to be maintained throughout the outpatient stay will include: orient to surroundings, keep bed in low position, maintain call bell within reach at all times, provide assistance with transfer out of bed and ambulation.

## 2024-04-05 ENCOUNTER — Ambulatory Visit

## 2024-04-07 ENCOUNTER — Ambulatory Visit: Attending: Rheumatology | Admitting: Occupational Therapy

## 2024-04-07 DIAGNOSIS — R278 Other lack of coordination: Secondary | ICD-10-CM | POA: Insufficient documentation

## 2024-04-07 DIAGNOSIS — R471 Dysarthria and anarthria: Secondary | ICD-10-CM | POA: Insufficient documentation

## 2024-04-07 DIAGNOSIS — M6281 Muscle weakness (generalized): Secondary | ICD-10-CM | POA: Insufficient documentation

## 2024-04-07 NOTE — Therapy (Signed)
 " OUTPATIENT OCCUPATIONAL THERAPY ORTHO EVALUATION  Patient Name: [name] MRN: [id number] DOB:1945/06/09, 79 y.o., female Today's Date: 04/07/2024  PCP: Dr. Ophelia Fernande JINNY DOUGLAS, MD REFERRING PROVIDER: Defoor, Elsie HERO., PA-C  END OF SESSION:  OT End of Session - 04/07/24 2130     Visit Number 1    Number of Visits 1    Date for Recertification  06/30/24    OT Start Time 1315    OT Stop Time 1400    OT Time Calculation (min) 45 min    Activity Tolerance Patient tolerated treatment well    Behavior During Therapy [facility] for tasks assessed/performed          Past Medical History:  Diagnosis Date   CHF (congestive heart failure) (HCC)    Collagen vascular disease    CVA (cerebral vascular accident) (HCC)    Depression    Hyperlipidemia    Muscle pain    Neuropathy    RA (rheumatoid arthritis) (HCC)    Thyroid  disease    Vitamin B 12 deficiency    Past Surgical History:  Procedure Laterality Date   ABDOMINAL HYSTERECTOMY     BREAST BIOPSY Left    2 benign biopsies   ECTOPIC PREGNANCY SURGERY     KNEE SURGERY Right    LAMINECTOMY     leg vein stripping Bilateral    TONSILLECTOMY     Patient Active Problem List   Diagnosis Date Noted   Acute hypoxemic respiratory failure (HCC) 01/27/2023   Meningioma (HCC) 09/15/2020   Chronic diastolic CHF (congestive heart failure) (HCC) 09/15/2020   Memory difficulty 08/31/2020   History of stroke 08/31/2020   Falls frequently 08/31/2020   Ataxic dysarthria 08/31/2020   Acute CHF (congestive heart failure) (HCC) 07/15/2020   Hypertension    Anterior epistaxis    Senile purpura 06/15/2020   Ganglion of joint 06/12/2020   Hyperlipidemia 06/12/2020   Hypothyroidism due to acquired atrophy of thyroid  06/12/2020   Recurrent major depressive disorder, in partial remission 06/12/2020   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/12/2020   Sleep apnea 06/12/2020   Elevated troponin 02/06/2020   Chronic respiratory failure with hypoxia  (HCC) 12/22/2018   Immunosuppression 12/22/2018   Pulmonary fibrosis (HCC) 12/21/2018   Bilateral carotid artery disease 07/31/2016   Acute CVA (cerebrovascular accident) (HCC) 07/17/2016   Other abnormal glucose 11/25/2013   Prediabetes 11/25/2013   Rheumatoid arthritis with rheumatoid factor (HCC) 09/20/2013    ONSET DATE: 2011  REFERRING DIAG: Bilateral RA  THERAPY DIAG:  Muscle weakness (generalized)  Rationale for Evaluation and Treatment: Rehabilitation  SUBJECTIVE:   SUBJECTIVE STATEMENT: Pt. Present for the evaluation with her husband. Pt. personal care aide waited in the reception area during the initial evaluation. Pt accompanied by: Husband  PERTINENT HISTORY: Pt. is an 79 y.o. female who was diagnosed with RA approximately 15 years ago per Pt./caregiver report.  Per Pt. Chart review, and last Rheumatology visit:   History of present illness:     [name] is a 79 y.o.female who presents today for follow-up evaluation of active end-stage Stage 4 rheumatoid arthritis with positive rheumatoid factor. She was last seen 10/14/2023 and was doing so-so. We checked a vectra panel which showed very good disease control.   She last got hand x-rays completed 07/2021. She last completed a DEXA scan 2021.   [name] was diagnosed with rheumatoid arthritis with positive rheumatoid factor in her late 89s by Dr. Maryl. Diagnosis was supported by positive  RF and anti-CCP.   [name] has tried the following rheumatologic medications in the past: - Methotrexate  - current - Enbrel - stopped due to cost - Sulfasalazine - lack of efficacy - Plaquenil - current - Remicade - reaction - Orencia - lack of efficacy - Humira - current - Prednisone - helped in the past, took chronically for a while   She is doing poorly since last visit, noting ongoing pains in her hands and difficulty using her hands. She notes her symptoms have not significantly changed  in their behavior since last visit, nor the shape of her hands or toes. She notes she does have a sore underneath her right 1st toe which closed but has since reopened, and she sees Dr. Lennie for this later today.     [name] is currently being treated with methotrexate  and plaquenil and folic acid  and humira.  She has not experienced side effects from the medication.  She has not had any infections since last office visit.     She has not had any other significant health changes or unexplained new symptoms since last visit.    [name]'s work status is retired. She is a former smoker who smokes 0.25 ppd for 45 years, for a total of 11.25 pack years. She quit smoking 2008. She drinks an average of 0 servings of alcohol  weekly. [name] reports a family history of RA in her great grandmother and in her two of her cousins but denies any family history of known autoimmune disease otherwise.   Assessment and Plan: Rheumatoid arthritis involving multiple sites with positive rheumatoid factor (CMS/HHS-HCC)  (primary encounter diagnosis) Plan: CBC w/auto Differential (5 Part), Creatinine,        Hepatic Function Panel (HFP), C-Reactive        Protein, Quant - [facility], Sedimentation        Rate-Westergren - [facility], hydroxychloroquine        (PLAQUENIL) 200 mg tablet, Ambulatory Referral        to Occupational Therapy   Encounter for long-term (current) use of high-risk medication   1. Rheumatoid arthritis-severe Stage 3.  Well controlled and Stable.  Significant ongoing pains that appear to be due to existing rheumatoid joint damage and osteoarthritis. Right foot ulcer does not appear infected at present. Podiatry evaluation pending. We will tentatively plan on holding methotrexate  for one month to facilitate healing, while continuing humira therapy unless podiatry see contraindication. I do not see any indication to escalate her DMARD therapy.    2. High risk prescription-This patient is on drug therapy  requiring intensive monitoring for toxicity, including regular lab monitoring and screening for serious or recurrent infections.  Today, I assessed for side effects, infections, new or worsening health conditions that may be related to the medications, and will obtain labs.   Return in about 3 months (around 05/18/2024).    PRECAUTIONS: None  WEIGHT BEARING RESTRICTIONS: No  PAIN:  Are you having pain? 6/10 in th bilateral hands   FALLS: Has patient fallen in last 6 months? 2-3 falls  LIVING ENVIRONMENT: Lives with: lives with their spouse Lives in: House/apartment Stairs: 2 steps to enter,  2 levels-has slept in recliner the past 2-3 years  Has following equipment at home: Raised commode seat  PLOF: Independent prior to onset of RA  PATIENT GOALS: To get much better  OBJECTIVE:  Note: Objective measures were completed at Evaluation unless otherwise noted.  HAND DOMINANCE: Right  ADLs:  Personal care aide 4  days a week 10 am-8 pm  Feeding: uses plastic utensils, and a sipping cup-uses with 2 hands, uses a bib 2/2 drooping food Grooming: Independent, requires set-up of toothpaste onto the toothbrush UE Dressing: Has difficulty donning a shirt, and managing buttons. LE Dressing: Unable to perform LE dressing  Toileting : Requires Assist with all toileting care needs Bathing: Husband/Aide assists with sponge bath Driving: Relies an family and friends Home management: Personal care aide assists with all home management tasks Meal Preparation: Personal care aide cooks Telephone-house phone: Difficulty positioning it near her ear. IPAD: is able to manage IPAD. If she has difficulty with it -Husband assists. Pt. Has difficulty timing the scrolling. Hobbies: Painting, cross stitch.     FUNCTIONAL OUTCOME MEASURES: TBD  UPPER EXTREMITY ROM:   TBD-proximally   ROM Right eval Left eval  Shoulder flexion    Shoulder abduction    Shoulder adduction    Shoulder extension     Shoulder internal rotation    Shoulder external rotation    Elbow flexion    Elbow extension    Wrist flexion 60(70) 50(60)  Wrist extension 52(60) 64(68)  Wrist ulnar deviation 20 20  Wrist radial deviation 28 42  Wrist pronation    Wrist supination    (Blank rows = not tested)   Bilateral hand ROM:   Eval:   At rest Pt. Presents with flexor tightness keeping her bilateral hands in MP, PIP, and DIP flexion.  Right:  -MCP flexor tightness with active(passive) MCP extension 2nd: 50%(75%), 3rd: 50%(75%) of the range, 4th: 50%(75%), 5th: 25%(50%).  -Full PIP extension -Full fist -Thumb IP hyperextension, limited IP flexion.  Left:  -4th digit swan neck -MCP flexor tightness with active(passive) MCP extension 2nd: full, 3rd: 50%(25%) of the range, 4th: 50%(50%), 5th: 50%(50%).  -Limited 2nd &3rd digit flexion      UPPER EXTREMITY MMT:   TBD Proximally  MMT Right eval Left eval  Shoulder flexion    Shoulder abduction    Shoulder adduction    Shoulder extension    Shoulder internal rotation    Shoulder external rotation    Middle trapezius    Lower trapezius    Elbow flexion    Elbow extension    Wrist flexion    Wrist extension    Wrist ulnar deviation    Wrist radial deviation    Wrist pronation    Wrist supination    (Blank rows = not tested)  HAND FUNCTION:  TBD  COORDINATION: TBD  SENSATION: Light touch: WFL  EDEMA:  N/A  COGNITION: Overall cognitive status: Within functional limits for tasks assessed   TREATMENT DATE: 04/07/24  OT initial evaluation was completed, and Pt. education was provided as indicated below.                                                                                                                             PATIENT EDUCATION: Education details: OT services,  POC, goals and ADL/IADL functional Status.  Person educated: Patient Education method: Explanation, Demonstration, Tactile cues, and Verbal  cues Education comprehension: verbalized understanding, returned demonstration, verbal cues required, tactile cues required, and needs further education  HOME EXERCISE PROGRAM:  Continue to assess ongoing need for HEPs, and provide/upgrade as indicated.   GOALS: Goals reviewed with patient? Yes  SHORT TERM GOALS: Target date: 05/19/2024  Pt./caregivers will be independent with HEPs for bilateral hands Baseline: Eval: No current HEP Goal status: INITIAL   LONG TERM GOALS: Target date: 06/30/2024  Pt. Will use utensils with Modified Independence during meals Baseline: Eval: Pt. has difficulty using utensils, and currently uses plastic tableware Goal status: INITIAL  2.  Pt. Will button clothing with modified independence Baseline: Eval: Pt. is unable to fasten buttons  Goal status: INITIAL  4.  Pt. Will demonstrate compensatory/joint protection, and work simplification strategies during ADLs, and IADLs. Baseline: Eval:  Pt. Requires caregiver assist for daily ADL, and IADL care tasks. Goal status: INITIAL  5.  Pt. Will be able to hold and use IPad while efficiently scrolling. Baseline: Eval: Pt. Has difficulty with accurately scrolling efficiently when using the IPad.   Goal status: INITIAL  5.  Pt. Will use her bilateral hands together to reposition the pillows on her couch with minA. Baseline: Eval: Pt. is unable to reposition the pillows on her couch efficiently with her bilateral hands.  Goal status: INITIAL  ASSESSMENT:  CLINICAL IMPRESSION:  Patient is an 79 y.o. female who was seen today for occupational therapy evaluation for bilateral hand pain. Pt. presents with tightness with bilateral hand digit MP, PIP, and DIP flexion. At rest, Pt. holds her bilateral hands in tight flexion. Pt. Is able to achieve some active, and passive digit MP, PIP, DIP extension with increased flexor tightness at the bilateral 2nd through 5th digit MCPs. Pt. presents with 6/10 pain in the  bilateral hands. These limitations affect her ability to engage in, and complete basic ADL, and IADL functioning requiring caregiver assist. Pt. will benefit from OT services to work towards improving BUE functioning, increasing engagement in ADL/IALD functioning, and maximizing independence with ADLs, and IADLs while decreasing caregiver burden. Pt. will also benefit from education about compensatory strategies, joint protection, and work simplification strategies.  PERFORMANCE DEFICITS: in functional skills including ADLs, IADLs, coordination, dexterity, ROM, strength, pain, Fine motor control, and UE functional use, and psychosocial skills including environmental adaptation, interpersonal interactions, and routines and behaviors.   IMPAIRMENTS: are limiting patient from ADLs, IADLs, and leisure.   COMORBIDITIES: may have co-morbidities  that affects occupational performance. Patient will benefit from skilled OT to address above impairments and improve overall function.  MODIFICATION OR ASSISTANCE TO COMPLETE EVALUATION: Min-Moderate modification of tasks or assist with assess necessary to complete an evaluation.  OT OCCUPATIONAL PROFILE AND HISTORY: Detailed assessment: Review of records and additional review of physical, cognitive, psychosocial history related to current functional performance.  CLINICAL DECISION MAKING: Moderate - several treatment options, min-mod task modification necessary  REHAB POTENTIAL: Good  EVALUATION COMPLEXITY: Moderate      PLAN:  OT FREQUENCY: 1-2x/week  OT DURATION: 12 weeks  PLANNED INTERVENTIONS: 97168 OT Re-evaluation, 97535 self care/ADL training, 02889 therapeutic exercise, 97530 therapeutic activity, 97112 neuromuscular re-education, 97140 manual therapy, 97018 paraffin, 02989 moist heat, 97034 contrast bath, 97760 Orthotic Initial, [id number] Orthotic/Prosthetic subsequent, patient/family education, and DME and/or AE instructions  CONSULTED AND  AGREED WITH PLAN OF CARE: Patient and family member/caregiver  PLAN FOR NEXT SESSION:  Treatment  Richardson Otter, MS, OTR/L   04/07/2024, 9:32 PM   "

## 2024-04-08 ENCOUNTER — Other Ambulatory Visit (INDEPENDENT_AMBULATORY_CARE_PROVIDER_SITE_OTHER): Payer: Self-pay | Admitting: Nurse Practitioner

## 2024-04-08 DIAGNOSIS — I739 Peripheral vascular disease, unspecified: Secondary | ICD-10-CM

## 2024-04-09 ENCOUNTER — Ambulatory Visit (INDEPENDENT_AMBULATORY_CARE_PROVIDER_SITE_OTHER)

## 2024-04-09 DIAGNOSIS — I739 Peripheral vascular disease, unspecified: Secondary | ICD-10-CM | POA: Diagnosis not present

## 2024-04-10 LAB — MED LIST ATTACHED SEPARATELY

## 2024-04-10 LAB — MED LIST OPTION NOT SELECTED

## 2024-04-12 ENCOUNTER — Ambulatory Visit: Admitting: Occupational Therapy

## 2024-04-12 DIAGNOSIS — M6281 Muscle weakness (generalized): Secondary | ICD-10-CM

## 2024-04-12 LAB — COMPLIANCE DRUG ANALYSIS, UR

## 2024-04-12 NOTE — Therapy (Signed)
 " OUTPATIENT OCCUPATIONAL THERAPY ORTHO EVALUATION  Patient Name: [name] MRN: [id number] DOB:April 12, 1945, 79 y.o., female Today's Date: 04/12/2024  PCP: Dr. Ophelia Fernande JINNY DOUGLAS, MD REFERRING PROVIDER: Defoor, Elsie HERO., PA-C  END OF SESSION:   OT End of Session - 04/12/24 1714     Visit Number 2    Number of Visits 24    Date for Recertification  06/30/24    OT Start Time 1615    OT Stop Time 1700    OT Time Calculation (min) 45 min    Activity Tolerance Patient tolerated treatment well    Behavior During Therapy [facility] for tasks assessed/performed           Past Medical History:  Diagnosis Date   CHF (congestive heart failure) (HCC)    Collagen vascular disease    CVA (cerebral vascular accident) (HCC)    Depression    Hyperlipidemia    Muscle pain    Neuropathy    RA (rheumatoid arthritis) (HCC)    Thyroid  disease    Vitamin B 12 deficiency    Past Surgical History:  Procedure Laterality Date   ABDOMINAL HYSTERECTOMY     BREAST BIOPSY Left    2 benign biopsies   ECTOPIC PREGNANCY SURGERY     KNEE SURGERY Right    LAMINECTOMY     leg vein stripping Bilateral    TONSILLECTOMY     Patient Active Problem List   Diagnosis Date Noted   Acute hypoxemic respiratory failure (HCC) 01/27/2023   Meningioma (HCC) 09/15/2020   Chronic diastolic CHF (congestive heart failure) (HCC) 09/15/2020   Memory difficulty 08/31/2020   History of stroke 08/31/2020   Falls frequently 08/31/2020   Ataxic dysarthria 08/31/2020   Acute CHF (congestive heart failure) (HCC) 07/15/2020   Hypertension    Anterior epistaxis    Senile purpura 06/15/2020   Ganglion of joint 06/12/2020   Hyperlipidemia 06/12/2020   Hypothyroidism due to acquired atrophy of thyroid  06/12/2020   Recurrent major depressive disorder, in partial remission 06/12/2020   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/12/2020   Sleep apnea 06/12/2020   Elevated troponin 02/06/2020   Chronic respiratory failure with hypoxia  (HCC) 12/22/2018   Immunosuppression 12/22/2018   Pulmonary fibrosis (HCC) 12/21/2018   Bilateral carotid artery disease 07/31/2016   Acute CVA (cerebrovascular accident) (HCC) 07/17/2016   Other abnormal glucose 11/25/2013   Prediabetes 11/25/2013   Rheumatoid arthritis with rheumatoid factor (HCC) 09/20/2013    ONSET DATE: 2011  REFERRING DIAG: RA Bilateral hands  THERAPY DIAG:  Muscle weakness (generalized)  Rationale for Evaluation and Treatment: Rehabilitation  SUBJECTIVE:   SUBJECTIVE STATEMENT: Pt. reports that her  speech has changed, and may need to resume speech therapy. Pt accompanied by: Husband  PERTINENT HISTORY: Pt. is an 79 y.o. female who was diagnosed with RA approximately 15 years ago per Pt./caregiver report.  Per Pt. Chart review, and last Rheumatology visit:   History of present illness:     [name] is a 79 y.o.female who presents today for follow-up evaluation of active end-stage Stage 4 rheumatoid arthritis with positive rheumatoid factor. She was last seen 10/14/2023 and was doing so-so. We checked a vectra panel which showed very good disease control.   She last got hand x-rays completed 07/2021. She last completed a DEXA scan 2021.   [name] was diagnosed with rheumatoid arthritis with positive rheumatoid factor in her late 22s by Dr. Maryl. Diagnosis was supported by positive RF and anti-CCP.  [name] has tried the following rheumatologic medications in the past: - Methotrexate  - current - Enbrel - stopped due to cost - Sulfasalazine - lack of efficacy - Plaquenil - current - Remicade - reaction - Orencia - lack of efficacy - Humira - current - Prednisone - helped in the past, took chronically for a while   She is doing poorly since last visit, noting ongoing pains in her hands and difficulty using her hands. She notes her symptoms have not significantly changed in their behavior since last visit, nor the  shape of her hands or toes. She notes she does have a sore underneath her right 1st toe which closed but has since reopened, and she sees Dr. Lennie for this later today.     [name] is currently being treated with methotrexate  and plaquenil and folic acid  and humira.  She has not experienced side effects from the medication.  She has not had any infections since last office visit.     She has not had any other significant health changes or unexplained new symptoms since last visit.    [name]'s work status is retired. She is a former smoker who smokes 0.25 ppd for 45 years, for a total of 11.25 pack years. She quit smoking 2008. She drinks an average of 0 servings of alcohol  weekly. [name] reports a family history of RA in her great grandmother and in her two of her cousins but denies any family history of known autoimmune disease otherwise.   Assessment and Plan: Rheumatoid arthritis involving multiple sites with positive rheumatoid factor (CMS/HHS-HCC)  (primary encounter diagnosis) Plan: CBC w/auto Differential (5 Part), Creatinine,        Hepatic Function Panel (HFP), C-Reactive        Protein, Quant - [facility], Sedimentation        Rate-Westergren - [facility], hydroxychloroquine        (PLAQUENIL) 200 mg tablet, Ambulatory Referral        to Occupational Therapy   Encounter for long-term (current) use of high-risk medication   1. Rheumatoid arthritis-severe Stage 3.  Well controlled and Stable.  Significant ongoing pains that appear to be due to existing rheumatoid joint damage and osteoarthritis. Right foot ulcer does not appear infected at present. Podiatry evaluation pending. We will tentatively plan on holding methotrexate  for one month to facilitate healing, while continuing humira therapy unless podiatry see contraindication. I do not see any indication to escalate her DMARD therapy.    2. High risk prescription-This patient is on drug therapy requiring intensive monitoring for toxicity,  including regular lab monitoring and screening for serious or recurrent infections.  Today, I assessed for side effects, infections, new or worsening health conditions that may be related to the medications, and will obtain labs.   Return in about 3 months (around 05/18/2024).    PRECAUTIONS: None  WEIGHT BEARING RESTRICTIONS: No  PAIN:  04/12/24: No pain reported Are you having pain? 6/10 in th bilateral hands   FALLS: Has patient fallen in last 6 months? 2-3 falls  LIVING ENVIRONMENT: Lives with: lives with their spouse Lives in: House/apartment Stairs: 2 steps to enter,  2 levels-has slept in recliner the past 2-3 years  Has following equipment at home: Raised commode seat  PLOF: Independent prior to onset of RA  PATIENT GOALS: To get much better  OBJECTIVE:  Note: Objective measures were completed at Evaluation unless otherwise noted.  HAND DOMINANCE: Right  ADLs:  Personal care aide 4  days a week 10 am-8 pm  Feeding: uses plastic utensils, and a sipping cup-uses with 2 hands, uses a bib 2/2 drooping food Grooming: Independent, requires set-up of toothpaste onto the toothbrush UE Dressing: Has difficulty donning a shirt, and managing buttons. LE Dressing: Unable to perform LE dressing  Toileting : Requires Assist with all toileting care needs Bathing: Husband/Aide assists with sponge bath Driving: Relies an family and friends Home management: Personal care aide assists with all home management tasks Meal Preparation: Personal care aide cooks Telephone-house phone: Difficulty positioning it near her ear. IPAD: is able to manage IPAD. If she has difficulty with it -Husband assists. Pt. Has difficulty timing the scrolling. Hobbies: Painting, cross stitch.     FUNCTIONAL OUTCOME MEASURES: TBD  UPPER EXTREMITY ROM:   TBD-proximally   ROM Right eval Left eval  Shoulder flexion    Shoulder abduction    Shoulder adduction    Shoulder extension    Shoulder  internal rotation    Shoulder external rotation    Elbow flexion    Elbow extension    Wrist flexion 60(70) 50(60)  Wrist extension 52(60) 64(68)  Wrist ulnar deviation 20 20  Wrist radial deviation 28 42  Wrist pronation    Wrist supination    (Blank rows = not tested)   Bilateral hand ROM:   Eval:   At rest Pt. Presents with flexor tightness keeping her bilateral hands in MP, PIP, and DIP flexion.  Right:  -MCP flexor tightness with active(passive) MCP extension 2nd: 50%(75%), 3rd: 50%(75%) of the range, 4th: 50%(75%), 5th: 25%(50%).  -Full PIP extension -Full fist -Thumb IP hyperextension, limited IP flexion.  Left:  -4th digit swan neck -MCP flexor tightness with active(passive) MCP extension 2nd: full, 3rd: 50%(25%) of the range, 4th: 50%(50%), 5th: 50%(50%).  -Limited 2nd &3rd digit flexion      UPPER EXTREMITY MMT:   TBD Proximally  MMT Right eval Left eval  Shoulder flexion    Shoulder abduction    Shoulder adduction    Shoulder extension    Shoulder internal rotation    Shoulder external rotation    Middle trapezius    Lower trapezius    Elbow flexion    Elbow extension    Wrist flexion    Wrist extension    Wrist ulnar deviation    Wrist radial deviation    Wrist pronation    Wrist supination    (Blank rows = not tested)  HAND FUNCTION:  TBD  COORDINATION: TBD  SENSATION: Light touch: WFL  EDEMA:  N/A  COGNITION: Overall cognitive status: Within functional limits for tasks assessed   TREATMENT DATE: 04/12/24  Moist heat modality to applied to the bilateral hands for 5 min. Prior to manual therapy, and ROM.   Manual Therapy:  -Pt. tolerated soft tissue massage to the bilateral hands at the volar, dorsal, medial and lateral aspect of the bilateral hands and digit MP, PIP, and DIP joints, thumb MP, and IP joint, the thenar, and hypothenar eminence, as well as the 1st dorsal interossei to increase circulation, 2/2 joint  stiffness. - -Manual therapy was performed prior to and in conjunction with there. Ex.   There ex:  -AROM/gentle PROM was performed to the bilateral hand digit MP, PIP, DIP extension, as well as thumb radial deviation, and thumb IP extension.  Self-care:  -Pt. education was provided about buttonhook use as an adaptive and joint protective method for buttoning.  -Pt. requires modA to use the buttonhook  as well as cues for visual demonstration for proper technique.    PATIENT EDUCATION: Education details: OT services, POC, goals and ADL/IADL functional Status.  Person educated: Patient Education method: Explanation, Demonstration, Tactile cues, and Verbal cues Education comprehension: verbalized understanding, returned demonstration, verbal cues required, tactile cues required, and needs further education  HOME EXERCISE PROGRAM:  Continue to assess ongoing need for HEPs, and provide/upgrade as indicated.   GOALS: Goals reviewed with patient? Yes  SHORT TERM GOALS: Target date: 05/19/2024  Pt./caregivers will be independent with HEPs for bilateral hands Baseline: Eval: No current HEP Goal status: INITIAL   LONG TERM GOALS: Target date: 06/30/2024  Pt. Will use utensils with Modified Independence during meals Baseline: Eval: Pt. has difficulty using utensils, and currently uses plastic tableware Goal status: INITIAL  2.  Pt. Will button clothing with modified independence Baseline: Eval: Pt. is unable to fasten buttons  Goal status: INITIAL  4.  Pt. Will demonstrate compensatory/joint protection, and work simplification strategies during ADLs, and IADLs. Baseline: Eval:  Pt. Requires caregiver assist for daily ADL, and IADL care tasks. Goal status: INITIAL  5.  Pt. Will be able to hold and use IPad while efficiently scrolling. Baseline: Eval: Pt. Has difficulty with accurately scrolling efficiently when using the IPad.   Goal status: INITIAL  5.  Pt. Will use her bilateral  hands together to reposition the pillows on her couch with minA. Baseline: Eval: Pt. is unable to reposition the pillows on her couch efficiently with her bilateral hands.  Goal status: INITIAL  ASSESSMENT:  CLINICAL IMPRESSION:  Pt. reports that her speech has changed, and is requesting to be assessed by Speech Therapy. An order for a consult was submitted per Pt. request. Pt. tolerated moist heat, STM, and stretches well today without increased Pain. Pt. continues to present with limited digit ROM 2/2 contractures in the bilateral hands. Pt. Required modA to use the buttonhook, requiring cues, and assist to thread the buttonhook through the the button holes. Pt. continues to benefit from OT services to work towards improving BUE functioning, increasing engagement in ADL/IALD functioning, and maximizing independence with ADLs, and IADLs while decreasing caregiver burden. Pt. will also benefit from education about compensatory strategies, joint protection, and work simplification strategies.  PERFORMANCE DEFICITS: in functional skills including ADLs, IADLs, coordination, dexterity, ROM, strength, pain, Fine motor control, and UE functional use, and psychosocial skills including environmental adaptation, interpersonal interactions, and routines and behaviors.   IMPAIRMENTS: are limiting patient from ADLs, IADLs, and leisure.   COMORBIDITIES: may have co-morbidities  that affects occupational performance. Patient will benefit from skilled OT to address above impairments and improve overall function.  MODIFICATION OR ASSISTANCE TO COMPLETE EVALUATION: Min-Moderate modification of tasks or assist with assess necessary to complete an evaluation.  OT OCCUPATIONAL PROFILE AND HISTORY: Detailed assessment: Review of records and additional review of physical, cognitive, psychosocial history related to current functional performance.  CLINICAL DECISION MAKING: Moderate - several treatment options, min-mod  task modification necessary  REHAB POTENTIAL: Good  EVALUATION COMPLEXITY: Moderate      PLAN:  OT FREQUENCY: 1-2x/week  OT DURATION: 12 weeks  PLANNED INTERVENTIONS: 97168 OT Re-evaluation, 97535 self care/ADL training, 02889 therapeutic exercise, 97530 therapeutic activity, 97112 neuromuscular re-education, 97140 manual therapy, 97018 paraffin, 02989 moist heat, 97034 contrast bath, 97760 Orthotic Initial, [id number] Orthotic/Prosthetic subsequent, patient/family education, and DME and/or AE instructions  CONSULTED AND AGREED WITH PLAN OF CARE: Patient and family member/caregiver  PLAN FOR NEXT SESSION: Treatment  Richardson  George Alcantar, MS, OTR/L   04/12/2024, 5:26 PM   "

## 2024-04-12 NOTE — Addendum Note (Signed)
 Addended by: Allene Furuya M on: 04/12/2024 05:23 PM   Modules accepted: Orders

## 2024-04-13 ENCOUNTER — Ambulatory Visit (INDEPENDENT_AMBULATORY_CARE_PROVIDER_SITE_OTHER): Admitting: Nurse Practitioner

## 2024-04-13 ENCOUNTER — Encounter (INDEPENDENT_AMBULATORY_CARE_PROVIDER_SITE_OTHER): Payer: Self-pay | Admitting: Nurse Practitioner

## 2024-04-13 VITALS — BP 136/80 | HR 73 | Resp 17 | Ht 65.0 in | Wt 130.0 lb

## 2024-04-13 DIAGNOSIS — I739 Peripheral vascular disease, unspecified: Secondary | ICD-10-CM | POA: Diagnosis not present

## 2024-04-13 DIAGNOSIS — I872 Venous insufficiency (chronic) (peripheral): Secondary | ICD-10-CM | POA: Diagnosis not present

## 2024-04-13 NOTE — Progress Notes (Signed)
 "  Subjective:    Patient ID: [name], female    DOB: 1945/10/23, 79 y.o.   MRN: [id number] Chief Complaint  Patient presents with   New Patient (Initial Visit)    Urgent. np. ABI (scheduled 04/09/24) + consult. PVD  ref: Lennie Barter    HPI  Discussed the use of AI scribe software for clinical note transcription with the patient, who gave verbal consent to proceed.  History of Present Illness [name] is an 79 year old female with peripheral vascular disease, venous insufficiency, and chronic foot wounds who presents for evaluation of lower extremity pain, discoloration, and non-healing wounds.  She reports persistent and significant pain in her feet and legs, described as constant and severe enough to prevent her from wearing shoes. She ambulates primarily on her toes rather than flat-footed, a pattern that has developed over the past two years, and her family notes minimal plantar padding contributing to her pain. Pads have been used in her shoes and on her feet to alleviate pressure, but she continues to have difficulty.  She has chronic, recurrent wounds on her feet, most notably on the right hallux, which is associated with a large callus. The wounds exhibit periods of healing followed by recurrence. This morning, her right foot was noted to be purple. She receives ongoing wound care from Dr. Lennie.  She experiences swelling in her legs and feet, which she manages with leg elevation and diuretic therapy. Her feet become cool and change color when dependent, with color returning upon elevation. She has previously worn compression socks.  She has restless leg syndrome and chronic pain, currently managed with a titrated medication regimen. 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  She has previously undergone vein stripping and sclerotherapy for spider veins, with approximately forty spider veins injected in one  week.    Results Diagnostic Ankle-brachial index, right leg (04/09/2024): 1.10 Ankle-brachial index, left leg (04/09/2024): 1.05 Toe-brachial index, right great toe (04/09/2024): 0.81 Toe-brachial index, left great toe (04/09/2024): 0.95   Review of Systems     Objective:   Physical Exam  Physical Exam EXTREMITIES: Right big toe with large callus. Foot discoloration present.  BP 136/80   Pulse 73   Resp 17   Ht 5' 5 (1.651 m)   Wt 130 lb (59 kg) Comment: patient reported  BMI 21.63 kg/m   Past Medical History:  Diagnosis Date   CHF (congestive heart failure) (HCC)    Collagen vascular disease    CVA (cerebral vascular accident) (HCC)    Depression    Hyperlipidemia    Muscle pain    Neuropathy    RA (rheumatoid arthritis) (HCC)    Thyroid  disease    Vitamin B 12 deficiency     Social History   Socioeconomic History   Marital status: Married    Spouse name: Not on file   Number of children: Not on file   Years of education: Not on file   Highest education level: Not on file  Occupational History   Not on file  Tobacco Use   Smoking status: Never    Passive exposure: Never   Smokeless tobacco: Never  Substance and Sexual Activity   Alcohol  use: Not on file   Drug use: Not on file   Sexual activity: Not on file  Other Topics Concern   Not on file  Social History Narrative   Not on file  Social Drivers of Health   Tobacco Use: Low Risk (04/13/2024)   Patient History    Smoking Tobacco Use: Never    Smokeless Tobacco Use: Never    Passive Exposure: Never  Recent Concern: Tobacco Use - Medium Risk (04/06/2024)   Received from [facility]   Patient History    Smoking Tobacco Use: Former    Smokeless Tobacco Use: Never    Passive Exposure: Not on file  Financial Resource Strain: Low Risk  (09/25/2023)   Received from [facility]   Overall Financial Resource Strain (CARDIA)    Difficulty of Paying Living  Expenses: Not hard at all  Food Insecurity: No Food Insecurity (09/25/2023)   Received from [facility]   Epic    Within the past 12 months, you worried that your food would run out before you got the money to buy more.: Never true    Within the past 12 months, the food you bought just didn't last and you didn't have money to get more.: Never true  Transportation Needs: No Transportation Needs (09/25/2023)   Received from [facility] - Transportation    In the past 12 months, has lack of transportation kept you from medical appointments or from getting medications?: No    Lack of Transportation (Non-Medical): No  Physical Activity: Insufficiently Active (05/23/2022)   Received from [facility]   Exercise Vital Sign    On average, how many days per week do you engage in moderate to strenuous exercise (like a brisk walk)?: 5 days    On average, how many minutes do you engage in exercise at this level?: 10 min  Stress: Stress Concern Present (05/23/2022)   Received from [facility] - Occupational Stress Questionnaire    Feeling of Stress : To some extent  Social Connections: Socially Integrated (05/23/2022)   Received from [facility]   Social Connection and Isolation Panel    In a typical week, how many times do you talk on the phone with family, friends, or neighbors?: Three times a week    How often do you get together with friends or relatives?: More than three times a week    How often do you attend church or religious services?: More than 4 times per year    Do you belong to any clubs or organizations such as church groups, unions, fraternal or athletic groups, or school groups?: Yes    How often do you attend meetings of the clubs or organizations you belong to?: Never    Are you married, widowed, divorced, separated, never married, or living with a  partner?: Married  Intimate Partner Violence: Not At Risk (01/28/2023)   Humiliation, Afraid, Rape, and Kick questionnaire    Fear of Current or Ex-Partner: No    Emotionally Abused: No    Physically Abused: No    Sexually Abused: No  Depression (PHQ2-9): Low Risk (03/30/2024)   Depression (PHQ2-9)    PHQ-2 Score: 0  Alcohol  Screen: Not on file  Housing: Low Risk  (03/03/2024)   Received from [facility]   Epic    In the last 12 months, was there a time when you were not able to pay the mortgage or rent on time?: No    In the past 12 months, how many times have you moved where you were living?: 0  At any time in the past 12 months, were you homeless or living in a shelter (including now)?: No  Utilities: Not At Risk (09/25/2023)   Received from [facility]   Epic    In the past 12 months has the electric, gas, oil, or water company threatened to shut off services in your home?: No  Health Literacy: Not on file    Past Surgical History:  Procedure Laterality Date   ABDOMINAL HYSTERECTOMY     BREAST BIOPSY Left    2 benign biopsies   ECTOPIC PREGNANCY SURGERY     KNEE SURGERY Right    LAMINECTOMY     leg vein stripping Bilateral    TONSILLECTOMY      Family History  Problem Relation Age of Onset   COPD Father    Cancer Father    Breast cancer Cousin    Breast cancer Maternal Aunt    Breast cancer Maternal Aunt     Allergies[1]     Latest Ref Rng & Units 01/30/2023    4:31 AM 01/29/2023    5:35 AM 01/28/2023    5:00 AM  CBC  WBC 4.0 - 10.5 K/uL 5.2  6.1  4.9   Hemoglobin 12.0 - 15.0 g/dL 89.7  9.7  9.6   Hematocrit 36.0 - 46.0 % 30.9  30.5  29.7   Platelets 150 - 400 K/uL 255  248  201       CMP     Component Value Date/Time   NA 142 11/10/2023 1303   K 4.9 11/10/2023 1303   CL 107 (H) 11/10/2023 1303   CO2 19 (L) 11/10/2023 1303   GLUCOSE 92 11/10/2023 1303   GLUCOSE 102 (H) 02/02/2023 0430   BUN 20 11/10/2023  1303   CREATININE 0.83 11/10/2023 1303   CALCIUM  9.0 11/10/2023 1303   PROT 7.4 01/27/2023 1915   ALBUMIN 3.6 01/27/2023 1915   AST 24 01/27/2023 1915   ALT 16 01/27/2023 1915   ALKPHOS 76 01/27/2023 1915   BILITOT 1.4 (H) 01/27/2023 1915   EGFR 72 11/10/2023 1303   GFRNONAA >60 02/02/2023 0430     VAS US  ABI WITH/WO TBI Result Date: 04/12/2024  LOWER EXTREMITY DOPPLER STUDY Patient Name:  [name]  Date of Exam:   04/09/2024 Medical Rec #: [id number]       Accession #:    [id number] Date of Birth: 1946/02/07      Patient Gender: F Patient Age:   12 years Exam Location:  [facility] Procedure:      VAS US  ABI WITH/WO TBI Referring Phys: ORVIN DARING --------------------------------------------------------------------------------  Indications: Peripheral artery disease.  Performing Technologist: Elsie Churn RT, RDMS, RVT  Examination Guidelines: A complete evaluation includes at minimum, Doppler waveform signals and systolic blood pressure reading at the level of bilateral brachial, anterior tibial, and posterior tibial arteries, when vessel segments are accessible. Bilateral testing is considered an integral part of a complete examination. Photoelectric Plethysmograph (PPG) waveforms and toe systolic pressure readings are included as required and additional duplex testing as needed. Limited examinations for reoccurring indications may be performed as noted.  ABI Findings: +---------+------------------+-----+---------+-------+ Right    Rt Pressure (mmHg)IndexWaveform Comment +---------+------------------+-----+---------+-------+ Brachial 150                                     +---------+------------------+-----+---------+-------+ PTA      165  1.10 biphasic         +---------+------------------+-----+---------+-------+ DP       160               1.07 triphasic        +---------+------------------+-----+---------+-------+ Great Toe122                0.81 Abnormal         +---------+------------------+-----+---------+-------+ +---------+------------------+-----+---------+-------+ Left     Lt Pressure (mmHg)IndexWaveform Comment +---------+------------------+-----+---------+-------+ Brachial 142                                     +---------+------------------+-----+---------+-------+ PTA      158               1.05 biphasic         +---------+------------------+-----+---------+-------+ DP       153               1.02 triphasic        +---------+------------------+-----+---------+-------+ Great Toe142               0.95 Normal           +---------+------------------+-----+---------+-------+  Summary: Bilateral: Bilateral ankle-brachial indexes are within normal range. Bilateral toe-brachial indexes are within normal range.  *See table(s) above for measurements and observations.  Electronically signed by Selinda Gu MD on 04/12/2024 at 8:28:37 AM.    Final        Assessment & Plan:   1. PVD (peripheral vascular disease) (Primary) Peripheral vascular disease Mild peripheral vascular disease with normal ABIs and toe pressures. Biphasic waveform in posterior tibial artery indicates mild disease without significant flow limitation. Pain improves with ambulation. Risks of intervention outweigh benefits. - Scheduled vascular follow-up in 6 months, with earlier evaluation if symptoms worsen or wounds fail to heal. - Continued conservative management; no vascular intervention indicated. - VAS US  ABI WITH/WO TBI; Future  2. Chronic venous insufficiency Venous insufficiency Chronic venous insufficiency with lower extremity edema and discoloration improving with leg elevation. Status post vein stripping and sclerotherapy. - Continued leg elevation as tolerated to manage edema and discoloration.    Medications Ordered Prior to Encounter[2]  There are no Patient Instructions on file for this visit. Return in about 6  months (around 10/11/2024) for 6 month ABI JD/FB.   Shanita Kanan E Lawren Sexson, NP      [1]  Allergies Allergen Reactions   Codeine Nausea And Vomiting and Other (See Comments)   Demerol [Meperidine] Nausea And Vomiting  [2]  Current Outpatient Medications on File Prior to Visit  Medication Sig Dispense Refill   Adalimumab 40 MG/0.8ML PSKT Inject 40 mg into the skin every 14 (fourteen) days.      albuterol  (VENTOLIN  HFA) 108 (90 Base) MCG/ACT inhaler Inhale 1-2 puffs into the lungs every 6 (six) hours as needed for wheezing or shortness of breath.     clopidogrel  (PLAVIX ) 75 MG tablet Take 1 tablet (75 mg total) by mouth daily. 30 tablet 1   FARXIGA 10 MG TABS tablet Take 10 mg by mouth daily.     folic acid  (FOLVITE ) 1 MG tablet Take 1 mg by mouth daily.     furosemide  (LASIX ) 20 MG tablet Take 20 mg by mouth.     gabapentin  (NEURONTIN ) 100 MG capsule Take 1 capsule (100 mg total) by mouth at bedtime for 15 days, THEN 1 capsule (100 mg total) 2 (two)  times daily for 15 days, THEN 1 capsule (100 mg total) 3 (three) times daily. 135 capsule 0   levothyroxine  (SYNTHROID ) 112 MCG tablet Take 1 tablet (112 mcg total) by mouth daily. 30 tablet 1   magic mouthwash (nystatin, hydrocortisone, diphenhydrAMINE, lidocaine ) suspension Swish and spit 5 mLs 4 (four) times daily as needed for mouth pain.     methenamine  (HIPREX ) 1 g tablet Take 1 tablet (1 g total) by mouth 2 (two) times daily with a meal. 180 tablet 3   methotrexate  (RHEUMATREX) 2.5 MG tablet Take 20 mg by mouth every Saturday.     mirtazapine  (REMERON ) 15 MG tablet Take 15 mg by mouth at bedtime.     nystatin cream (MYCOSTATIN) Apply 1 Application topically 2 (two) times daily.     ondansetron  (ZOFRAN ) 4 MG tablet Take 4 mg by mouth every 8 (eight) hours as needed for nausea or vomiting.     potassium chloride  (KLOR-CON ) 8 MEQ tablet Take 8 mEq by mouth daily.     traMADol  (ULTRAM ) 50 MG tablet Take 1 tablet (50 mg total) by mouth every 12  (twelve) hours as needed. 10 tablet 0   venlafaxine  XR (EFFEXOR -XR) 150 MG 24 hr capsule Take 150 mg by mouth daily.     hydroxychloroquine (PLAQUENIL) 200 MG tablet Take 200 mg by mouth 2 (two) times daily. (Patient taking differently: Take 200 mg by mouth 2 (two) times daily. Twice daily on Monday, Wednesday, Friday,  once daily all other days)     lamoTRIgine  (LAMICTAL ) 100 MG tablet Take 100 mg by mouth daily.     pantoprazole  (PROTONIX ) 40 MG tablet Take 40 mg by mouth.     No current facility-administered medications on file prior to visit.   "

## 2024-04-14 ENCOUNTER — Ambulatory Visit: Admitting: Occupational Therapy

## 2024-04-14 DIAGNOSIS — M6281 Muscle weakness (generalized): Secondary | ICD-10-CM

## 2024-04-14 NOTE — Therapy (Signed)
 " OUTPATIENT OCCUPATIONAL THERAPY ORTHO TREATMENT  Patient Name: [name] MRN: [id number] DOB:17-Jul-1945, 79 y.o., female Today's Date: 04/14/2024  PCP: Dr. Ophelia Fernande JINNY DOUGLAS, MD REFERRING PROVIDER: Defoor, Elsie HERO., PA-C  END OF SESSION:   OT End of Session - 04/14/24 1641     Visit Number 3    Number of Visits 24    Date for Recertification  06/30/24    OT Start Time 1150    OT Stop Time 1230    OT Time Calculation (min) 40 min    Activity Tolerance Patient tolerated treatment well    Behavior During Therapy [facility] for tasks assessed/performed           Past Medical History:  Diagnosis Date   CHF (congestive heart failure) (HCC)    Collagen vascular disease    CVA (cerebral vascular accident) (HCC)    Depression    Hyperlipidemia    Muscle pain    Neuropathy    RA (rheumatoid arthritis) (HCC)    Thyroid  disease    Vitamin B 12 deficiency    Past Surgical History:  Procedure Laterality Date   ABDOMINAL HYSTERECTOMY     BREAST BIOPSY Left    2 benign biopsies   ECTOPIC PREGNANCY SURGERY     KNEE SURGERY Right    LAMINECTOMY     leg vein stripping Bilateral    TONSILLECTOMY     Patient Active Problem List   Diagnosis Date Noted   Acute hypoxemic respiratory failure (HCC) 01/27/2023   Meningioma (HCC) 09/15/2020   Chronic diastolic CHF (congestive heart failure) (HCC) 09/15/2020   Memory difficulty 08/31/2020   History of stroke 08/31/2020   Falls frequently 08/31/2020   Ataxic dysarthria 08/31/2020   Acute CHF (congestive heart failure) (HCC) 07/15/2020   Hypertension    Anterior epistaxis    Senile purpura 06/15/2020   Ganglion of joint 06/12/2020   Hyperlipidemia 06/12/2020   Hypothyroidism due to acquired atrophy of thyroid  06/12/2020   Recurrent major depressive disorder, in partial remission 06/12/2020   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/12/2020   Sleep apnea 06/12/2020   Elevated troponin 02/06/2020   Chronic respiratory failure with hypoxia  (HCC) 12/22/2018   Immunosuppression 12/22/2018   Pulmonary fibrosis (HCC) 12/21/2018   Bilateral carotid artery disease 07/31/2016   Acute CVA (cerebrovascular accident) (HCC) 07/17/2016   Other abnormal glucose 11/25/2013   Prediabetes 11/25/2013   Rheumatoid arthritis with rheumatoid factor (HCC) 09/20/2013    ONSET DATE: 2011  REFERRING DIAG: RA Bilateral hands  THERAPY DIAG:  Muscle weakness (generalized)  Rationale for Evaluation and Treatment: Rehabilitation  SUBJECTIVE:   SUBJECTIVE STATEMENT: Pt. reports that she did not sleep well last night. Pt accompanied by: Husband  PERTINENT HISTORY: Pt. is an 79 y.o. female who was diagnosed with RA approximately 15 years ago per Pt./caregiver report.  Per Pt. Chart review, and last Rheumatology visit:   History of present illness:     Ona Roehrs is a 79 y.o.female who presents today for follow-up evaluation of active end-stage Stage 4 rheumatoid arthritis with positive rheumatoid factor. She was last seen 10/14/2023 and was doing so-so. We checked a vectra panel which showed very good disease control.   She last got hand x-rays completed 07/2021. She last completed a DEXA scan 2021.   Lin Hackmann was diagnosed with rheumatoid arthritis with positive rheumatoid factor in her late 76s by Dr. Maryl. Diagnosis was supported by positive RF and anti-CCP.   Niels Janifer Blush  has tried the following rheumatologic medications in the past: - Methotrexate  - current - Enbrel - stopped due to cost - Sulfasalazine - lack of efficacy - Plaquenil - current - Remicade - reaction - Orencia - lack of efficacy - Humira - current - Prednisone - helped in the past, took chronically for a while   She is doing poorly since last visit, noting ongoing pains in her hands and difficulty using her hands. She notes her symptoms have not significantly changed in their behavior since last visit, nor the shape of her hands or toes. She  notes she does have a sore underneath her right 1st toe which closed but has since reopened, and she sees Dr. Lennie for this later today.     Ms. Mcgeehan is currently being treated with methotrexate  and plaquenil and folic acid  and humira.  She has not experienced side effects from the medication.  She has not had any infections since last office visit.     She has not had any other significant health changes or unexplained new symptoms since last visit.    Catriona's work status is retired. She is a former smoker who smokes 0.25 ppd for 45 years, for a total of 11.25 pack years. She quit smoking 2008. She drinks an average of 0 servings of alcohol  weekly. Ardith reports a family history of RA in her great grandmother and in her two of her cousins but denies any family history of known autoimmune disease otherwise.   Assessment and Plan: Rheumatoid arthritis involving multiple sites with positive rheumatoid factor (CMS/HHS-HCC)  (primary encounter diagnosis) Plan: CBC w/auto Differential (5 Part), Creatinine,        Hepatic Function Panel (HFP), C-Reactive        Protein, Quant - [facility], Sedimentation        Rate-Westergren - [facility], hydroxychloroquine        (PLAQUENIL) 200 mg tablet, Ambulatory Referral        to Occupational Therapy   Encounter for long-term (current) use of high-risk medication   1. Rheumatoid arthritis-severe Stage 3.  Well controlled and Stable.  Significant ongoing pains that appear to be due to existing rheumatoid joint damage and osteoarthritis. Right foot ulcer does not appear infected at present. Podiatry evaluation pending. We will tentatively plan on holding methotrexate  for one month to facilitate healing, while continuing humira therapy unless podiatry see contraindication. I do not see any indication to escalate her DMARD therapy.    2. High risk prescription-This patient is on drug therapy requiring intensive monitoring for toxicity, including regular lab  monitoring and screening for serious or recurrent infections.  Today, I assessed for side effects, infections, new or worsening health conditions that may be related to the medications, and will obtain labs.   Return in about 3 months (around 05/18/2024).    PRECAUTIONS: None  WEIGHT BEARING RESTRICTIONS: No  PAIN:  04/14/24: 4/10 pain in the bilateral hands 04/12/24: No pain reported Are you having pain? 6/10 in th bilateral hands   FALLS: Has patient fallen in last 6 months? 2-3 falls  LIVING ENVIRONMENT: Lives with: lives with their spouse Lives in: House/apartment Stairs: 2 steps to enter,  2 levels-has slept in recliner the past 2-3 years  Has following equipment at home: Raised commode seat  PLOF: Independent prior to onset of RA  PATIENT GOALS: To get much better  OBJECTIVE:  Note: Objective measures were completed at Evaluation unless otherwise noted.  HAND DOMINANCE: Right  ADLs:  Personal  care aide 4  days a week 10 am-8 pm  Feeding: uses plastic utensils, and a sipping cup-uses with 2 hands, uses a bib 2/2 drooping food Grooming: Independent, requires set-up of toothpaste onto the toothbrush UE Dressing: Has difficulty donning a shirt, and managing buttons. LE Dressing: Unable to perform LE dressing  Toileting : Requires Assist with all toileting care needs Bathing: Husband/Aide assists with sponge bath Driving: Relies an family and friends Home management: Personal care aide assists with all home management tasks Meal Preparation: Personal care aide cooks Telephone-house phone: Difficulty positioning it near her ear. IPAD: is able to manage IPAD. If she has difficulty with it -Husband assists. Pt. Has difficulty timing the scrolling. Hobbies: Painting, cross stitch.     FUNCTIONAL OUTCOME MEASURES: TBD  UPPER EXTREMITY ROM:   TBD-proximally   ROM Right eval Left eval  Shoulder flexion    Shoulder abduction    Shoulder adduction    Shoulder  extension    Shoulder internal rotation    Shoulder external rotation    Elbow flexion    Elbow extension    Wrist flexion 60(70) 50(60)  Wrist extension 52(60) 64(68)  Wrist ulnar deviation 20 20  Wrist radial deviation 28 42  Wrist pronation    Wrist supination    (Blank rows = not tested)   Bilateral hand ROM:   Eval:   At rest Pt. Presents with flexor tightness keeping her bilateral hands in MP, PIP, and DIP flexion.  Right:  -MCP flexor tightness with active(passive) MCP extension 2nd: 50%(75%), 3rd: 50%(75%) of the range, 4th: 50%(75%), 5th: 25%(50%).  -Full PIP extension -Full fist -Thumb IP hyperextension, limited IP flexion.  Left:  -4th digit swan neck -MCP flexor tightness with active(passive) MCP extension 2nd: full, 3rd: 50%(25%) of the range, 4th: 50%(50%), 5th: 50%(50%).  -Limited 2nd &3rd digit flexion      UPPER EXTREMITY MMT:   TBD Proximally  MMT Right eval Left eval  Shoulder flexion    Shoulder abduction    Shoulder adduction    Shoulder extension    Shoulder internal rotation    Shoulder external rotation    Middle trapezius    Lower trapezius    Elbow flexion    Elbow extension    Wrist flexion    Wrist extension    Wrist ulnar deviation    Wrist radial deviation    Wrist pronation    Wrist supination    (Blank rows = not tested)  HAND FUNCTION:  TBD  COORDINATION: TBD  SENSATION: Light touch: WFL  EDEMA:  N/A  COGNITION: Overall cognitive status: Within functional limits for tasks assessed   TREATMENT DATE: 04/14/24  Manual Therapy:  -Pt. tolerated soft tissue massage in conjunction with moist heat to the bilateral hands at the volar, dorsal, medial and lateral aspect of the bilateral hands and digit MP, PIP, and DIP joints, thumb MP, and IP joint, the thenar, and hypothenar eminence, as well as the 1st dorsal interossei to increase circulation, 2/2 joint stiffness. -Manual therapy was performed prior to and in  conjunction with there. Ex.   There ex:  -AROM/gentle PROM was performed to the bilateral hand digit MP, PIP, DIP extension, as well as thumb radial deviation, and thumb IP extension.  Self-care:  -Pt. education was provided about adaptive and joint protective methods for utensil use using adaptive built-up handle utensils. Pt. has difficulty spearing food with a fork. Pt. Uses an adaptive built-up handle spoon at home.  -  Assessed various handles on pointed, and thicker comb handles.   PATIENT EDUCATION: Education details: OT services, POC, goals and ADL/IADL functional Status.  Person educated: Patient Education method: Explanation, Demonstration, Tactile cues, and Verbal cues Education comprehension: verbalized understanding, returned demonstration, verbal cues required, tactile cues required, and needs further education  HOME EXERCISE PROGRAM:  Continue to assess ongoing need for HEPs, and provide/upgrade as indicated.   GOALS: Goals reviewed with patient? Yes  SHORT TERM GOALS: Target date: 05/19/2024  Pt./caregivers will be independent with HEPs for bilateral hands Baseline: Eval: No current HEP Goal status: INITIAL   LONG TERM GOALS: Target date: 06/30/2024  Pt. Will use utensils with Modified Independence during meals Baseline: Eval: Pt. has difficulty using utensils, and currently uses plastic tableware Goal status: INITIAL  2.  Pt. Will button clothing with modified independence Baseline: Eval: Pt. is unable to fasten buttons  Goal status: INITIAL  4.  Pt. Will demonstrate compensatory/joint protection, and work simplification strategies during ADLs, and IADLs. Baseline: Eval:  Pt. Requires caregiver assist for daily ADL, and IADL care tasks. Goal status: INITIAL  5.  Pt. Will be able to hold and use IPad while efficiently scrolling. Baseline: Eval: Pt. Has difficulty with accurately scrolling efficiently when using the IPad.   Goal status: INITIAL  5.  Pt. Will  use her bilateral hands together to reposition the pillows on her couch with minA. Baseline: Eval: Pt. is unable to reposition the pillows on her couch efficiently with her bilateral hands.  Goal status: INITIAL  ASSESSMENT:  CLINICAL IMPRESSION:  Pt. Reports fatigue 2/2 not sleeping well last night. Pt. continues to tolerate moist heat, STM, and stretches well today without increased Pain, and reports that her hands feel better following. Pt. continues to present with limited digit ROM 2/2 contractures in the bilateral hands. Pt. continues to have difficulty applying the appropriate amount of pressure when using a fork to spear food. Pt. Reports that food falls from the spoon when turning the spoon to bring it to her mouth. Pt. Was able to demonstrate a built-up handled spoon set at a 90 degree angle. Pt. Was able to hold a comb stable with 1 red foam roll, as well as Coban wrap. Pt. continues to benefit from OT services to work towards improving BUE functioning, increasing engagement in ADL/IADL functioning, and maximizing independence with ADLs, and IADLs while decreasing caregiver burden. Pt. will also benefit from education about compensatory strategies, joint protection, and work simplification strategies.  PERFORMANCE DEFICITS: in functional skills including ADLs, IADLs, coordination, dexterity, ROM, strength, pain, Fine motor control, and UE functional use, and psychosocial skills including environmental adaptation, interpersonal interactions, and routines and behaviors.   IMPAIRMENTS: are limiting patient from ADLs, IADLs, and leisure.   COMORBIDITIES: may have co-morbidities  that affects occupational performance. Patient will benefit from skilled OT to address above impairments and improve overall function.  MODIFICATION OR ASSISTANCE TO COMPLETE EVALUATION: Min-Moderate modification of tasks or assist with assess necessary to complete an evaluation.  OT OCCUPATIONAL PROFILE AND  HISTORY: Detailed assessment: Review of records and additional review of physical, cognitive, psychosocial history related to current functional performance.  CLINICAL DECISION MAKING: Moderate - several treatment options, min-mod task modification necessary  REHAB POTENTIAL: Good  EVALUATION COMPLEXITY: Moderate      PLAN:  OT FREQUENCY: 1-2x/week  OT DURATION: 12 weeks  PLANNED INTERVENTIONS: 97168 OT Re-evaluation, 97535 self care/ADL training, 02889 therapeutic exercise, 97530 therapeutic activity, 97112 neuromuscular re-education, 97140 manual therapy, [id number]  paraffin, 97010 moist heat, 97034 contrast bath, 97760 Orthotic Initial, [id number] Orthotic/Prosthetic subsequent, patient/family education, and DME and/or AE instructions  CONSULTED AND AGREED WITH PLAN OF CARE: Patient and family member/caregiver  PLAN FOR NEXT SESSION: Treatment  Richardson Otter, MS, OTR/L   04/14/2024, 4:55 PM   "

## 2024-04-19 ENCOUNTER — Ambulatory Visit: Admitting: Occupational Therapy

## 2024-04-19 DIAGNOSIS — M6281 Muscle weakness (generalized): Secondary | ICD-10-CM

## 2024-04-19 DIAGNOSIS — R278 Other lack of coordination: Secondary | ICD-10-CM

## 2024-04-19 NOTE — Therapy (Addendum)
 " OUTPATIENT OCCUPATIONAL THERAPY ORTHO TREATMENT NOTE  Patient Name: [name] MRN: [id number] DOB:1946/02/08, 79 y.o., female Today's Date: 04/19/2024  PCP: Dr. Ophelia Fernande Mckenzie DOUGLAS, MD REFERRING PROVIDER: Defoor, Elsie HERO., PA-C  END OF SESSION:   OT End of Session - 04/19/24 1555     Visit Number 4    Number of Visits 24    Date for Recertification  06/30/24    OT Start Time 1315    OT Stop Time 1400    OT Time Calculation (min) 45 min    Activity Tolerance Patient tolerated treatment well    Behavior During Therapy [facility] for tasks assessed/performed           Past Medical History:  Diagnosis Date   CHF (congestive heart failure) (HCC)    Collagen vascular disease    CVA (cerebral vascular accident) (HCC)    Depression    Hyperlipidemia    Muscle pain    Neuropathy    RA (rheumatoid arthritis) (HCC)    Thyroid  disease    Vitamin B 12 deficiency    Past Surgical History:  Procedure Laterality Date   ABDOMINAL HYSTERECTOMY     BREAST BIOPSY Left    2 benign biopsies   ECTOPIC PREGNANCY SURGERY     KNEE SURGERY Right    LAMINECTOMY     leg vein stripping Bilateral    TONSILLECTOMY     Patient Active Problem List   Diagnosis Date Noted   Acute hypoxemic respiratory failure (HCC) 01/27/2023   Meningioma (HCC) 09/15/2020   Chronic diastolic CHF (congestive heart failure) (HCC) 09/15/2020   Memory difficulty 08/31/2020   History of stroke 08/31/2020   Falls frequently 08/31/2020   Ataxic dysarthria 08/31/2020   Acute CHF (congestive heart failure) (HCC) 07/15/2020   Hypertension    Anterior epistaxis    Senile purpura 06/15/2020   Ganglion of joint 06/12/2020   Hyperlipidemia 06/12/2020   Hypothyroidism due to acquired atrophy of thyroid  06/12/2020   Recurrent major depressive disorder, in partial remission 06/12/2020   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/12/2020   Sleep apnea 06/12/2020   Elevated troponin 02/06/2020   Chronic respiratory failure with  hypoxia (HCC) 12/22/2018   Immunosuppression 12/22/2018   Pulmonary fibrosis (HCC) 12/21/2018   Bilateral carotid artery disease 07/31/2016   Acute CVA (cerebrovascular accident) (HCC) 07/17/2016   Other abnormal glucose 11/25/2013   Prediabetes 11/25/2013   Rheumatoid arthritis with rheumatoid factor (HCC) 09/20/2013    ONSET DATE: 2011  REFERRING DIAG: RA Bilateral hands  THERAPY DIAG:  Muscle weakness (generalized)  Other lack of coordination  Rationale for Evaluation and Treatment: Rehabilitation  SUBJECTIVE:   SUBJECTIVE STATEMENT: Pt. reports that she went out to dinner with her family in [place] thins past weekend. Pt accompanied by: Husband  PERTINENT HISTORY: Pt. is an 79 y.o. female who was diagnosed with RA approximately 15 years ago per Pt./caregiver report.  Per Pt. Chart review, and last Rheumatology visit:   History of present illness:     [name] is a 79 y.o.female who presents today for follow-up evaluation of active end-stage Stage 4 rheumatoid arthritis with positive rheumatoid factor. She was last seen 10/14/2023 and was doing so-so. We checked a vectra panel which showed very good disease control.   She last got hand x-rays completed 07/2021. She last completed a DEXA scan 2021.   [name] was diagnosed with rheumatoid arthritis with positive rheumatoid factor in her late 67s by [name]. Diagnosis  was supported by positive RF and anti-CCP.   [name] has tried the following rheumatologic medications in the past: - Methotrexate  - current - Enbrel - stopped due to cost - Sulfasalazine - lack of efficacy - Plaquenil - current - Remicade - reaction - Orencia - lack of efficacy - Humira - current - Prednisone - helped in the past, took chronically for a while   She is doing poorly since last visit, noting ongoing pains in her hands and difficulty using her hands. She notes her symptoms have not significantly changed  in their behavior since last visit, nor the shape of her hands or toes. She notes she does have a sore underneath her right 1st toe which closed but has since reopened, and she sees Dr. Lennie for this later today.     [name] is currently being treated with methotrexate  and plaquenil and folic acid  and humira.  She has not experienced side effects from the medication.  She has not had any infections since last office visit.     She has not had any other significant health changes or unexplained new symptoms since last visit.    [name]'s work status is retired. She is a former smoker who smokes 0.25 ppd for 45 years, for a total of 11.25 pack years. She quit smoking 2008. She drinks an average of 0 servings of alcohol  weekly. [name] a family history of RA in her great grandmother and in her two of her cousins but denies any family history of known autoimmune disease otherwise.   Assessment and Plan: Rheumatoid arthritis involving multiple sites with positive rheumatoid factor (CMS/HHS-HCC)  (primary encounter diagnosis) Plan: CBC w/auto Differential (5 Part), Creatinine,        Hepatic Function Panel (HFP), C-Reactive        Protein, Quant - [facility], Sedimentation        Rate-Westergren - [facility], hydroxychloroquine        (PLAQUENIL) 200 mg tablet, Ambulatory Referral        to Occupational Therapy   Encounter for long-term (current) use of high-risk medication   1. Rheumatoid arthritis-severe Stage 3.  Well controlled and Stable.  Significant ongoing pains that appear to be due to existing rheumatoid joint damage and osteoarthritis. Right foot ulcer does not appear infected at present. Podiatry evaluation pending. We will tentatively plan on holding methotrexate  for one month to facilitate healing, while continuing humira therapy unless podiatry see contraindication. I do not see any indication to escalate her DMARD therapy.    2. High risk prescription-This patient is on drug therapy  requiring intensive monitoring for toxicity, including regular lab monitoring and screening for serious or recurrent infections.  Today, I assessed for side effects, infections, new or worsening health conditions that may be related to the medications, and will obtain labs.   Return in about 3 months (around 05/18/2024).    PRECAUTIONS: None  WEIGHT BEARING RESTRICTIONS: No PAIN:   04/19/24: 0/10 pain 04/14/24: 4/10 pain in the bilateral hands 04/12/24: No pain reported Are you having pain? 6/10 in th bilateral hands   FALLS: Has patient fallen in last 6 months? 2-3 falls  LIVING ENVIRONMENT: Lives with: lives with their spouse Lives in: House/apartment Stairs: 2 steps to enter,  2 levels-has slept in recliner the past 2-3 years  Has following equipment at home: Raised commode seat  PLOF: Independent prior to onset of RA  PATIENT GOALS: To get much better  OBJECTIVE:  Note: Objective measures  were completed at Evaluation unless otherwise noted.  HAND DOMINANCE: Right  ADLs:  Personal care aide 4  days a week 10 am-8 pm  Feeding: uses plastic utensils, and a sipping cup-uses with 2 hands, uses a bib 2/2 drooping food Grooming: Independent, requires set-up of toothpaste onto the toothbrush UE Dressing: Has difficulty donning a shirt, and managing buttons. LE Dressing: Unable to perform LE dressing  Toileting : Requires Assist with all toileting care needs Bathing: Husband/Aide assists with sponge bath Driving: Relies an family and friends Home management: Personal care aide assists with all home management tasks Meal Preparation: Personal care aide cooks Telephone-house phone: Difficulty positioning it near her ear. IPAD: is able to manage IPAD. If she has difficulty with it -Husband assists. Pt. Has difficulty timing the scrolling. Hobbies: Painting, cross stitch.     FUNCTIONAL OUTCOME MEASURES: TBD  UPPER EXTREMITY ROM:   TBD-proximally   ROM Right eval  Left eval  Shoulder flexion    Shoulder abduction    Shoulder adduction    Shoulder extension    Shoulder internal rotation    Shoulder external rotation    Elbow flexion    Elbow extension    Wrist flexion 60(70) 50(60)  Wrist extension 52(60) 64(68)  Wrist ulnar deviation 20 20  Wrist radial deviation 28 42  Wrist pronation    Wrist supination    (Blank rows = not tested)   Bilateral hand ROM:   Eval:   At rest Pt. Presents with flexor tightness keeping her bilateral hands in MP, PIP, and DIP flexion.  Right:  -MCP flexor tightness with active(passive) MCP extension 2nd: 50%(75%), 3rd: 50%(75%) of the range, 4th: 50%(75%), 5th: 25%(50%).  -Full PIP extension -Full fist -Thumb IP hyperextension, limited IP flexion.  Left:  -4th digit swan neck -MCP flexor tightness with active(passive) MCP extension 2nd: full, 3rd: 50%(25%) of the range, 4th: 50%(50%), 5th: 50%(50%).  -Limited 2nd &3rd digit flexion      UPPER EXTREMITY MMT:   TBD Proximally  MMT Right eval Left eval  Shoulder flexion    Shoulder abduction    Shoulder adduction    Shoulder extension    Shoulder internal rotation    Shoulder external rotation    Middle trapezius    Lower trapezius    Elbow flexion    Elbow extension    Wrist flexion    Wrist extension    Wrist ulnar deviation    Wrist radial deviation    Wrist pronation    Wrist supination    (Blank rows = not tested)  HAND FUNCTION:  TBD  COORDINATION: TBD  SENSATION: Light touch: WFL  EDEMA:  N/A  COGNITION: Overall cognitive status: Within functional limits for tasks assessed   TREATMENT DATE: 04/19/24  Manual Therapy:  -Pt. tolerated soft tissue massage in conjunction with moist heat to the bilateral hands at the volar, dorsal, medial and lateral aspect of the bilateral hands and digit MP, PIP, and DIP joints, thumb MP, and IP joint, the thenar, and hypothenar eminence, as well as the 1st dorsal interossei to  increase circulation, 2/2 joint stiffness. -Manual therapy was performed prior to and in conjunction with there. Ex.   There ex:  -AROM/gentle PROM was performed to the bilateral hands digit MP, PIP, DIP extension, as well as thumb radial deviation, and thumb IP extension.   Orthotic Fitting:  -Assessed fit for a palm protector for night time use as Pt. reports that her 5th digit presses into  her palm when she sleeps.  -Pt. was provided with a palm protector as well as education about its care, use, and recommended wearing.   PATIENT EDUCATION: Education details: OT services, POC, goals and ADL/IADL functional Status.  Person educated: Patient Education method: Explanation, Demonstration, Tactile cues, and Verbal cues Education comprehension: verbalized understanding, returned demonstration, verbal cues required, tactile cues required, and needs further education  HOME EXERCISE PROGRAM:  Continue to assess ongoing need for HEPs, and provide/upgrade as indicated.   GOALS: Goals reviewed with patient? Yes  SHORT TERM GOALS: Target date: 05/19/2024  Pt./caregivers will be independent with HEPs for bilateral hands Baseline: Eval: No current HEP Goal status: INITIAL   LONG TERM GOALS: Target date: 06/30/2024  Pt. Will use utensils with Modified Independence during meals Baseline: Eval: Pt. has difficulty using utensils, and currently uses plastic tableware Goal status: INITIAL  2.  Pt. Will button clothing with modified independence Baseline: Eval: Pt. is unable to fasten buttons  Goal status: INITIAL  4.  Pt. Will demonstrate compensatory/joint protection, and work simplification strategies during ADLs, and IADLs. Baseline: Eval:  Pt. Requires caregiver assist for daily ADL, and IADL care tasks. Goal status: INITIAL  5.  Pt. Will be able to hold and use IPad while efficiently scrolling. Baseline: Eval: Pt. Has difficulty with accurately scrolling efficiently when using the  IPad.   Goal status: INITIAL  5.  Pt. Will use her bilateral hands together to reposition the pillows on her couch with minA. Baseline: Eval: Pt. is unable to reposition the pillows on her couch efficiently with her bilateral hands.  Goal status: INITIAL  ASSESSMENT:  CLINICAL IMPRESSION:  Pt. tolerated the initial fit for the left hand palm protector for night time wearing 2/2 reports that her 5th digits press into her palm when she sleeps. Pt. could benefit from one for the right side as well once the order arrives. Pt. continues to tolerate ROM, and STM well to the bilateral hands. Pt. continues to present with tightness, however is able to achieve increased digit MP, PI, and DIP extension. Pt. continues to benefit from OT services to work towards improving BUE functioning, increasing engagement in ADL/IADL functioning, and maximizing independence with ADLs, and IADLs while decreasing caregiver burden. Pt. will also benefit from education about compensatory strategies, joint protection, and work simplification strategies.  PERFORMANCE DEFICITS: in functional skills including ADLs, IADLs, coordination, dexterity, ROM, strength, pain, Fine motor control, and UE functional use, and psychosocial skills including environmental adaptation, interpersonal interactions, and routines and behaviors.   IMPAIRMENTS: are limiting patient from ADLs, IADLs, and leisure.   COMORBIDITIES: may have co-morbidities  that affects occupational performance. Patient will benefit from skilled OT to address above impairments and improve overall function.  MODIFICATION OR ASSISTANCE TO COMPLETE EVALUATION: Min-Moderate modification of tasks or assist with assess necessary to complete an evaluation.  OT OCCUPATIONAL PROFILE AND HISTORY: Detailed assessment: Review of records and additional review of physical, cognitive, psychosocial history related to current functional performance.  CLINICAL DECISION MAKING: Moderate  - several treatment options, min-mod task modification necessary  REHAB POTENTIAL: Good  EVALUATION COMPLEXITY: Moderate      PLAN:  OT FREQUENCY: 1-2x/week  OT DURATION: 12 weeks  PLANNED INTERVENTIONS: 97168 OT Re-evaluation, 97535 self care/ADL training, 02889 therapeutic exercise, 97530 therapeutic activity, 97112 neuromuscular re-education, 97140 manual therapy, 97018 paraffin, 02989 moist heat, 97034 contrast bath, 97760 Orthotic Initial, [id number] Orthotic/Prosthetic subsequent, patient/family education, and DME and/or AE instructions  CONSULTED AND AGREED WITH PLAN  OF CARE: Patient and family member/caregiver  PLAN FOR NEXT SESSION: Treatment  Hailley Byers, MS, OTR/L   04/19/2024, 4:00 PM   "

## 2024-04-21 ENCOUNTER — Ambulatory Visit: Admitting: Occupational Therapy

## 2024-04-21 ENCOUNTER — Ambulatory Visit: Admitting: Physician Assistant

## 2024-04-21 ENCOUNTER — Encounter: Payer: Self-pay | Admitting: Physician Assistant

## 2024-04-21 ENCOUNTER — Telehealth: Payer: Self-pay | Admitting: Physician Assistant

## 2024-04-21 VITALS — BP 147/84 | HR 60 | Ht 65.0 in | Wt 131.0 lb

## 2024-04-21 DIAGNOSIS — R3989 Other symptoms and signs involving the genitourinary system: Secondary | ICD-10-CM | POA: Diagnosis not present

## 2024-04-21 DIAGNOSIS — Z7689 Persons encountering health services in other specified circumstances: Secondary | ICD-10-CM

## 2024-04-21 DIAGNOSIS — B379 Candidiasis, unspecified: Secondary | ICD-10-CM

## 2024-04-21 DIAGNOSIS — N390 Urinary tract infection, site not specified: Secondary | ICD-10-CM

## 2024-04-21 DIAGNOSIS — N898 Other specified noninflammatory disorders of vagina: Secondary | ICD-10-CM

## 2024-04-21 LAB — URINALYSIS, COMPLETE
Bilirubin, UA: NEGATIVE
Ketones, UA: NEGATIVE
Nitrite, UA: POSITIVE — AB
Specific Gravity, UA: 1.03 (ref 1.005–1.030)
Urobilinogen, Ur: 0.2 mg/dL (ref 0.2–1.0)
pH, UA: 5.5 (ref 5.0–7.5)

## 2024-04-21 LAB — MICROSCOPIC EXAMINATION
Bacteria, UA: NONE SEEN
WBC, UA: >30 /HPF — AB (ref 0–5)

## 2024-04-21 NOTE — Telephone Encounter (Signed)
 Please call them and let them know that her urinalysis today was stable.  There is evidence of yeast in her urine, which is commonly a sign of antibiotic overuse.  I do not recommend that we treat this finding.  She has had a rather thorough workup in the last 4 months including CT scan, cystoscopy, and urine cytology, all with no significant findings.  At this point, unfortunately, I do not have anything else to offer.  I am happy to offer them a referral to another urologist for second opinion or to gynecology to see if she may have any vaginal concerns contributing to this picture.

## 2024-04-21 NOTE — Progress Notes (Signed)
 "  04/21/2024 5:26 PM   [name] 1945-04-18 [id number]  CC: Chief Complaint  Patient presents with   Recurrent UTI   HPI: [name] is a 79 y.o. female with PMH incomplete bladder emptying, recurrent UTI on Hiprex , prediabetes, pulmonary fibrosis, OSA, diastolic CHF on Farxiga who presents today for evaluation of possible UTI.  She is accompanied today by her caregiver,[name], who contributes to HPI.  I have seen her several times recently with similar bladder complaints.  CTAP without contrast showed no evidence of colovesical fistula.  She also had a cystoscopy with Dr. Francisca that was notable only for mild erythema; urine cytology was benign.  Today she reports dysuria and constant bladder pain without fevers or gross hematuria.  She remains on Hiprex .  She finished Cipro 500 mg twice daily x 7 days a couple of days ago with no relief in her symptoms.  She reports that her bowels move fine, however her caregiver reports that her stools are very hard.  She states that [name] frequently describes feeling like there is something poking her in her bladder.  Catheterized UA was obtained on arrival, but had not yet resulted at the time of her visit.  PMH: Past Medical History:  Diagnosis Date   CHF (congestive heart failure) (HCC)    Collagen vascular disease    CVA (cerebral vascular accident) (HCC)    Depression    Hyperlipidemia    Muscle pain    Neuropathy    RA (rheumatoid arthritis) (HCC)    Thyroid  disease    Vitamin B 12 deficiency     Surgical History: Past Surgical History:  Procedure Laterality Date   ABDOMINAL HYSTERECTOMY     BREAST BIOPSY Left    2 benign biopsies   ECTOPIC PREGNANCY SURGERY     KNEE SURGERY Right    LAMINECTOMY     leg vein stripping Bilateral    TONSILLECTOMY      Home Medications:  Allergies as of 04/21/2024       Reactions   Codeine Nausea And Vomiting, Other (See Comments)   Demerol [meperidine] Nausea And  Vomiting        Medication List        Accurate as of April 21, 2024  5:26 PM. If you have any questions, ask your nurse or doctor.          STOP taking these medications    Farxiga 10 MG Tabs tablet Generic drug: dapagliflozin propanediol Stopped by: Oluchi Pucci   hydroxychloroquine 200 MG tablet Commonly known as: PLAQUENIL Stopped by: Hagen Tidd   ondansetron  4 MG tablet Commonly known as: ZOFRAN  Stopped by: Lucie Hones       TAKE these medications    adalimumab 40 MG/0.8ML prefilled syringe Commonly known as: HUMIRA Inject 40 mg into the skin every 14 (fourteen) days.   albuterol  108 (90 Base) MCG/ACT inhaler Commonly known as: VENTOLIN  HFA Inhale 1-2 puffs into the lungs every 6 (six) hours as needed for wheezing or shortness of breath.   clopidogrel  75 MG tablet Commonly known as: PLAVIX  Take 1 tablet (75 mg total) by mouth daily.   folic acid  1 MG tablet Commonly known as: FOLVITE  Take 1 mg by mouth daily.   furosemide  20 MG tablet Commonly known as: LASIX  Take 20 mg by mouth.   gabapentin  100 MG capsule Commonly known as: NEURONTIN  Take 1 capsule (100 mg total) by mouth at bedtime for 15 days, THEN 1 capsule (100  mg total) 2 (two) times daily for 15 days, THEN 1 capsule (100 mg total) 3 (three) times daily. Start taking on: March 30, 2024   lamoTRIgine  100 MG tablet Commonly known as: LAMICTAL  Take 100 mg by mouth daily.   levothyroxine  112 MCG tablet Commonly known as: SYNTHROID  Take 1 tablet (112 mcg total) by mouth daily.   magic mouthwash (nystatin, hydrocortisone, diphenhydrAMINE, lidocaine ) suspension Swish and spit 5 mLs 4 (four) times daily as needed for mouth pain.   methenamine  1 g tablet Commonly known as: Hiprex  Take 1 tablet (1 g total) by mouth 2 (two) times daily with a meal.   methotrexate  2.5 MG tablet Commonly known as: RHEUMATREX Take 20 mg by mouth every Saturday.    mirtazapine  15 MG tablet Commonly known as: REMERON  Take 15 mg by mouth at bedtime.   nystatin cream Commonly known as: MYCOSTATIN Apply 1 Application topically 2 (two) times daily.   pantoprazole  40 MG tablet Commonly known as: PROTONIX  Take 40 mg by mouth.   potassium chloride  8 MEQ tablet Commonly known as: KLOR-CON  Take 8 mEq by mouth daily.   traMADol  50 MG tablet Commonly known as: ULTRAM  Take 1 tablet (50 mg total) by mouth every 12 (twelve) hours as needed.   venlafaxine  XR 150 MG 24 hr capsule Commonly known as: EFFEXOR -XR Take 150 mg by mouth daily.        Allergies:  Allergies[1]  Family History: Family History  Problem Relation Age of Onset   COPD Father    Cancer Father    Breast cancer Cousin    Breast cancer Maternal Aunt    Breast cancer Maternal Aunt     Social History:   reports that she has never smoked. She has never been exposed to tobacco smoke. She has never used smokeless tobacco. No history on file for alcohol  use and drug use.  Physical Exam: BP (!) 147/84   Pulse 60   Ht 5' 5 (1.651 m)   Wt 131 lb (59.4 kg)   SpO2 96%   BMI 21.80 kg/m   Constitutional:  Alert, no acute distress, nontoxic appearing HEENT: [place], AT Cardiovascular: No clubbing, cyanosis, or edema Respiratory: Normal respiratory effort, no increased work of breathing Skin: No rashes, bruises or suspicious lesions Neurologic: Grossly intact, no focal deficits, moving all 4 extremities Psychiatric: Normal mood and affect  Assessment & Plan:   1. Bladder pain (Primary) Chronic bladder pain with recent benign workup.  Unfortunately, she is a limited historian and I do suspect an element of underlying constipation, unclear to what extent this is contributing to her symptoms.  With her symptoms not improving despite antibiotics, I have doubts that her symptoms are infectious in etiology.  That said, I think remaining on Hiprex  for UTI prevention is reasonable.  I will  call them later today when I have her urine results back so we can discuss plan. - Urinalysis, Complete - CULTURE, URINE COMPREHENSIVE   Return for Will call with results.  Lucie Hones, PA-C  [facility] [place] [address], [address] [place], [place] [address] [phone]     [1]  Allergies Allergen Reactions   Codeine Nausea And Vomiting and Other (See Comments)   Demerol [Meperidine] Nausea And Vomiting   "

## 2024-04-21 NOTE — Patient Instructions (Signed)
Will call with Results

## 2024-04-22 ENCOUNTER — Ambulatory Visit: Admitting: Speech Pathology

## 2024-04-22 ENCOUNTER — Ambulatory Visit: Admitting: Occupational Therapy

## 2024-04-22 DIAGNOSIS — R471 Dysarthria and anarthria: Secondary | ICD-10-CM

## 2024-04-22 DIAGNOSIS — M6281 Muscle weakness (generalized): Secondary | ICD-10-CM

## 2024-04-22 NOTE — Therapy (Unsigned)
 " OUTPATIENT SPEECH LANGUAGE PATHOLOGY EVALUATION ONLY   Patient Name: [name] MRN: [id number] DOB:05-Aug-1945, 79 y.o., female Today's Date: 04/23/2024  PCP: Ophelia Fernande MOULD, MD REFERRING PROVIDER: Elsie Brewster, PA-C     End of Session - 04/22/24 1625     Visit Number 1    SLP Start Time 1530    SLP Stop Time  1605    SLP Time Calculation (min) 35 min    Activity Tolerance Patient tolerated treatment well         No past medical history on file.  The histories are not reviewed yet. Please review them in the History navigator section and refresh this SmartLink. Patient Active Problem List   Diagnosis Date Noted   Acute hypoxemic respiratory failure (HCC) 01/27/2023   Meningioma (HCC) 09/15/2020   Chronic diastolic CHF (congestive heart failure) (HCC) 09/15/2020   Memory difficulty 08/31/2020   History of stroke 08/31/2020   Falls frequently 08/31/2020   Ataxic dysarthria 08/31/2020   Acute CHF (congestive heart failure) (HCC) 07/15/2020   Hypertension    Anterior epistaxis    Senile purpura 06/15/2020   Ganglion of joint 06/12/2020   Hyperlipidemia 06/12/2020   Hypothyroidism due to acquired atrophy of thyroid  06/12/2020   Recurrent major depressive disorder, in partial remission 06/12/2020   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/12/2020   Sleep apnea 06/12/2020   Elevated troponin 02/06/2020   Chronic respiratory failure with hypoxia (HCC) 12/22/2018   Immunosuppression 12/22/2018   Pulmonary fibrosis (HCC) 12/21/2018   Bilateral carotid artery disease 07/31/2016   Acute CVA (cerebrovascular accident) (HCC) 07/17/2016   Other abnormal glucose 11/25/2013   Prediabetes 11/25/2013   Rheumatoid arthritis with rheumatoid factor (HCC) 09/20/2013    ONSET DATE: date of referral  04/14/2024  REFERRING DIAG: R47.1 (ICD-10-CM) - Dysarthria   THERAPY DIAG:  Dysarthria and anarthria  Rationale for Evaluation and Treatment Rehabilitation  SUBJECTIVE:    SUBJECTIVE STATEMENT: My husband gets really agitated when he can't hear me Pt accompanied by: significant other and private caregiver  PERTINENT HISTORY and DIAGNOSTIC FINDINGS: Pt is a 79 year old female with history of recurrent urinary tract infections, pulmonary fibrosis, chronic diastolic heart failure and rheumatoid arthritis, neuropathy, protein-calorie malnutrition.    PAIN:  Are you having pain? No   FALLS: Has patient fallen in last 6 months?  No  LIVING ENVIRONMENT: Lives with: lives with their spouse Lives in: House/apartment  PLOF:  Level of assistance: Needed assistance with ADLs, Needed assistance with IADLS Employment: Retired   PATIENT GOALS      to increase speech loudness  OBJECTIVE:  COGNITION: Overall cognitive status: History of cognitive impairments - at baseline Functional deficits: memory, dependent on husband and caregivers for completion of ADLs and iADLs.  MOTOR SPEECH: Overall motor speech: impaired, at baseline Level of impairment: Word, Phrase, Sentence, and Conversation  ORAL MOTOR EXAMINATION: Facial : WFL Lingual: WFL Velum: WFL Mandible: WFL Cough: WFL Voice: Weak    RESPIRATION: Reduced breath support and Chest-centered breathing  PHONATION:  Voice quality: low vocal intensity   PATIENT EDUCATION: Education details: as below Person educated: Patient, Spouse, and Scientist, Physiological Education method: Explanation Education comprehension: verbalized understanding    ASSESSMENT:  CLINICAL IMPRESSION: Patient is a 79 y.o. female who was seen today for a speech evaluation. Pt is known to ST services (and this clinical research associate) from previous course of skilled ST services targeting increased vocal intensity (07/23/2022 thru 09/25/2022). Pt discharged d/t as she made little progress over the  course of 12 ST sessions. She continues to be reliant on caregiver reminders for increased vocal intensity. Her speech during this session  continues to be unchanged from initial presentation at time of the evaluation c/b low vocal intensity, reduced articulation, low pitch, mono-pitch. Pt's lack of progress is related to multiple factors that ST is unable to impact at this time - lack caregiver participation in sessions, cognitive deficits in memory, awareness and pulmonary function. She continues to rely on cues to improve vocal intensity but frequently becomes SOB during longer utterances. She has reached her maximal benefit is now considered to be at her new baseline. At this time, all education has been completed and pt is independent with RMST exercises.   Her husband was present during today's evaluation. He reports that pt has recently received ST services at skilled nursing facility and thru [facility] services. He states she was discharged in the last 3-4 weeks from [facility] d/t maximal benefit reached.   During this assessment, pt's speech intelligibility continues to be consistent with that of previous course of Outpatient ST services as her pitch today is 114 Hz and her vocal intensity is 64dB. She continues to be at her baseline as established over the course of skilled ST services. Given this and continued difficulty in being heard by others, this write provided information on ways to compensate such as personal amplification. At this time all education has been completed and  St services don't have anything further to offer patient.    REHAB POTENTIAL: Poor recent discharge from HHST d/t pt meeting maximal potential/benefit from services.       Tajay Muzzy B. Rubbie, M.S., CCC-SLP, CBIS Speech-Language Pathologist Certified Brain Injury Specialist [facility]  [facility] [phone] Ascom [phone] Fax [phone]         "

## 2024-04-22 NOTE — Therapy (Signed)
 " OUTPATIENT OCCUPATIONAL THERAPY ORTHO TREATMENT NOTE  Patient Name: [name] MRN: [id number] DOB:07/29/1945, 79 y.o., female Today's Date: 04/22/2024  PCP: Dr. Ophelia Fernande JINNY DOUGLAS, MD REFERRING PROVIDER: Defoor, Elsie HERO., PA-C  END OF SESSION:   OT End of Session - 04/22/24 1717     Visit Number 5    Number of Visits 24    Date for Recertification  06/30/24    OT Start Time 1450    OT Stop Time 1530    OT Time Calculation (min) 40 min    Activity Tolerance Patient tolerated treatment well    Behavior During Therapy [facility] for tasks assessed/performed           Past Medical History:  Diagnosis Date   CHF (congestive heart failure) (HCC)    Collagen vascular disease    CVA (cerebral vascular accident) (HCC)    Depression    Hyperlipidemia    Muscle pain    Neuropathy    RA (rheumatoid arthritis) (HCC)    Thyroid  disease    Vitamin B 12 deficiency    Past Surgical History:  Procedure Laterality Date   ABDOMINAL HYSTERECTOMY     BREAST BIOPSY Left    2 benign biopsies   ECTOPIC PREGNANCY SURGERY     KNEE SURGERY Right    LAMINECTOMY     leg vein stripping Bilateral    TONSILLECTOMY     Patient Active Problem List   Diagnosis Date Noted   Acute hypoxemic respiratory failure (HCC) 01/27/2023   Meningioma (HCC) 09/15/2020   Chronic diastolic CHF (congestive heart failure) (HCC) 09/15/2020   Memory difficulty 08/31/2020   History of stroke 08/31/2020   Falls frequently 08/31/2020   Ataxic dysarthria 08/31/2020   Acute CHF (congestive heart failure) (HCC) 07/15/2020   Hypertension    Anterior epistaxis    Senile purpura 06/15/2020   Ganglion of joint 06/12/2020   Hyperlipidemia 06/12/2020   Hypothyroidism due to acquired atrophy of thyroid  06/12/2020   Recurrent major depressive disorder, in partial remission 06/12/2020   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/12/2020   Sleep apnea 06/12/2020   Elevated troponin 02/06/2020   Chronic respiratory failure with  hypoxia (HCC) 12/22/2018   Immunosuppression 12/22/2018   Pulmonary fibrosis (HCC) 12/21/2018   Bilateral carotid artery disease 07/31/2016   Acute CVA (cerebrovascular accident) (HCC) 07/17/2016   Other abnormal glucose 11/25/2013   Prediabetes 11/25/2013   Rheumatoid arthritis with rheumatoid factor (HCC) 09/20/2013    ONSET DATE: 2011  REFERRING DIAG: RA Bilateral hands  THERAPY DIAG:  Muscle weakness (generalized)  Rationale for Evaluation and Treatment: Rehabilitation  SUBJECTIVE:   SUBJECTIVE STATEMENT: Pt. Reports being tired today Pt accompanied by: Husband  PERTINENT HISTORY: Pt. is an 79 y.o. female who was diagnosed with RA approximately 15 years ago per Pt./caregiver report.  Per Pt. Chart review, and last Rheumatology visit:   History of present illness:     [name] is a 79 y.o.female who presents today for follow-up evaluation of active end-stage Stage 4 rheumatoid arthritis with positive rheumatoid factor. She was last seen 10/14/2023 and was doing so-so. We checked a vectra panel which showed very good disease control.   She last got hand x-rays completed 07/2021. She last completed a DEXA scan 2021.   [name] was diagnosed with rheumatoid arthritis with positive rheumatoid factor in her late 31s by Dr. Maryl. Diagnosis was supported by positive RF and anti-CCP.   [name] has tried the following  rheumatologic medications in the past: - Methotrexate  - current - Enbrel - stopped due to cost - Sulfasalazine - lack of efficacy - Plaquenil - current - Remicade - reaction - Orencia - lack of efficacy - Humira - current - Prednisone - helped in the past, took chronically for a while   She is doing poorly since last visit, noting ongoing pains in her hands and difficulty using her hands. She notes her symptoms have not significantly changed in their behavior since last visit, nor the shape of her hands or toes. She notes she  does have a sore underneath her right 1st toe which closed but has since reopened, and she sees Dr. Lennie for this later today.     [name] is currently being treated with methotrexate  and plaquenil and folic acid  and humira.  She has not experienced side effects from the medication.  She has not had any infections since last office visit.     She has not had any other significant health changes or unexplained new symptoms since last visit.    [name]'s work status is retired. She is a former smoker who smokes 0.25 ppd for 45 years, for a total of 11.25 pack years. She quit smoking 2008. She drinks an average of 0 servings of alcohol  weekly. [name] reports a family history of RA in her great grandmother and in her two of her cousins but denies any family history of known autoimmune disease otherwise.   Assessment and Plan: Rheumatoid arthritis involving multiple sites with positive rheumatoid factor (CMS/HHS-HCC)  (primary encounter diagnosis) Plan: CBC w/auto Differential (5 Part), Creatinine,        Hepatic Function Panel (HFP), C-Reactive        Protein, Quant - [facility], Sedimentation        Rate-Westergren - [facility], hydroxychloroquine        (PLAQUENIL) 200 mg tablet, Ambulatory Referral        to Occupational Therapy   Encounter for long-term (current) use of high-risk medication   1. Rheumatoid arthritis-severe Stage 3.  Well controlled and Stable.  Significant ongoing pains that appear to be due to existing rheumatoid joint damage and osteoarthritis. Right foot ulcer does not appear infected at present. Podiatry evaluation pending. We will tentatively plan on holding methotrexate  for one month to facilitate healing, while continuing humira therapy unless podiatry see contraindication. I do not see any indication to escalate her DMARD therapy.    2. High risk prescription-This patient is on drug therapy requiring intensive monitoring for toxicity, including regular lab monitoring and  screening for serious or recurrent infections.  Today, I assessed for side effects, infections, new or worsening health conditions that may be related to the medications, and will obtain labs.   Return in about 3 months (around 05/18/2024).    PRECAUTIONS: None  WEIGHT BEARING RESTRICTIONS: No PAIN:   04/22/2024: 0/10 04/19/24: 0/10 pain 04/14/24: 4/10 pain in the bilateral hands 04/12/24: No pain reported Are you having pain? 6/10 in th bilateral hands   FALLS: Has patient fallen in last 6 months? 2-3 falls  LIVING ENVIRONMENT: Lives with: lives with their spouse Lives in: House/apartment Stairs: 2 steps to enter,  2 levels-has slept in recliner the past 2-3 years  Has following equipment at home: Raised commode seat  PLOF: Independent prior to onset of RA  PATIENT GOALS: To get much better  OBJECTIVE:  Note: Objective measures were completed at Evaluation unless otherwise noted.  HAND DOMINANCE: Right  ADLs:  Personal care aide 4  days a week 10 am-8 pm  Feeding: uses plastic utensils, and a sipping cup-uses with 2 hands, uses a bib 2/2 drooping food Grooming: Independent, requires set-up of toothpaste onto the toothbrush UE Dressing: Has difficulty donning a shirt, and managing buttons. LE Dressing: Unable to perform LE dressing  Toileting : Requires Assist with all toileting care needs Bathing: Husband/Aide assists with sponge bath Driving: Relies an family and friends Home management: Personal care aide assists with all home management tasks Meal Preparation: Personal care aide cooks Telephone-house phone: Difficulty positioning it near her ear. IPAD: is able to manage IPAD. If she has difficulty with it -Husband assists. Pt. Has difficulty timing the scrolling. Hobbies: Painting, cross stitch.   FUNCTIONAL OUTCOME MEASURES: TBD  UPPER EXTREMITY ROM:   TBD-proximally   ROM Right eval Left eval  Shoulder flexion    Shoulder abduction    Shoulder adduction     Shoulder extension    Shoulder internal rotation    Shoulder external rotation    Elbow flexion    Elbow extension    Wrist flexion 60(70) 50(60)  Wrist extension 52(60) 64(68)  Wrist ulnar deviation 20 20  Wrist radial deviation 28 42  Wrist pronation    Wrist supination    (Blank rows = not tested)   Bilateral hand ROM:   Eval:   At rest Pt. Presents with flexor tightness keeping her bilateral hands in MP, PIP, and DIP flexion.  Right:  -MCP flexor tightness with active(passive) MCP extension 2nd: 50%(75%), 3rd: 50%(75%) of the range, 4th: 50%(75%), 5th: 25%(50%).  -Full PIP extension -Full fist -Thumb IP hyperextension, limited IP flexion.  Left:  -4th digit swan neck -MCP flexor tightness with active(passive) MCP extension 2nd: full, 3rd: 50%(25%) of the range, 4th: 50%(50%), 5th: 50%(50%).  -Limited 2nd &3rd digit flexion      UPPER EXTREMITY MMT:   TBD Proximally  MMT Right eval Left eval  Shoulder flexion    Shoulder abduction    Shoulder adduction    Shoulder extension    Shoulder internal rotation    Shoulder external rotation    Middle trapezius    Lower trapezius    Elbow flexion    Elbow extension    Wrist flexion    Wrist extension    Wrist ulnar deviation    Wrist radial deviation    Wrist pronation    Wrist supination    (Blank rows = not tested)  HAND FUNCTION:  TBD  COORDINATION: TBD  SENSATION: Light touch: WFL  EDEMA:  N/A  COGNITION: Overall cognitive status: Within functional limits for tasks assessed   TREATMENT DATE: 04/22/24  Manual Therapy:  -STM was performed to the bilateral hands at the volar, dorsal, medial and lateral aspect of the bilateral hands and digit MP, PIP, and DIP joints, thumb MP, and IP joint, the thenar, and hypothenar eminence, as well as the 1st dorsal interossei to increase circulation, 2/2 joint stiffness. -Manual therapy was performed prior to and in conjunction with there. Ex.   There  ex:  -AROM/gentle PROM was performed to the bilateral hands digit MP, PIP, DIP extension, as well as thumb radial deviation, and thumb IP extension.  PATIENT EDUCATION: Education details: OT services, POC, goals and ADL/IADL functional Status.  Person educated: Patient Education method: Explanation, Demonstration, Tactile cues, and Verbal cues Education comprehension: verbalized understanding, returned demonstration, verbal cues required, tactile cues required, and needs further education  HOME EXERCISE PROGRAM:  Continue to assess ongoing need for HEPs, and provide/upgrade as indicated.   GOALS: Goals reviewed with patient? Yes  SHORT TERM GOALS: Target date: 05/19/2024  Pt./caregivers will be independent with HEPs for bilateral hands Baseline: Eval: No current HEP Goal status: INITIAL   LONG TERM GOALS: Target date: 06/30/2024  Pt. Will use utensils with Modified Independence during meals Baseline: Eval: Pt. has difficulty using utensils, and currently uses plastic tableware Goal status: INITIAL  2.  Pt. Will button clothing with modified independence Baseline: Eval: Pt. is unable to fasten buttons  Goal status: INITIAL  4.  Pt. Will demonstrate compensatory/joint protection, and work simplification strategies during ADLs, and IADLs. Baseline: Eval:  Pt. Requires caregiver assist for daily ADL, and IADL care tasks. Goal status: INITIAL  5.  Pt. Will be able to hold and use IPad while efficiently scrolling. Baseline: Eval: Pt. Has difficulty with accurately scrolling efficiently when using the IPad.   Goal status: INITIAL  5.  Pt. Will use her bilateral hands together to reposition the pillows on her couch with minA. Baseline: Eval: Pt. is unable to reposition the pillows on her couch efficiently with her bilateral hands.  Goal status: INITIAL  ASSESSMENT:  CLINICAL IMPRESSION:  Pt. continues to tolerate ROM, and STM well to the bilateral hands. Pt. reports that her  hands feel better with therapy. Pt. is able to achieve more extension in her bilateral hands/digits. Pt. continues to present with tightness, however is able to achieve increased digit MP, PIP, and DIP extension. Pt. continues to benefit from OT services to work towards improving BUE functioning, increasing engagement in ADL/IADL functioning, and maximizing independence with ADLs, and IADLs while decreasing caregiver burden. Pt. will also benefit from education about compensatory strategies, joint protection, and work simplification strategies.  PERFORMANCE DEFICITS: in functional skills including ADLs, IADLs, coordination, dexterity, ROM, strength, pain, Fine motor control, and UE functional use, and psychosocial skills including environmental adaptation, interpersonal interactions, and routines and behaviors.   IMPAIRMENTS: are limiting patient from ADLs, IADLs, and leisure.   COMORBIDITIES: may have co-morbidities  that affects occupational performance. Patient will benefit from skilled OT to address above impairments and improve overall function.  MODIFICATION OR ASSISTANCE TO COMPLETE EVALUATION: Min-Moderate modification of tasks or assist with assess necessary to complete an evaluation.  OT OCCUPATIONAL PROFILE AND HISTORY: Detailed assessment: Review of records and additional review of physical, cognitive, psychosocial history related to current functional performance.  CLINICAL DECISION MAKING: Moderate - several treatment options, min-mod task modification necessary  REHAB POTENTIAL: Good  EVALUATION COMPLEXITY: Moderate      PLAN:  OT FREQUENCY: 1-2x/week  OT DURATION: 12 weeks  PLANNED INTERVENTIONS: 97168 OT Re-evaluation, 97535 self care/ADL training, 02889 therapeutic exercise, 97530 therapeutic activity, 97112 neuromuscular re-education, 97140 manual therapy, 97018 paraffin, 02989 moist heat, 97034 contrast bath, 97760 Orthotic Initial, [id number] Orthotic/Prosthetic  subsequent, patient/family education, and DME and/or AE instructions  CONSULTED AND AGREED WITH PLAN OF CARE: Patient and family member/caregiver  PLAN FOR NEXT SESSION: Treatment  Richardson Otter, MS, OTR/L   04/22/2024, 5:26 PM   "

## 2024-04-23 NOTE — Telephone Encounter (Signed)
 1st phone call attempted. Unsuccessful. Call answered and disconnected.

## 2024-04-23 NOTE — Telephone Encounter (Signed)
 2nd phone call attempted. Unsuccessful.

## 2024-04-26 ENCOUNTER — Ambulatory Visit: Admitting: Speech Pathology

## 2024-04-26 ENCOUNTER — Ambulatory Visit: Admitting: Occupational Therapy

## 2024-04-26 LAB — CULTURE, URINE COMPREHENSIVE

## 2024-04-27 ENCOUNTER — Ambulatory Visit: Payer: Self-pay | Admitting: Physician Assistant

## 2024-04-27 NOTE — Addendum Note (Signed)
 Addended by: Ramaj Frangos A on: 04/27/2024 02:29 PM   Modules accepted: Orders

## 2024-04-27 NOTE — Telephone Encounter (Signed)
 Patients husband was informed of the results. Both the patient and her husband stated that they would like a referral to a gynecologist. The patients husband was informed that a referral will be placed and that the gynecology office should reach out to schedule an appointment. The patients husband voiced understanding.

## 2024-04-27 NOTE — Telephone Encounter (Signed)
 Referral place to Gynecology.

## 2024-04-27 NOTE — Addendum Note (Signed)
 Addended byBETHA CORIE PLATER on: 04/27/2024 01:49 PM   Modules accepted: Orders

## 2024-04-28 ENCOUNTER — Ambulatory Visit: Admitting: Occupational Therapy

## 2024-05-03 ENCOUNTER — Ambulatory Visit: Admitting: Occupational Therapy

## 2024-05-03 ENCOUNTER — Ambulatory Visit: Admitting: Speech Pathology

## 2024-05-04 ENCOUNTER — Ambulatory Visit: Admitting: Student in an Organized Health Care Education/Training Program

## 2024-05-04 ENCOUNTER — Encounter: Payer: Self-pay | Admitting: Student in an Organized Health Care Education/Training Program

## 2024-05-04 VITALS — BP 132/57 | HR 66 | Temp 97.7°F | Resp 16 | Ht 65.0 in | Wt 131.9 lb

## 2024-05-04 DIAGNOSIS — M069 Rheumatoid arthritis, unspecified: Secondary | ICD-10-CM | POA: Diagnosis not present

## 2024-05-04 DIAGNOSIS — G894 Chronic pain syndrome: Secondary | ICD-10-CM

## 2024-05-04 DIAGNOSIS — M2550 Pain in unspecified joint: Secondary | ICD-10-CM | POA: Diagnosis not present

## 2024-05-04 MED ORDER — GABAPENTIN 100 MG PO CAPS: 100.0000 mg | ORAL_CAPSULE | Freq: Two times a day (BID) | ORAL | 2 refills | Status: AC

## 2024-05-04 NOTE — Progress Notes (Signed)
 Safety precautions to be maintained throughout the outpatient stay will include: orient to surroundings, keep bed in low position, maintain call bell within reach at all times, provide assistance with transfer out of bed and ambulation.

## 2024-05-05 ENCOUNTER — Ambulatory Visit: Admitting: Speech Pathology

## 2024-05-05 ENCOUNTER — Ambulatory Visit: Admitting: Occupational Therapy

## 2024-05-05 DIAGNOSIS — M6281 Muscle weakness (generalized): Secondary | ICD-10-CM

## 2024-05-05 NOTE — Therapy (Addendum)
 " OUTPATIENT OCCUPATIONAL THERAPY ORTHO TREATMENT NOTE  Patient Name: [name] MRN: [id number] DOB:01-20-1946, 79 y.o., female Today's Date: 05/05/2024  PCP: Dr. Ophelia Fernande [name] REFERRING PROVIDER: Defoor, Elsie HERO., PA-C  END OF SESSION:   OT End of Session - 05/05/24 1635     Visit Number 6    Date for Recertification  06/30/24    OT Start Time 1405    OT Stop Time 1445    OT Time Calculation (min) 40 min    Activity Tolerance Patient tolerated treatment well    Behavior During Therapy [facility] for tasks assessed/performed           Past Medical History:  Diagnosis Date   CHF (congestive heart failure) (HCC)    Collagen vascular disease    CVA (cerebral vascular accident) (HCC)    Depression    Hyperlipidemia    Muscle pain    Neuropathy    RA (rheumatoid arthritis) (HCC)    Thyroid  disease    Vitamin B 12 deficiency    Past Surgical History:  Procedure Laterality Date   ABDOMINAL HYSTERECTOMY     BREAST BIOPSY Left    2 benign biopsies   ECTOPIC PREGNANCY SURGERY     KNEE SURGERY Right    LAMINECTOMY     leg vein stripping Bilateral    TONSILLECTOMY     Patient Active Problem List   Diagnosis Date Noted   Acute hypoxemic respiratory failure (HCC) 01/27/2023   Meningioma (HCC) 09/15/2020   Chronic diastolic CHF (congestive heart failure) (HCC) 09/15/2020   Memory difficulty 08/31/2020   History of stroke 08/31/2020   Falls frequently 08/31/2020   Ataxic dysarthria 08/31/2020   Acute CHF (congestive heart failure) (HCC) 07/15/2020   Hypertension    Anterior epistaxis    Senile purpura 06/15/2020   Ganglion of joint 06/12/2020   Hyperlipidemia 06/12/2020   Hypothyroidism due to acquired atrophy of thyroid  06/12/2020   Recurrent major depressive disorder, in partial remission 06/12/2020   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/12/2020   Sleep apnea 06/12/2020   Elevated troponin 02/06/2020   Chronic respiratory failure with hypoxia (HCC) 12/22/2018    Immunosuppression 12/22/2018   Pulmonary fibrosis (HCC) 12/21/2018   Bilateral carotid artery disease 07/31/2016   Acute CVA (cerebrovascular accident) (HCC) 07/17/2016   Other abnormal glucose 11/25/2013   Prediabetes 11/25/2013   Rheumatoid arthritis with rheumatoid factor (HCC) 09/20/2013    ONSET DATE: 2011  REFERRING DIAG: RA Bilateral hands  THERAPY DIAG:  Muscle weakness (generalized)  Rationale for Evaluation and Treatment: Rehabilitation  SUBJECTIVE:   SUBJECTIVE STATEMENT: Pt. Reports being tired today Pt accompanied by: Husband  PERTINENT HISTORY: Pt. is an 79 y.o. female who was diagnosed with RA approximately 15 years ago per Pt./caregiver report.  Per Pt. Chart review, and last Rheumatology visit:   History of present illness:     [name] is a 79 y.o.female who presents today for follow-up evaluation of active end-stage Stage 4 rheumatoid arthritis with positive rheumatoid factor. [name] was last seen 10/14/2023 and was doing so-so. We checked a vectra panel which showed very good disease control.   [name] last got hand x-rays completed 07/2021. [name] last completed a DEXA scan 2021.   [name] was diagnosed with rheumatoid arthritis with positive rheumatoid factor in her late 14s by Dr. Maryl. Diagnosis was supported by positive RF and anti-CCP.   [name] has tried the following rheumatologic medications in the past: - Methotrexate  -  current - Enbrel - stopped due to cost - Sulfasalazine - lack of efficacy - Plaquenil - current - Remicade - reaction - Orencia - lack of efficacy - Humira - current - Prednisone - helped in the past, took chronically for a while   [name] is doing poorly since last visit, noting ongoing pains in her hands and difficulty using her hands. [name] notes her symptoms have not significantly changed in their behavior since last visit, nor the shape of her hands or toes. [name] notes [name] does have a sore underneath her  right 1st toe which closed but has since reopened, and [name] sees Dr. Lennie for this later today.     [name] is currently being treated with methotrexate  and plaquenil and folic acid  and humira.  [name] has not experienced side effects from the medication.  [name] has not had any infections since last office visit.     [name] has not had any other significant health changes or unexplained new symptoms since last visit.    [name] work status is retired. [name] is a former smoker who smokes 0.25 ppd for 45 years, for a total of 11.25 pack years. [name] quit smoking 2008. [name] drinks an average of 0 servings of alcohol  weekly. [name] reports a family history of RA in her great grandmother and in her two of her cousins but denies any family history of known autoimmune disease otherwise.   Assessment and Plan: Rheumatoid arthritis involving multiple sites with positive rheumatoid factor (CMS/HHS-HCC)  (primary encounter diagnosis) Plan: CBC w/auto Differential (5 Part), Creatinine,        Hepatic Function Panel (HFP), C-Reactive        Protein, Quant - [facility], Sedimentation        Rate-Westergren - [facility], hydroxychloroquine        (PLAQUENIL) 200 mg tablet, Ambulatory Referral        to Occupational Therapy   Encounter for long-term (current) use of high-risk medication   1. Rheumatoid arthritis-severe Stage 3.  Well controlled and Stable.  Significant ongoing pains that appear to be due to existing rheumatoid joint damage and osteoarthritis. Right foot ulcer does not appear infected at present. Podiatry evaluation pending. We will tentatively plan on holding methotrexate  for one month to facilitate healing, while continuing humira therapy unless podiatry see contraindication. I do not see any indication to escalate her DMARD therapy.    2. High risk prescription-This patient is on drug therapy requiring intensive monitoring for toxicity, including regular lab monitoring and screening for serious or recurrent  infections.  Today, I assessed for side effects, infections, new or worsening health conditions that may be related to the medications, and will obtain labs.   Return in about 3 months (around 05/18/2024).    PRECAUTIONS: None  WEIGHT BEARING RESTRICTIONS: No PAIN:   04/22/2024: 0/10 04/19/24: 0/10 pain 04/14/24: 4/10 pain in the bilateral hands 04/12/24: No pain reported Are you having pain? 6/10 in th bilateral hands   FALLS: Has patient fallen in last 6 months? 2-3 falls  LIVING ENVIRONMENT: Lives with: lives with their spouse Lives in: House/apartment Stairs: 2 steps to enter,  2 levels-has slept in recliner the past 2-3 years  Has following equipment at home: Raised commode seat  PLOF: Independent prior to onset of RA  PATIENT GOALS: To get much better  OBJECTIVE:  Note: Objective measures were completed at Evaluation unless otherwise noted.  HAND DOMINANCE: Right  ADLs:  Personal care aide 4  days a week  10 am-8 pm  Feeding: uses plastic utensils, and a sipping cup-uses with 2 hands, uses a bib 2/2 drooping food Grooming: Independent, requires set-up of toothpaste onto the toothbrush UE Dressing: Has difficulty donning a shirt, and managing buttons. LE Dressing: Unable to perform LE dressing  Toileting : Requires Assist with all toileting care needs Bathing: Husband/Aide assists with sponge bath Driving: Relies an family and friends Home management: Personal care aide assists with all home management tasks Meal Preparation: Personal care aide cooks Telephone-house phone: Difficulty positioning it near her ear. IPAD: is able to manage IPAD. If [name] has difficulty with it -Husband assists. Pt. Has difficulty timing the scrolling. Hobbies: Painting, cross stitch.   FUNCTIONAL OUTCOME MEASURES: TBD  UPPER EXTREMITY ROM:   TBD-proximally   ROM Right eval Left eval  Shoulder flexion    Shoulder abduction    Shoulder adduction    Shoulder extension    Shoulder  internal rotation    Shoulder external rotation    Elbow flexion    Elbow extension    Wrist flexion 60(70) 50(60)  Wrist extension 52(60) 64(68)  Wrist ulnar deviation 20 20  Wrist radial deviation 28 42  Wrist pronation    Wrist supination    (Blank rows = not tested)   Bilateral hand ROM:   Eval:   At rest Pt. Presents with flexor tightness keeping her bilateral hands in MP, PIP, and DIP flexion.  Right:  -MCP flexor tightness with active(passive) MCP extension 2nd: 50%(75%), 3rd: 50%(75%) of the range, 4th: 50%(75%), 5th: 25%(50%).  -Full PIP extension -Full fist -Thumb IP hyperextension, limited IP flexion.  Left:  -4th digit swan neck -MCP flexor tightness with active(passive) MCP extension 2nd: full, 3rd: 50%(25%) of the range, 4th: 50%(50%), 5th: 50%(50%).  -Limited 2nd &3rd digit flexion      UPPER EXTREMITY MMT:   TBD Proximally  MMT Right eval Left eval  Shoulder flexion    Shoulder abduction    Shoulder adduction    Shoulder extension    Shoulder internal rotation    Shoulder external rotation    Middle trapezius    Lower trapezius    Elbow flexion    Elbow extension    Wrist flexion    Wrist extension    Wrist ulnar deviation    Wrist radial deviation    Wrist pronation    Wrist supination    (Blank rows = not tested)  HAND FUNCTION:  TBD  COORDINATION: TBD  SENSATION: Light touch: WFL  EDEMA:  N/A  COGNITION: Overall cognitive status: Within functional limits for tasks assessed   TREATMENT DATE: 04/22/24  Manual Therapy:  -STM was performed to the bilateral hands at the volar, dorsal, medial and lateral aspect of the bilateral hands and digit MP, PIP, and DIP joints, thumb MP, and IP joint, the thenar, and hypothenar eminence, as well as the 1st dorsal interossei to increase circulation, 2/2 joint stiffness. -Manual therapy was performed prior to and in conjunction with there. Ex.   There ex:  -AROM/gentle PROM was  performed to the bilateral hands digit MP, PIP, DIP extension, as well as thumb radial deviation, and thumb IP extension.  PATIENT EDUCATION: Education details: OT services, POC, goals and ADL/IADL functional Status.  Person educated: Patient Education method: Explanation, Demonstration, Tactile cues, and Verbal cues Education comprehension: verbalized understanding, returned demonstration, verbal cues required, tactile cues required, and needs further education  HOME EXERCISE PROGRAM:  Continue to assess ongoing need for HEPs, and  provide/upgrade as indicated.   GOALS: Goals reviewed with patient? Yes  SHORT TERM GOALS: Target date: 05/19/2024  Pt./caregivers will be independent with HEPs for bilateral hands Baseline: Eval: No current HEP Goal status: INITIAL   LONG TERM GOALS: Target date: 06/30/2024  Pt. Will use utensils with Modified Independence during meals Baseline: Eval: Pt. has difficulty using utensils, and currently uses plastic tableware Goal status: INITIAL  2.  Pt. Will button clothing with modified independence Baseline: Eval: Pt. is unable to fasten buttons  Goal status: INITIAL  4.  Pt. Will demonstrate compensatory/joint protection, and work simplification strategies during ADLs, and IADLs. Baseline: Eval:  Pt. Requires caregiver assist for daily ADL, and IADL care tasks. Goal status: INITIAL  5.  Pt. Will be able to hold and use IPad while efficiently scrolling. Baseline: Eval: Pt. Has difficulty with accurately scrolling efficiently when using the IPad.   Goal status: INITIAL  5.  Pt. Will use her bilateral hands together to reposition the pillows on her couch with minA. Baseline: Eval: Pt. is unable to reposition the pillows on her couch efficiently with her bilateral hands.  Goal status: INITIAL  ASSESSMENT:  CLINICAL IMPRESSION:  Pt. continues to tolerate ROM, and STM well to the bilateral hands. Pt. reports that her hands feel better with therapy.  Pt. is able to achieve more extension in her bilateral hands/digits. Pt. continues to present with tightness, however is able to achieve increased digit MP, PIP, and DIP extension. Pt. continues to benefit from OT services to work towards improving BUE functioning, increasing engagement in ADL/IADL functioning, and maximizing independence with ADLs, and IADLs while decreasing caregiver burden. Pt. will also benefit from education about compensatory strategies, joint protection, and work simplification strategies.  PERFORMANCE DEFICITS: in functional skills including ADLs, IADLs, coordination, dexterity, ROM, strength, pain, Fine motor control, and UE functional use, and psychosocial skills including environmental adaptation, interpersonal interactions, and routines and behaviors.   IMPAIRMENTS: are limiting patient from ADLs, IADLs, and leisure.   COMORBIDITIES: may have co-morbidities  that affects occupational performance. Patient will benefit from skilled OT to address above impairments and improve overall function.  MODIFICATION OR ASSISTANCE TO COMPLETE EVALUATION: Min-Moderate modification of tasks or assist with assess necessary to complete an evaluation.  OT OCCUPATIONAL PROFILE AND HISTORY: Detailed assessment: Review of records and additional review of physical, cognitive, psychosocial history related to current functional performance.  CLINICAL DECISION MAKING: Moderate - several treatment options, min-mod task modification necessary  REHAB POTENTIAL: Good  EVALUATION COMPLEXITY: Moderate      PLAN:  OT FREQUENCY: 1-2x/week  OT DURATION: 12 weeks  PLANNED INTERVENTIONS: 97168 OT Re-evaluation, 97535 self care/ADL training, 02889 therapeutic exercise, 97530 therapeutic activity, 97112 neuromuscular re-education, 97140 manual therapy, 97018 paraffin, 02989 moist heat, 97034 contrast bath, 97760 Orthotic Initial, [id number] Orthotic/Prosthetic subsequent, patient/family education, and  DME and/or AE instructions  CONSULTED AND AGREED WITH PLAN OF CARE: Patient and family member/caregiver  PLAN FOR NEXT SESSION: Treatment  Richardson Otter, MS, OTR/L   05/05/2024, 5:05 PM   OUTPATIENT OCCUPATIONAL THERAPY ORTHO TREATMENT NOTE  Patient Name: [name] MRN: [id number] DOB:1946-01-19, 79 y.o., female Today's Date: 05/05/2024  PCP: Dr. Ophelia Fernande [name] REFERRING PROVIDER: Defoor, Elsie HERO., PA-C  END OF SESSION:   OT End of Session - 05/05/24 1635     Visit Number 6    Date for Recertification  06/30/24    OT Start Time 1405    OT Stop Time 1445  OT Time Calculation (min) 40 min    Activity Tolerance Patient tolerated treatment well    Behavior During Therapy WFL for tasks assessed/performed           Past Medical History:  Diagnosis Date   CHF (congestive heart failure) (HCC)    Collagen vascular disease    CVA (cerebral vascular accident) (HCC)    Depression    Hyperlipidemia    Muscle pain    Neuropathy    RA (rheumatoid arthritis) (HCC)    Thyroid  disease    Vitamin B 12 deficiency    Past Surgical History:  Procedure Laterality Date   ABDOMINAL HYSTERECTOMY     BREAST BIOPSY Left    2 benign biopsies   ECTOPIC PREGNANCY SURGERY     KNEE SURGERY Right    LAMINECTOMY     leg vein stripping Bilateral    TONSILLECTOMY     Patient Active Problem List   Diagnosis Date Noted   Acute hypoxemic respiratory failure (HCC) 01/27/2023   Meningioma (HCC) 09/15/2020   Chronic diastolic CHF (congestive heart failure) (HCC) 09/15/2020   Memory difficulty 08/31/2020   History of stroke 08/31/2020   Falls frequently 08/31/2020   Ataxic dysarthria 08/31/2020   Acute CHF (congestive heart failure) (HCC) 07/15/2020   Hypertension    Anterior epistaxis    Senile purpura 06/15/2020   Ganglion of joint 06/12/2020   Hyperlipidemia 06/12/2020   Hypothyroidism due to acquired atrophy of thyroid  06/12/2020   Recurrent major depressive  disorder, in partial remission 06/12/2020   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/12/2020   Sleep apnea 06/12/2020   Elevated troponin 02/06/2020   Chronic respiratory failure with hypoxia (HCC) 12/22/2018   Immunosuppression 12/22/2018   Pulmonary fibrosis (HCC) 12/21/2018   Bilateral carotid artery disease 07/31/2016   Acute CVA (cerebrovascular accident) (HCC) 07/17/2016   Other abnormal glucose 11/25/2013   Prediabetes 11/25/2013   Rheumatoid arthritis with rheumatoid factor (HCC) 09/20/2013    ONSET DATE: 2011  REFERRING DIAG: RA Bilateral hands  THERAPY DIAG:  Muscle weakness (generalized)  Rationale for Evaluation and Treatment: Rehabilitation  SUBJECTIVE:   SUBJECTIVE STATEMENT: Pt. reports being tired today Pt accompanied by: Husband  PERTINENT HISTORY: Pt. is an 79 y.o. female who was diagnosed with RA approximately 15 years ago per Pt./caregiver report.  Per Pt. Chart review, and last Rheumatology visit:   History of present illness:     Teasha Murrillo is a 79 y.o.female who presents today for follow-up evaluation of active end-stage Stage 4 rheumatoid arthritis with positive rheumatoid factor. [name] was last seen 10/14/2023 and was doing so-so. We checked a vectra panel which showed very good disease control.   [name] last got hand x-rays completed 07/2021. [name] last completed a DEXA scan 2021.   Marquise Lambson was diagnosed with rheumatoid arthritis with positive rheumatoid factor in her late 75s by Dr. Maryl. Diagnosis was supported by positive RF and anti-CCP.   Tambria Pfannenstiel has tried the following rheumatologic medications in the past: - Methotrexate  - current - Enbrel - stopped due to cost - Sulfasalazine - lack of efficacy - Plaquenil - current - Remicade - reaction - Orencia - lack of efficacy - Humira - current - Prednisone - helped in the past, took chronically for a while   [name] is doing poorly since last visit, noting ongoing pains in her  hands and difficulty using her hands. [name] notes her symptoms have not significantly changed in their behavior since last visit, nor  the shape of her hands or toes. [name] notes [name] does have a sore underneath her right 1st toe which closed but has since reopened, and [name] sees Dr. Lennie for this later today.     Ms. Autrey is currently being treated with methotrexate  and plaquenil and folic acid  and humira.  [name] has not experienced side effects from the medication.  [name] has not had any infections since last office visit.     [name] has not had any other significant health changes or unexplained new symptoms since last visit.    Cresta's work status is retired. [name] is a former smoker who smokes 0.25 ppd for 45 years, for a total of 11.25 pack years. [name] quit smoking 2008. [name] drinks an average of 0 servings of alcohol  weekly. Dede reports a family history of RA in her great grandmother and in her two of her cousins but denies any family history of known autoimmune disease otherwise.   Assessment and Plan: Rheumatoid arthritis involving multiple sites with positive rheumatoid factor (CMS/HHS-HCC)  (primary encounter diagnosis) Plan: CBC w/auto Differential (5 Part), Creatinine,        Hepatic Function Panel (HFP), C-Reactive        Protein, Quant - [facility], Sedimentation        Rate-Westergren - [facility], hydroxychloroquine        (PLAQUENIL) 200 mg tablet, Ambulatory Referral        to Occupational Therapy   Encounter for long-term (current) use of high-risk medication   1. Rheumatoid arthritis-severe Stage 3.  Well controlled and Stable.  Significant ongoing pains that appear to be due to existing rheumatoid joint damage and osteoarthritis. Right foot ulcer does not appear infected at present. Podiatry evaluation pending. We will tentatively plan on holding methotrexate  for one month to facilitate healing, while continuing humira therapy unless podiatry see contraindication. I do not see any indication to  escalate her DMARD therapy.    2. High risk prescription-This patient is on drug therapy requiring intensive monitoring for toxicity, including regular lab monitoring and screening for serious or recurrent infections.  Today, I assessed for side effects, infections, new or worsening health conditions that may be related to the medications, and will obtain labs.   Return in about 3 months (around 05/18/2024).    PRECAUTIONS: None  WEIGHT BEARING RESTRICTIONS: No PAIN:    05/05/24: 8/10-bilateral 2 & 3rd digits 04/22/2024: 0/10 04/19/24: 0/10 pain 04/14/24: 4/10 pain in the bilateral hands 04/12/24: No pain reported Are you having pain? 6/10 in th bilateral hands   FALLS: Has patient fallen in last 6 months? 2-3 falls  LIVING ENVIRONMENT: Lives with: lives with their spouse Lives in: House/apartment Stairs: 2 steps to enter,  2 levels-has slept in recliner the past 2-3 years  Has following equipment at home: Raised commode seat  PLOF: Independent prior to onset of RA  PATIENT GOALS: To get much better  OBJECTIVE:  Note: Objective measures were completed at Evaluation unless otherwise noted.  HAND DOMINANCE: Right  ADLs:  Personal care aide 4  days a week 10 am-8 pm  Feeding: uses plastic utensils, and a sipping cup-uses with 2 hands, uses a bib 2/2 drooping food Grooming: Independent, requires set-up of toothpaste onto the toothbrush UE Dressing: Has difficulty donning a shirt, and managing buttons. LE Dressing: Unable to perform LE dressing  Toileting : Requires Assist with all toileting care needs Bathing: Husband/Aide assists with sponge bath Driving: Relies an family and friends Home management: Personal care  aide assists with all home management tasks Meal Preparation: Personal care aide cooks Telephone-house phone: Difficulty positioning it near her ear. IPAD: is able to manage IPAD. If [name] has difficulty with it -Husband assists. Pt. Has difficulty timing the  scrolling. Hobbies: Painting, cross stitch.   FUNCTIONAL OUTCOME MEASURES: TBD  UPPER EXTREMITY ROM:   TBD-proximally   ROM Right eval Left eval  Shoulder flexion    Shoulder abduction    Shoulder adduction    Shoulder extension    Shoulder internal rotation    Shoulder external rotation    Elbow flexion    Elbow extension    Wrist flexion 60(70) 50(60)  Wrist extension 52(60) 64(68)  Wrist ulnar deviation 20 20  Wrist radial deviation 28 42  Wrist pronation    Wrist supination    (Blank rows = not tested)   Bilateral hand ROM:   Eval:   At rest Pt. Presents with flexor tightness keeping her bilateral hands in MP, PIP, and DIP flexion.  Right:  -MCP flexor tightness with active(passive) MCP extension 2nd: 50%(75%), 3rd: 50%(75%) of the range, 4th: 50%(75%), 5th: 25%(50%).  -Full PIP extension -Full fist -Thumb IP hyperextension, limited IP flexion.  Left:  -4th digit swan neck -MCP flexor tightness with active(passive) MCP extension 2nd: full, 3rd: 50%(25%) of the range, 4th: 50%(50%), 5th: 50%(50%).  -Limited 2nd &3rd digit flexion      UPPER EXTREMITY MMT:   TBD Proximally  MMT Right eval Left eval  Shoulder flexion    Shoulder abduction    Shoulder adduction    Shoulder extension    Shoulder internal rotation    Shoulder external rotation    Middle trapezius    Lower trapezius    Elbow flexion    Elbow extension    Wrist flexion    Wrist extension    Wrist ulnar deviation    Wrist radial deviation    Wrist pronation    Wrist supination    (Blank rows = not tested)  HAND FUNCTION:  TBD  COORDINATION: TBD  SENSATION: Light touch: WFL  EDEMA:  N/A  COGNITION: Overall cognitive status: Within functional limits for tasks assessed   TREATMENT DATE: 03/04/25  Manual Therapy:  -STM was performed to the bilateral hands at the volar, dorsal, medial and lateral aspect of the bilateral hands and digit MP, PIP, and DIP joints, thumb  MP, and IP joint, the thenar, and hypothenar eminence, as well as the 1st dorsal interossei to increase circulation, 2/2 joint stiffness. -Manual therapy was performed prior to and in conjunction with there. Ex.   There ex:  -AROM/gentle PROM was performed to the bilateral hands digit MP, PIP, DIP extension, as well as thumb radial deviation, and thumb IP extension.  Therapeutic Act.:  -Facilitated simulated fork use with focus on spearing with against resistance using light blue resistive Theraputty, progressing to pink medium resistive Theraputty.  PATIENT EDUCATION: Education details: OT services, POC, goals and ADL/IADL functional Status.  Person educated: Patient Education method: Explanation, Demonstration, Tactile cues, and Verbal cues Education comprehension: verbalized understanding, returned demonstration, verbal cues required, tactile cues required, and needs further education  HOME EXERCISE PROGRAM:  Continue to assess ongoing need for HEPs, and provide/upgrade as indicated.   GOALS: Goals reviewed with patient? Yes  SHORT TERM GOALS: Target date: 05/19/2024  Pt./caregivers will be independent with HEPs for bilateral hands Baseline: Eval: No current HEP Goal status: INITIAL   LONG TERM GOALS: Target date: 06/30/2024  Pt. Will  use utensils with Modified Independence during meals Baseline: Eval: Pt. has difficulty using utensils, and currently uses plastic tableware Goal status: INITIAL  2.  Pt. Will button clothing with modified independence Baseline: Eval: Pt. is unable to fasten buttons  Goal status: INITIAL  4.  Pt. Will demonstrate compensatory/joint protection, and work simplification strategies during ADLs, and IADLs. Baseline: Eval:  Pt. Requires caregiver assist for daily ADL, and IADL care tasks. Goal status: INITIAL  5.  Pt. Will be able to hold and use IPad while efficiently scrolling. Baseline: Eval: Pt. Has difficulty with accurately scrolling  efficiently when using the IPad.   Goal status: INITIAL  5.  Pt. Will use her bilateral hands together to reposition the pillows on her couch with minA. Baseline: Eval: Pt. is unable to reposition the pillows on her couch efficiently with her bilateral hands.  Goal status: INITIAL  ASSESSMENT:  CLINICAL IMPRESSION:  Pt. reports having 8/10 pain ine bilateral 2nd & 3rd digits today. Pt. tolerated ROM, and STM well to the bilateral hands with less pain following. Pt. reports that her hands feel better with therapy. Pt. Continues to be able to achieve more extension in her bilateral hands/digits. Pt. continues to present with tightness, however is able to achieve increased digit MP, PIP, and DIP extension. Pt. was able to stabilize the fork in the right hand while spearing the light resistive theraputty. Pt. continues to benefit from OT services to work towards improving BUE functioning, increasing engagement in ADL/IADL functioning, and maximizing independence with ADLs, and IADLs while decreasing caregiver burden. Pt. will also benefit from education about compensatory strategies, joint protection, and work simplification strategies.  PERFORMANCE DEFICITS: in functional skills including ADLs, IADLs, coordination, dexterity, ROM, strength, pain, Fine motor control, and UE functional use, and psychosocial skills including environmental adaptation, interpersonal interactions, and routines and behaviors.   IMPAIRMENTS: are limiting patient from ADLs, IADLs, and leisure.   COMORBIDITIES: may have co-morbidities  that affects occupational performance. Patient will benefit from skilled OT to address above impairments and improve overall function.  MODIFICATION OR ASSISTANCE TO COMPLETE EVALUATION: Min-Moderate modification of tasks or assist with assess necessary to complete an evaluation.  OT OCCUPATIONAL PROFILE AND HISTORY: Detailed assessment: Review of records and additional review of physical,  cognitive, psychosocial history related to current functional performance.  CLINICAL DECISION MAKING: Moderate - several treatment options, min-mod task modification necessary  REHAB POTENTIAL: Good  EVALUATION COMPLEXITY: Moderate      PLAN:  OT FREQUENCY: 1-2x/week  OT DURATION: 12 weeks  PLANNED INTERVENTIONS: 97168 OT Re-evaluation, 97535 self care/ADL training, 02889 therapeutic exercise, 97530 therapeutic activity, 97112 neuromuscular re-education, 97140 manual therapy, 97018 paraffin, 02989 moist heat, 97034 contrast bath, 97760 Orthotic Initial, [id number] Orthotic/Prosthetic subsequent, patient/family education, and DME and/or AE instructions  CONSULTED AND AGREED WITH PLAN OF CARE: Patient and family member/caregiver  PLAN FOR NEXT SESSION: Treatment  Richardson Otter, MS, OTR/L   05/05/2024, 5:05 PM   "

## 2024-05-10 ENCOUNTER — Ambulatory Visit: Admitting: Occupational Therapy

## 2024-05-10 ENCOUNTER — Ambulatory Visit: Admitting: Speech Pathology

## 2024-05-12 ENCOUNTER — Ambulatory Visit: Admitting: Speech Pathology

## 2024-05-12 ENCOUNTER — Ambulatory Visit: Admitting: Occupational Therapy

## 2024-05-17 ENCOUNTER — Ambulatory Visit: Admitting: Speech Pathology

## 2024-05-17 ENCOUNTER — Ambulatory Visit: Admitting: Occupational Therapy

## 2024-05-19 ENCOUNTER — Ambulatory Visit: Admitting: Occupational Therapy

## 2024-05-19 ENCOUNTER — Ambulatory Visit: Admitting: Speech Pathology

## 2024-05-24 ENCOUNTER — Ambulatory Visit: Admitting: Occupational Therapy

## 2024-05-24 ENCOUNTER — Ambulatory Visit: Admitting: Speech Pathology

## 2024-05-26 ENCOUNTER — Ambulatory Visit: Admitting: Occupational Therapy

## 2024-05-26 ENCOUNTER — Ambulatory Visit: Admitting: Speech Pathology

## 2024-05-31 ENCOUNTER — Ambulatory Visit: Admitting: Occupational Therapy

## 2024-05-31 ENCOUNTER — Ambulatory Visit: Admitting: Speech Pathology

## 2024-06-02 ENCOUNTER — Ambulatory Visit: Admitting: Occupational Therapy

## 2024-06-02 ENCOUNTER — Ambulatory Visit: Admitting: Speech Pathology

## 2024-06-07 ENCOUNTER — Ambulatory Visit: Admitting: Occupational Therapy

## 2024-06-07 ENCOUNTER — Ambulatory Visit: Admitting: Speech Pathology

## 2024-06-09 ENCOUNTER — Ambulatory Visit: Admitting: Occupational Therapy

## 2024-06-09 ENCOUNTER — Ambulatory Visit: Admitting: Speech Pathology

## 2024-06-14 ENCOUNTER — Ambulatory Visit: Admitting: Speech Pathology

## 2024-06-14 ENCOUNTER — Ambulatory Visit: Admitting: Occupational Therapy

## 2024-06-16 ENCOUNTER — Ambulatory Visit: Admitting: Speech Pathology

## 2024-06-16 ENCOUNTER — Ambulatory Visit: Admitting: Occupational Therapy

## 2024-06-21 ENCOUNTER — Ambulatory Visit: Admitting: Occupational Therapy

## 2024-06-21 ENCOUNTER — Ambulatory Visit: Admitting: Speech Pathology

## 2024-06-23 ENCOUNTER — Ambulatory Visit: Admitting: Occupational Therapy

## 2024-06-23 ENCOUNTER — Ambulatory Visit: Admitting: Speech Pathology

## 2024-06-28 ENCOUNTER — Ambulatory Visit: Admitting: Occupational Therapy

## 2024-06-28 ENCOUNTER — Ambulatory Visit: Admitting: Speech Pathology

## 2024-06-30 ENCOUNTER — Ambulatory Visit: Admitting: Speech Pathology

## 2024-06-30 ENCOUNTER — Ambulatory Visit: Admitting: Occupational Therapy

## 2024-07-05 ENCOUNTER — Ambulatory Visit: Admitting: Occupational Therapy

## 2024-07-05 ENCOUNTER — Ambulatory Visit: Admitting: Speech Pathology

## 2024-07-07 ENCOUNTER — Ambulatory Visit: Admitting: Speech Pathology

## 2024-07-07 ENCOUNTER — Ambulatory Visit: Admitting: Occupational Therapy

## 2024-07-09 ENCOUNTER — Encounter: Admitting: Certified Nurse Midwife

## 2024-07-12 ENCOUNTER — Ambulatory Visit: Admitting: Occupational Therapy

## 2024-07-12 ENCOUNTER — Ambulatory Visit: Admitting: Speech Pathology

## 2024-07-14 ENCOUNTER — Ambulatory Visit: Admitting: Speech Pathology

## 2024-07-14 ENCOUNTER — Ambulatory Visit: Admitting: Occupational Therapy

## 2024-07-19 ENCOUNTER — Ambulatory Visit: Admitting: Speech Pathology

## 2024-07-21 ENCOUNTER — Ambulatory Visit: Admitting: Occupational Therapy

## 2024-07-21 ENCOUNTER — Ambulatory Visit: Admitting: Speech Pathology

## 2024-07-26 ENCOUNTER — Ambulatory Visit: Admitting: Speech Pathology

## 2024-07-28 ENCOUNTER — Ambulatory Visit: Admitting: Speech Pathology

## 2024-07-29 ENCOUNTER — Encounter: Admitting: Student in an Organized Health Care Education/Training Program

## 2024-10-13 ENCOUNTER — Encounter (INDEPENDENT_AMBULATORY_CARE_PROVIDER_SITE_OTHER)

## 2024-10-13 ENCOUNTER — Ambulatory Visit (INDEPENDENT_AMBULATORY_CARE_PROVIDER_SITE_OTHER): Admitting: Nurse Practitioner
